# Patient Record
Sex: Male | Born: 1945 | Race: White | Hispanic: No | Marital: Single | Smoking: Current some day smoker
Health system: Southern US, Community
[De-identification: ages and names within clinical notes are randomized; demographics above are authoritative.]

## PROBLEM LIST (undated history)

## (undated) DIAGNOSIS — C801 Malignant (primary) neoplasm, unspecified: Secondary | ICD-10-CM

## (undated) DIAGNOSIS — M549 Dorsalgia, unspecified: Secondary | ICD-10-CM

## (undated) DIAGNOSIS — K219 Gastro-esophageal reflux disease without esophagitis: Secondary | ICD-10-CM

## (undated) DIAGNOSIS — Z923 Personal history of irradiation: Secondary | ICD-10-CM

## (undated) DIAGNOSIS — C7951 Secondary malignant neoplasm of bone: Secondary | ICD-10-CM

## (undated) DIAGNOSIS — I10 Essential (primary) hypertension: Secondary | ICD-10-CM

## (undated) HISTORY — PX: TOTAL HIP ARTHROPLASTY: SHX124

## (undated) HISTORY — PX: OTHER SURGICAL HISTORY: SHX169

## (undated) HISTORY — DX: Secondary malignant neoplasm of bone: C79.51

## (undated) HISTORY — DX: Personal history of irradiation: Z92.3

---

## 2004-12-19 ENCOUNTER — Ambulatory Visit: Payer: Self-pay | Admitting: Internal Medicine

## 2004-12-19 ENCOUNTER — Inpatient Hospital Stay (HOSPITAL_COMMUNITY): Admission: EM | Admit: 2004-12-19 | Discharge: 2004-12-26 | Payer: Self-pay | Admitting: *Deleted

## 2004-12-24 ENCOUNTER — Encounter (INDEPENDENT_AMBULATORY_CARE_PROVIDER_SITE_OTHER): Payer: Self-pay | Admitting: Specialist

## 2005-01-07 ENCOUNTER — Ambulatory Visit: Payer: Self-pay | Admitting: Internal Medicine

## 2005-07-15 ENCOUNTER — Emergency Department (HOSPITAL_COMMUNITY): Admission: EM | Admit: 2005-07-15 | Discharge: 2005-07-15 | Payer: Self-pay | Admitting: Emergency Medicine

## 2005-07-16 ENCOUNTER — Ambulatory Visit: Payer: Self-pay | Admitting: Family Medicine

## 2005-07-22 ENCOUNTER — Ambulatory Visit: Payer: Self-pay | Admitting: *Deleted

## 2005-08-06 ENCOUNTER — Inpatient Hospital Stay (HOSPITAL_COMMUNITY): Admission: EM | Admit: 2005-08-06 | Discharge: 2005-08-13 | Payer: Self-pay | Admitting: Emergency Medicine

## 2005-08-06 ENCOUNTER — Encounter: Payer: Self-pay | Admitting: Vascular Surgery

## 2005-08-10 ENCOUNTER — Encounter (INDEPENDENT_AMBULATORY_CARE_PROVIDER_SITE_OTHER): Payer: Self-pay | Admitting: Specialist

## 2005-08-18 ENCOUNTER — Ambulatory Visit: Payer: Self-pay | Admitting: Family Medicine

## 2005-11-05 ENCOUNTER — Ambulatory Visit: Payer: Self-pay | Admitting: Family Medicine

## 2006-09-28 ENCOUNTER — Ambulatory Visit: Payer: Self-pay | Admitting: Internal Medicine

## 2006-09-28 LAB — CONVERTED CEMR LAB
Albumin: 4.6 g/dL (ref 3.5–5.2)
CO2: 25 meq/L (ref 19–32)
Cholesterol: 172 mg/dL (ref 0–200)
Eosinophils Relative: 1 % (ref 0–5)
Glucose, Bld: 156 mg/dL — ABNORMAL HIGH (ref 70–99)
HCT: 47.9 % (ref 39.0–52.0)
Lymphocytes Relative: 27 % (ref 12–46)
Lymphs Abs: 2.7 10*3/uL (ref 0.7–3.3)
Platelets: 282 10*3/uL (ref 150–400)
Potassium: 4.4 meq/L (ref 3.5–5.3)
RBC: 5.19 M/uL (ref 4.22–5.81)
Sodium: 137 meq/L (ref 135–145)
Total Protein: 7.3 g/dL (ref 6.0–8.3)
Triglycerides: 116 mg/dL (ref <150)
WBC: 9.8 10*3/uL (ref 4.0–10.5)

## 2007-04-24 ENCOUNTER — Inpatient Hospital Stay (HOSPITAL_COMMUNITY): Admission: EM | Admit: 2007-04-24 | Discharge: 2007-05-05 | Payer: Self-pay | Admitting: Emergency Medicine

## 2007-04-28 ENCOUNTER — Ambulatory Visit: Payer: Self-pay | Admitting: Psychiatry

## 2007-05-12 ENCOUNTER — Inpatient Hospital Stay (HOSPITAL_COMMUNITY): Admission: EM | Admit: 2007-05-12 | Discharge: 2007-05-30 | Payer: Self-pay | Admitting: Emergency Medicine

## 2007-05-12 ENCOUNTER — Ambulatory Visit: Payer: Self-pay | Admitting: Emergency Medicine

## 2008-03-13 ENCOUNTER — Encounter (INDEPENDENT_AMBULATORY_CARE_PROVIDER_SITE_OTHER): Payer: Self-pay | Admitting: Adult Health

## 2008-03-13 ENCOUNTER — Ambulatory Visit: Payer: Self-pay | Admitting: Internal Medicine

## 2008-03-13 LAB — CONVERTED CEMR LAB
Cholesterol: 198 mg/dL (ref 0–200)
Eosinophils Relative: 1 % (ref 0–5)
GC Probe Amp, Urine: NEGATIVE
HCT: 46.8 % (ref 39.0–52.0)
Hemoglobin: 15.6 g/dL (ref 13.0–17.0)
Lymphocytes Relative: 21 % (ref 12–46)
Lymphs Abs: 2.1 10*3/uL (ref 0.7–4.0)
Microalb, Ur: 2.03 mg/dL — ABNORMAL HIGH (ref 0.00–1.89)
Monocytes Absolute: 0.9 10*3/uL (ref 0.1–1.0)
PSA: 1.02 ng/mL (ref 0.10–4.00)
Total CHOL/HDL Ratio: 4.1
VLDL: 16 mg/dL (ref 0–40)
WBC: 10.1 10*3/uL (ref 4.0–10.5)

## 2008-03-19 ENCOUNTER — Encounter (INDEPENDENT_AMBULATORY_CARE_PROVIDER_SITE_OTHER): Payer: Self-pay | Admitting: Adult Health

## 2008-03-19 LAB — CONVERTED CEMR LAB
ALT: 16 units/L (ref 0–53)
BUN: 15 mg/dL (ref 6–23)
CO2: 18 meq/L — ABNORMAL LOW (ref 19–32)
Creatinine, Ser: 0.71 mg/dL (ref 0.40–1.50)
Total Bilirubin: 0.3 mg/dL (ref 0.3–1.2)

## 2008-03-20 ENCOUNTER — Ambulatory Visit (HOSPITAL_COMMUNITY): Admission: RE | Admit: 2008-03-20 | Discharge: 2008-03-20 | Payer: Self-pay | Admitting: Internal Medicine

## 2008-03-20 ENCOUNTER — Encounter: Payer: Self-pay | Admitting: Internal Medicine

## 2008-05-22 ENCOUNTER — Ambulatory Visit: Payer: Self-pay | Admitting: Internal Medicine

## 2008-05-22 ENCOUNTER — Encounter (INDEPENDENT_AMBULATORY_CARE_PROVIDER_SITE_OTHER): Payer: Self-pay | Admitting: Adult Health

## 2008-05-22 LAB — CONVERTED CEMR LAB
Eosinophils Absolute: 0.1 10*3/uL (ref 0.0–0.7)
Eosinophils Relative: 1 % (ref 0–5)
HCT: 45.1 % (ref 39.0–52.0)
HDL: 33 mg/dL — ABNORMAL LOW (ref >39)
Hemoglobin: 15.2 g/dL (ref 13.0–17.0)
LDL Cholesterol: 86 mg/dL (ref 0–99)
Lymphs Abs: 2.1 10*3/uL (ref 0.7–4.0)
MCV: 87.7 fL (ref 78.0–100.0)
Monocytes Absolute: 0.9 10*3/uL (ref 0.1–1.0)
Monocytes Relative: 9 % (ref 3–12)
Platelets: 251 10*3/uL (ref 150–400)
Total CHOL/HDL Ratio: 4.2
Triglycerides: 94 mg/dL (ref <150)
VLDL: 19 mg/dL (ref 0–40)
WBC: 10.2 10*3/uL (ref 4.0–10.5)

## 2008-06-08 ENCOUNTER — Emergency Department (HOSPITAL_COMMUNITY): Admission: EM | Admit: 2008-06-08 | Discharge: 2008-06-08 | Payer: Self-pay | Admitting: Emergency Medicine

## 2008-07-21 ENCOUNTER — Observation Stay (HOSPITAL_COMMUNITY): Admission: EM | Admit: 2008-07-21 | Discharge: 2008-07-21 | Payer: Self-pay | Admitting: Emergency Medicine

## 2008-09-18 ENCOUNTER — Ambulatory Visit: Payer: Self-pay | Admitting: Internal Medicine

## 2008-09-18 ENCOUNTER — Encounter (INDEPENDENT_AMBULATORY_CARE_PROVIDER_SITE_OTHER): Payer: Self-pay | Admitting: Adult Health

## 2008-09-18 LAB — CONVERTED CEMR LAB
ALT: 16 units/L (ref 0–53)
Alkaline Phosphatase: 70 units/L (ref 39–117)
Basophils Absolute: 0 10*3/uL (ref 0.0–0.1)
Basophils Relative: 0 % (ref 0–1)
CO2: 25 meq/L (ref 19–32)
Creatinine, Ser: 0.86 mg/dL (ref 0.40–1.50)
Eosinophils Absolute: 0.2 10*3/uL (ref 0.0–0.7)
Glucose, Bld: 67 mg/dL — ABNORMAL LOW (ref 70–99)
MCHC: 32.4 g/dL (ref 30.0–36.0)
MCV: 92.6 fL (ref 78.0–100.0)
Neutro Abs: 7.8 10*3/uL — ABNORMAL HIGH (ref 1.7–7.7)
Neutrophils Relative %: 68 % (ref 43–77)
Pro B Natriuretic peptide (BNP): 29.2 pg/mL (ref 0.0–100.0)
RDW: 14 % (ref 11.5–15.5)
Sodium: 139 meq/L (ref 135–145)
Total Bilirubin: 0.4 mg/dL (ref 0.3–1.2)
Total Protein: 6.5 g/dL (ref 6.0–8.3)

## 2008-10-03 ENCOUNTER — Ambulatory Visit: Payer: Self-pay | Admitting: Internal Medicine

## 2008-10-04 ENCOUNTER — Encounter (INDEPENDENT_AMBULATORY_CARE_PROVIDER_SITE_OTHER): Payer: Self-pay | Admitting: *Deleted

## 2008-10-04 LAB — CONVERTED CEMR LAB
Albumin: 4.5 g/dL (ref 3.5–5.2)
BUN: 42 mg/dL — ABNORMAL HIGH (ref 6–23)
CO2: 30 meq/L (ref 19–32)
Calcium: 10.2 mg/dL (ref 8.4–10.5)
Chloride: 93 meq/L — ABNORMAL LOW (ref 96–112)
Cholesterol: 182 mg/dL (ref 0–200)
Creatinine, Ser: 1.51 mg/dL — ABNORMAL HIGH (ref 0.40–1.50)
Eosinophils Absolute: 0.2 10*3/uL (ref 0.0–0.7)
Eosinophils Relative: 1 % (ref 0–5)
Glucose, Bld: 137 mg/dL — ABNORMAL HIGH (ref 70–99)
HCT: 45.6 % (ref 39.0–52.0)
HDL: 35 mg/dL — ABNORMAL LOW (ref >39)
Hemoglobin: 14.4 g/dL (ref 13.0–17.0)
Lymphocytes Relative: 21 % (ref 12–46)
Lymphs Abs: 2.8 10*3/uL (ref 0.7–4.0)
MCV: 94.4 fL (ref 78.0–100.0)
Monocytes Absolute: 1.3 10*3/uL — ABNORMAL HIGH (ref 0.1–1.0)
Platelets: 309 10*3/uL (ref 150–400)
Potassium: 4.8 meq/L (ref 3.5–5.3)
Total CHOL/HDL Ratio: 5.2
Triglycerides: 245 mg/dL — ABNORMAL HIGH (ref <150)
WBC: 13.5 10*3/uL — ABNORMAL HIGH (ref 4.0–10.5)

## 2008-10-29 ENCOUNTER — Encounter (INDEPENDENT_AMBULATORY_CARE_PROVIDER_SITE_OTHER): Payer: Self-pay | Admitting: Adult Health

## 2008-10-29 ENCOUNTER — Ambulatory Visit: Payer: Self-pay | Admitting: Internal Medicine

## 2008-10-29 LAB — CONVERTED CEMR LAB
BUN: 26 mg/dL — ABNORMAL HIGH (ref 6–23)
CO2: 21 meq/L (ref 19–32)
Calcium: 9.6 mg/dL (ref 8.4–10.5)
Creatinine, Ser: 1.08 mg/dL (ref 0.40–1.50)
Glucose, Bld: 108 mg/dL — ABNORMAL HIGH (ref 70–99)
Sodium: 135 meq/L (ref 135–145)

## 2008-11-29 ENCOUNTER — Ambulatory Visit: Payer: Self-pay | Admitting: Cardiology

## 2008-11-29 ENCOUNTER — Inpatient Hospital Stay (HOSPITAL_COMMUNITY): Admission: EM | Admit: 2008-11-29 | Discharge: 2008-11-30 | Payer: Self-pay | Admitting: Emergency Medicine

## 2008-11-29 ENCOUNTER — Ambulatory Visit: Payer: Self-pay | Admitting: Infectious Diseases

## 2008-11-30 ENCOUNTER — Encounter: Payer: Self-pay | Admitting: Infectious Diseases

## 2008-12-10 ENCOUNTER — Encounter (INDEPENDENT_AMBULATORY_CARE_PROVIDER_SITE_OTHER): Payer: Self-pay | Admitting: Adult Health

## 2008-12-10 ENCOUNTER — Ambulatory Visit: Payer: Self-pay | Admitting: Internal Medicine

## 2008-12-10 LAB — CONVERTED CEMR LAB
ALT: 26 units/L (ref 0–53)
Alkaline Phosphatase: 79 units/L (ref 39–117)
CO2: 24 meq/L (ref 19–32)
Creatinine, Ser: 1.19 mg/dL (ref 0.40–1.50)
Sodium: 135 meq/L (ref 135–145)
Total Bilirubin: 0.3 mg/dL (ref 0.3–1.2)

## 2009-03-12 ENCOUNTER — Ambulatory Visit: Payer: Self-pay | Admitting: Family Medicine

## 2009-03-14 ENCOUNTER — Encounter (INDEPENDENT_AMBULATORY_CARE_PROVIDER_SITE_OTHER): Payer: Self-pay | Admitting: Adult Health

## 2009-03-14 ENCOUNTER — Ambulatory Visit: Payer: Self-pay | Admitting: Internal Medicine

## 2009-03-14 LAB — CONVERTED CEMR LAB
Albumin: 4.5 g/dL (ref 3.5–5.2)
BUN: 25 mg/dL — ABNORMAL HIGH (ref 6–23)
CO2: 25 meq/L (ref 19–32)
Glucose, Bld: 51 mg/dL — ABNORMAL LOW (ref 70–99)
Potassium: 4.4 meq/L (ref 3.5–5.3)
Sodium: 135 meq/L (ref 135–145)
Total Bilirubin: 0.3 mg/dL (ref 0.3–1.2)
Total Protein: 6.9 g/dL (ref 6.0–8.3)

## 2009-03-25 IMAGING — RF DG NASO G TUBE PLC W/FL W/RAD
1 series · 1 of 1 positions shown · non-contrast
Comparison: Abdomen radiograph dated 05/16/2007.

CLINICAL DATA: Inability to place a feeding tube on the floor.

NG/OG TUBE BY JOELSON DANILO

[Series 1: run · 1 of 1 slices shown]
[im 1/1]
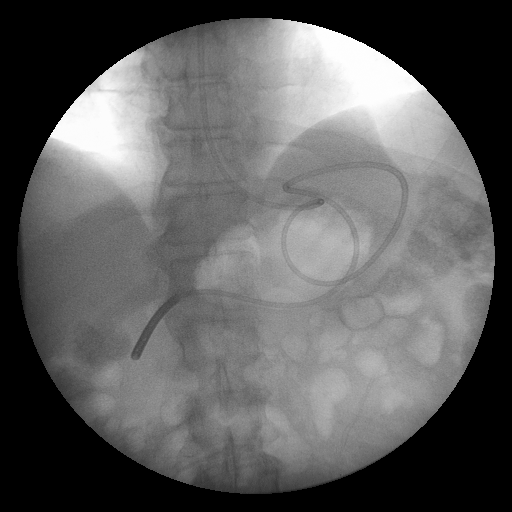

[1 of 1 positions shown; findings below may reference images not displayed]

Description: Under fluoroscopic guidance, a feeding tube was passed
through the patients right nostril and into the stomach.  The tube
was advanced into the duodenum with its tip left in the second
portion of the duodenum.  The tube could not be advanced further
due to the coiling of the tube in the gastric fundus.  Multiple
coils of tube were left in the fundus.
IMPRESSION: Feeding tube placed with its tip in the second portion of the
duodenum.  There were additional coils of tubing left in the
stomach, to allow peristaltic advancement into the proximal
jejunum.  The tube is ready to use for feeding.

## 2009-04-16 ENCOUNTER — Ambulatory Visit: Payer: Self-pay | Admitting: Internal Medicine

## 2009-05-06 ENCOUNTER — Encounter (INDEPENDENT_AMBULATORY_CARE_PROVIDER_SITE_OTHER): Payer: Self-pay | Admitting: Adult Health

## 2009-05-06 ENCOUNTER — Ambulatory Visit: Payer: Self-pay | Admitting: Internal Medicine

## 2009-05-06 LAB — CONVERTED CEMR LAB: Microalb, Ur: 2.09 mg/dL — ABNORMAL HIGH (ref 0.00–1.89)

## 2009-07-09 ENCOUNTER — Encounter (INDEPENDENT_AMBULATORY_CARE_PROVIDER_SITE_OTHER): Payer: Self-pay | Admitting: Adult Health

## 2009-07-09 ENCOUNTER — Ambulatory Visit: Payer: Self-pay | Admitting: Internal Medicine

## 2009-07-09 LAB — CONVERTED CEMR LAB
ALT: 24 units/L (ref 0–53)
AST: 16 units/L (ref 0–37)
Creatinine, Ser: 0.97 mg/dL (ref 0.40–1.50)
LDL Cholesterol: 48 mg/dL (ref 0–99)
PSA: 1.03 ng/mL (ref 0.10–4.00)
Sodium: 136 meq/L (ref 135–145)
Total Bilirubin: 0.3 mg/dL (ref 0.3–1.2)
Total CHOL/HDL Ratio: 3.6
VLDL: 34 mg/dL (ref 0–40)
Vit D, 25-Hydroxy: 28 ng/mL — ABNORMAL LOW (ref 30–89)

## 2010-04-30 LAB — BASIC METABOLIC PANEL
CO2: 28 mEq/L (ref 19–32)
Calcium: 9.5 mg/dL (ref 8.4–10.5)
Chloride: 96 mEq/L (ref 96–112)
Creatinine, Ser: 0.98 mg/dL (ref 0.4–1.5)
GFR calc Af Amer: >60 mL/min (ref >60)
Glucose, Bld: 137 mg/dL — ABNORMAL HIGH (ref 70–99)

## 2010-04-30 LAB — RAPID URINE DRUG SCREEN, HOSP PERFORMED
Amphetamines: NOT DETECTED
Barbiturates: NOT DETECTED
Benzodiazepines: NOT DETECTED
Opiates: NOT DETECTED

## 2010-04-30 LAB — CBC
MCHC: 34.8 g/dL (ref 30.0–36.0)
MCV: 89.4 fL (ref 78.0–100.0)
RBC: 4.57 MIL/uL (ref 4.22–5.81)
RDW: 13 % (ref 11.5–15.5)

## 2010-04-30 LAB — DIFFERENTIAL
Basophils Absolute: 0 10*3/uL (ref 0.0–0.1)
Basophils Relative: 0 % (ref 0–1)
Eosinophils Absolute: 0.1 10*3/uL (ref 0.0–0.7)
Monocytes Absolute: 0.8 10*3/uL (ref 0.1–1.0)
Monocytes Relative: 9 % (ref 3–12)
Neutrophils Relative %: 75 % (ref 43–77)

## 2010-04-30 LAB — LIPID PANEL
Cholesterol: 147 mg/dL (ref 0–200)
LDL Cholesterol: 93 mg/dL (ref 0–99)
Triglycerides: 146 mg/dL (ref <150)

## 2010-04-30 LAB — RENAL FUNCTION PANEL
BUN: 14 mg/dL (ref 6–23)
CO2: 29 mEq/L (ref 19–32)
Chloride: 96 mEq/L (ref 96–112)
Creatinine, Ser: 0.87 mg/dL (ref 0.4–1.5)
GFR calc Af Amer: >60 mL/min (ref >60)
GFR calc non Af Amer: >60 mL/min (ref >60)
Glucose, Bld: 106 mg/dL — ABNORMAL HIGH (ref 70–99)

## 2010-04-30 LAB — CARDIAC PANEL(CRET KIN+CKTOT+MB+TROPI)
CK, MB: 2.2 ng/mL (ref 0.3–4.0)
Relative Index: 1.9 (ref 0.0–2.5)
Troponin I: 0.01 ng/mL (ref 0.00–0.06)

## 2010-04-30 LAB — URINALYSIS, ROUTINE W REFLEX MICROSCOPIC
Bilirubin Urine: NEGATIVE
Glucose, UA: NEGATIVE mg/dL
Hgb urine dipstick: NEGATIVE
Ketones, ur: NEGATIVE mg/dL
Protein, ur: NEGATIVE mg/dL
Urobilinogen, UA: 0.2 mg/dL (ref 0.0–1.0)

## 2010-04-30 LAB — GLUCOSE, CAPILLARY
Glucose-Capillary: 140 mg/dL — ABNORMAL HIGH (ref 70–99)
Glucose-Capillary: 200 mg/dL — ABNORMAL HIGH (ref 70–99)

## 2010-04-30 LAB — CK TOTAL AND CKMB (NOT AT ARMC): CK, MB: 2.6 ng/mL (ref 0.3–4.0)

## 2010-04-30 LAB — HEMOGLOBIN A1C
Hgb A1c MFr Bld: 8.1 % — ABNORMAL HIGH (ref 4.6–6.1)
Mean Plasma Glucose: 186 mg/dL

## 2010-05-05 LAB — CBC
MCV: 87.5 fL (ref 78.0–100.0)
RBC: 3.57 MIL/uL — ABNORMAL LOW (ref 4.22–5.81)
WBC: 9.3 10*3/uL (ref 4.0–10.5)

## 2010-05-05 LAB — DIFFERENTIAL
Basophils Absolute: 0.1 10*3/uL (ref 0.0–0.1)
Basophils Relative: 1 % (ref 0–1)
Eosinophils Absolute: 0.2 10*3/uL (ref 0.0–0.7)
Eosinophils Relative: 2 % (ref 0–5)
Lymphocytes Relative: 23 % (ref 12–46)

## 2010-05-05 LAB — T4, FREE: Free T4: 0.92 ng/dL (ref 0.80–1.80)

## 2010-05-05 LAB — BASIC METABOLIC PANEL
BUN: 3 mg/dL — ABNORMAL LOW (ref 6–23)
CO2: 27 mEq/L (ref 19–32)
Calcium: 8.5 mg/dL (ref 8.4–10.5)
GFR calc non Af Amer: >60 mL/min (ref >60)
Glucose, Bld: 186 mg/dL — ABNORMAL HIGH (ref 70–99)
Potassium: 3.4 mEq/L — ABNORMAL LOW (ref 3.5–5.1)
Sodium: 138 mEq/L (ref 135–145)

## 2010-05-05 LAB — FERRITIN: Ferritin: 400 ng/mL — ABNORMAL HIGH (ref 22–322)

## 2010-05-05 LAB — URINALYSIS, ROUTINE W REFLEX MICROSCOPIC
Glucose, UA: NEGATIVE mg/dL
Hgb urine dipstick: NEGATIVE
Protein, ur: NEGATIVE mg/dL
Specific Gravity, Urine: 1.002 — ABNORMAL LOW (ref 1.005–1.030)

## 2010-05-05 LAB — LIPID PANEL
Cholesterol: 123 mg/dL (ref 0–200)
HDL: 28 mg/dL — ABNORMAL LOW (ref >39)
LDL Cholesterol: 69 mg/dL (ref 0–99)
Total CHOL/HDL Ratio: 4.4 RATIO

## 2010-05-05 LAB — COMPREHENSIVE METABOLIC PANEL
ALT: 21 U/L (ref 0–53)
AST: 20 U/L (ref 0–37)
CO2: 26 mEq/L (ref 19–32)
Chloride: 100 mEq/L (ref 96–112)
Creatinine, Ser: 0.71 mg/dL (ref 0.4–1.5)
GFR calc Af Amer: >60 mL/min (ref >60)
GFR calc non Af Amer: >60 mL/min (ref >60)
Total Bilirubin: 0.4 mg/dL (ref 0.3–1.2)

## 2010-05-05 LAB — IRON AND TIBC
Saturation Ratios: 20 % (ref 20–55)
TIBC: 250 ug/dL (ref 215–435)

## 2010-05-05 LAB — TSH: TSH: 0.407 u[IU]/mL (ref 0.350–4.500)

## 2010-05-05 LAB — GLUCOSE, CAPILLARY
Glucose-Capillary: 125 mg/dL — ABNORMAL HIGH (ref 70–99)
Glucose-Capillary: 150 mg/dL — ABNORMAL HIGH (ref 70–99)

## 2010-05-05 LAB — FOLATE: Folate: 11.8 ng/mL

## 2010-05-05 LAB — RETICULOCYTES
RBC.: 3.85 MIL/uL — ABNORMAL LOW (ref 4.22–5.81)
Retic Count, Absolute: 61.6 10*3/uL (ref 19.0–186.0)

## 2010-05-05 LAB — LIPASE, BLOOD: Lipase: 42 U/L (ref 11–59)

## 2010-05-06 LAB — COMPREHENSIVE METABOLIC PANEL
Alkaline Phosphatase: 79 U/L (ref 39–117)
BUN: 18 mg/dL (ref 6–23)
Calcium: 9.3 mg/dL (ref 8.4–10.5)
Creatinine, Ser: 1.9 mg/dL — ABNORMAL HIGH (ref 0.4–1.5)
Glucose, Bld: 155 mg/dL — ABNORMAL HIGH (ref 70–99)
Total Protein: 6.8 g/dL (ref 6.0–8.3)

## 2010-05-06 LAB — DIFFERENTIAL
Basophils Relative: 0 % (ref 0–1)
Lymphocytes Relative: 13 % (ref 12–46)
Lymphs Abs: 1.9 10*3/uL (ref 0.7–4.0)
Monocytes Relative: 10 % (ref 3–12)
Neutro Abs: 11.3 10*3/uL — ABNORMAL HIGH (ref 1.7–7.7)
Neutrophils Relative %: 76 % (ref 43–77)

## 2010-05-06 LAB — POCT I-STAT, CHEM 8
BUN: 20 mg/dL (ref 6–23)
Chloride: 98 mEq/L (ref 96–112)
Glucose, Bld: 152 mg/dL — ABNORMAL HIGH (ref 70–99)
HCT: 46 % (ref 39.0–52.0)
Potassium: 3.2 mEq/L — ABNORMAL LOW (ref 3.5–5.1)

## 2010-05-06 LAB — CBC
HCT: 43.5 % (ref 39.0–52.0)
Hemoglobin: 15 g/dL (ref 13.0–17.0)
MCHC: 34.5 g/dL (ref 30.0–36.0)
MCV: 87.2 fL (ref 78.0–100.0)
Platelets: 218 10*3/uL (ref 150–400)
RDW: 12.8 % (ref 11.5–15.5)

## 2010-05-06 LAB — LACTIC ACID, PLASMA: Lactic Acid, Venous: 2.6 mmol/L — ABNORMAL HIGH (ref 0.5–2.2)

## 2010-06-10 NOTE — H&P (Signed)
NAMEHAYDAN, MANSOURI                ACCOUNT NO.:  [id number]   MEDICAL RECORD NO.:  [id number]          PATIENT TYPE:  INP   LOCATION:  1845                         FACILITY:  [facility]   PHYSICIAN:  Beckey Rutter, MD  DATE OF BIRTH:  19-Feb-1945   DATE OF ADMISSION:  05/12/2007  DATE OF DISCHARGE:                              HISTORY & PHYSICAL   PRIMARY CARE PHYSICIAN:  [facility].  The patient is coming from a  nursing home, currently.   CHIEF COMPLAINT:  Altered mentation.   HISTORY OF PRESENT ILLNESS:  This is a 65 year old male brought in from  skilled nursing facility, because of altered mental status.  The history  could not be obtained from the patient, because he is non-communicable  and totally confused currently, though as per the nursing home  documentation, the patient's mentation has changed.  The patient went to  the skilled nursing facility on April 6, after admission for hip  replacement done by Dr. Doneen Poisson on April 24, 2007, here in  [facility].   PAST MEDICAL HISTORY:  Past medical history significant for chronic  alcohol abuse and type 2 diabetes.   SOCIAL HISTORY:  The patient lives in a skilled nursing facility, has  history of alcohol abuse and alcoholism.  The patient was discharge to  the skilled nursing facility on Librium, the p.m. protocol.   FAMILY HISTORY:  Noncontributory.   MEDICATIONS:  Includes:  1. Ativan orally.  2. Coumadin.  3. Doxycycline 10 mg p.o. b.i.d.  4. Three ensure shakes with meal.  5. Sliding scale insulin.  6. Multivitamins.  7. Thiamine 100 mg daily.  8. Coumadin every night, dose adjusted as per his INR between 2.5.  9. Ativan 1 mg p.o. q.6 h. p.r.n.  10.Vicodin 5/500 1 to 2 p.o.  11.Robaxin 500 mg p.o. every 6 hours for spasm.  12.Senokot 1 mg p.o. daily.   REVIEW OF SYSTEMS:  A 12-point review of system is noncontributory.   EXAMINATION:  GENERAL:  The patient is lying in the bed, not in  acute  distress.  NECK:  No JVD.  The patient has generalized rigidity.  PRECORDIUM:  First and second heart sounds distally audible.  LUNGS:  Bilateral decreased air entry with decreased effort.  The  patient, again, is not cooperative with exam.  LOWER EXTREMITIES:  No lower extremity edema.   LABS AND X-RAY:  When CT head without contrast done today.  Impression  with reading:  1. No acute intracranial process.  Exam limited by motion.  2. Wallace Cullens ventricular hypodensities, consistent with small-vessel      ischemia.  3. 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  Chest x-ray      was showing minimal basilar atelectasis, left greater than right,      some of the density in the left base is probably secondary to      extensive material overlying the left lower chest.  Urinalysis showing amber, cloudy urine with negative nitrate and  negative leukocyte esterase.  Sodium is 147, potassium 4.4, chloride  111, bicarb 25,  glucose 294, BUN 28, creatinine 1.09, calcium 7.1, AST  is 38, ALT 48.  Lactic acid is 1.9.  Acetone in the blood is negative.  PTT is 30.  White blood count is 13.2, hemoglobin is 14.8, hematocrit is  43.5, and platelet is 337.   ASSESSMENT AND PLAN:  A 65 year old with altered mental status, likely  to multiple medical problems.  The patient could have pneumonia, which  is evident on the x-ray, could also have confusion related to his  alcohol problem.   PLAN:  1. The patient will be admitted for further assessment and management.  2. Will obtain MRI when the patient is stable and can stay still for      the test to be done.  3. For the pneumonia, will start the patient on Rocephin and      Zithromax.  4. For the alcohol problem, will continue the patient on intravenous      thiamine, until he is able to take p.o., then will continue him on      folic acid as well.  The patient will be put under CIWA watch and      will put him on seizure precautions, as  well as Ativan p.r.n.  5. Hypernatremia.  This is secondary to the state of dehydration.      Will re-hydrate the patient with intravenous fluids.  6. For DVT prophylaxis, will start the patient on Lovenox.  7. For GI prophylaxis, we will consider Protonix.      Beckey Rutter, MD  Electronically Signed     EME/MEDQ  D:  05/12/2007  T:  05/12/2007  Job:  [id number]

## 2010-06-10 NOTE — Discharge Summary (Signed)
Bradley [name], Bradley [name]                ACCOUNT NO.:  [id number]   MEDICAL RECORD NO.:  [id number]          PATIENT TYPE:  INP   LOCATION:  5531                         FACILITY:  [facility]   PHYSICIAN:  Nelda Bucks, MD DATE OF BIRTH:  July 12, 1945   DATE OF ADMISSION:  05/12/2007  DATE OF DISCHARGE:  05/27/2007                               DISCHARGE SUMMARY   DISCHARGE DIAGNOSES:  1. Acute respiratory failure secondary to pneumonia and mucus      plugging.  2. Status post influenza infection, now completed a full dose Tamiflu.  3. Toxic metabolic encephalopathy versus dementia.  4. Dysphagia  5. Debility and failure to thrive.   PROCEDURES:  1. Oroendotracheal tube placed May 16, 2007, removed May 17, 2007.  2. Right peripherally inserted central venous catheter placed May 16, 2007.  3. Right lower lobe bronchoscopy on May 16, 2007.  4. Lumbar puncture on May 25, 2007.   LABORATORY DATA:  CSF culture on May 25, 2007 currently pending.  Date  May 25, 2007, sodium 134, potassium 4, chloride 98, CO2 30, glucose  229, BUN 11, creatinine 0.57.  PTT 31, PT/INR 13/1.0.  White blood cell  count 9.8, hemoglobin 11.1, hematocrit 33.1, platelet count 248.   BRIEF HISTORY:  A 65 year old male patient with history of diabetes type  2, alcohol abuse, status post total hip replacement on April 24, 2007  and had been sent for recovery to nursing home on May 12, 2007 but was  quickly readmitted on May 12, 2007 with altered mental status, mild  hypernatremia, and pneumonia.  He abruptly deteriorated on May 16, 2007 with emergent respiratory distress.  He was intubated.  Chest x-ray  demonstrated complete right lung collapse.  He was transferred to the  intensive care where the intensive care service was asked to evaluate.   HOSPITAL COURSE:  Problem 1.  Resolved respiratory failure secondary to  mucus plugging, pneumonia, and positive influenza A antigen.  Bradley [name]  underwent fiberoptic bronchoscopy of the right lobe to improve  oxygenation.  This resulted in improved aeration of the chest x-ray.  Cultures were obtained which demonstrated normal oropharyngeal-type  flora.  He was successfully extubated on May 17, 2007.  Given the  rapid influenza antigen was positive, he completed a 5-day course of  Tamiflu.  He was on droplet isolations during this time.  Upon time of  pulmonary critical care sign off, from a pulmonary standpoint, he is now  on room air.  Saturations are 94-95% with a good productive cough;  however, Bradley [name] remains a high risk for recurrent aspiration given  his altered mental status.   Problem 2.  Toxic metabolic encephalopathy versus dementia.  As  previously mentioned, Bradley [name] had presented with altered mental  status from the nursing home.  He did have a CT of head that  demonstrated right greater than left frontal ischemic changes of the  brain.  Dr. Anne Hahn had been consulted in the past.  Based on these  recommendations, Bradley [name] underwent lumbar puncture on May 25, 2007,  and at time of dictation, currently HSV PCR, fungal, and viral cultures  are pending.  Also based on these recommendations, he is pending an MRI  of the brain as well.  Upon time upon time of critical care sign off,  delirium/dementia appeared to be Bradley [name]'  major barrier to hospital  discharge.  Upon time of dictation, his delirium is primarily being  managed with scheduled Risperdal, thiamine, and folic acid  supplementation.  Would consider followup neurology consultation should  MRI and LP be nondiagnostic.   Problem 3.  Dysphagia.  Bradley [name] has had documented dysphagia by  modified barium swallow.  He continues to receive his nutrition via  nasal feeding tube.  He is pending are reexamination by speech language  pathology today on May 26, 2007.  Following the results, if he can  safely swallow, then could conceivably be discharged  back to nursing  home in the next 48 hours following observation or would require PEG  tube for safe feeding.  Suspect he will require PEG tube.   Problem 4.  Debility and failure to thrive.  For this, Bradley [name] will  require nursing home placement.  For all other details, see history and  physical by Dr. Tamsen Roers on May 12, 2007.   INSTRUCTIONS UPON TIME OF PULMONARY CRITICAL CARE SIGN OFF:  1. Diet is currently Jevity infused at 65 mL an hour.  2. Ventolin 2 puffs q.6h.  3. Mycelex 10 mg tablet 5 times a day.  4. Folic acid 1 mg daily via tube.  5. Protonix 40 mg via tube q.h.s.  6. Risperdal 2 mg via tube q.12h.  7. Thiamine 100 mg via tube daily.  8. Tylenol 650 mg p.o. q.4h. p.r.n.  9. Haldol 5 mg IV q.3h. p.r.n.   DISPOSITION:  Bradley [name] has met maximum benefit from critical care  service consultation.  He is now stable for transfer back to internal  medicine service.  He will be assumed by [facility] Internal Medicine  Service Team B effective May 27, 2007.  Again, major barriers to  discharge at this point remain his delirium and dysphagia.  Ongoing  workup is in process.      Zenia Resides, NP      Nelda Bucks, MD  Electronically Signed    PB/MEDQ  D:  05/26/2007  T:  05/26/2007  Job:  [id number]

## 2010-06-10 NOTE — Consult Note (Signed)
Bradley, [name] NO.:  [name]   MEDICAL RECORD NO.:  [id number]           PATIENT TYPE:   LOCATION:                                 FACILITY:   PHYSICIAN:  Marlan Palau, M.D.  DATE OF BIRTH:  1945-08-05   DATE OF CONSULTATION:  DATE OF DISCHARGE:                                 CONSULTATION   HISTORY OF PRESENT ILLNESS:  Bradley [name] is a 65 year old white male  born on [birth year], with a history of alcoholism and diabetes.  The  patient was admitted at this point with presumed pneumonia.  The patient  has undergone antibiotic treatment for this, IV fluid hydration, but has  not improved with his mental status.  The patient recently has had a hip  fracture and was discharged from [facility] on May 02, 2007.  The  patient got progressively confused at an extended care facility and was  brought back to this hospital for further evaluation.  Initial CT scan  of the brain shows no acute process, but there was some right greater  than left frontal ischemic changes that were chronic.  Neurology was  asked to see this patient due to the persistent alteration in mental  status.   PAST MEDICAL HISTORY:  Significant for:  1. Altered mental status.  2. Pneumonia during this admission.  3. History of alcohol abuse.  4. Diabetes.  5. Left hip fracture, status post surgery recently.  6. 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  7. History of suicidal ideation.  8. COPD.  9. Peripheral neuropathy.  10.Hypertension.   ALLERGIES:  The patient has no known allergies.   He was smoking a pack of cigarettes a day in the past.  Does have a  history of significant alcohol abuse.   CURRENT MEDICATIONS:  At this time include,  1. Zithromax 500 mg q.12 h.  2. Rocephin 1 g q.24 h.  3. Lovenox 40 mg subcu daily.  4. Haldol 5 mg if needed.  5. Protonix 40 mg nightly.  6. Vitamin B 100 mg IV daily.  7. Morphine if needed.   SOCIAL HISTORY:   This patient is homeless and has been in the extended  care facility after hip fracture.  The patient is an unemployed.   FAMILY MEDICAL HISTORY:  Unobtainable.  According to old records, the  patient's mother died from cancer of some type.  Father died in a motor  vehicle accident.   REVIEW OF SYSTEMS:  Cannot be obtained.  The patient will grunt and  moan, but will not verbalize.   PHYSICAL EXAMINATION:  VITAL SIGNS:  Blood pressure is currently 129/70,  heart rate 105, respiratory 22, and temperature 99.7.  GENERAL:  This patient is a thin, sedated white male who is unable to  follow commands.  HEAD: Atraumatic.  EYES: Pupils are round and reactive to light.  Disks are soft and flat  bilaterally.  NECK: Roughly supple.  No carotid bruits noted.  RESPIRATORY: Relatively clear.  CARDIOVASCULAR: Examination reveals regular rate and rhythm.  No obvious  murmurs or rubs noted.  EXTREMITIES: Reveal distal amputation of the right foot.  No significant  edema.  NEUROLOGIC EXAMINATION: Cranial nerves as above.  Facial asymmetry is  present.  The patient has full extraocular movements laterally with  blink to threat bilaterally.  He will have symmetric grimace.  He will  not really follow commands.  Otherwise, the patient will not state his  name, only grunt and moan to pain.  The patient clearly has more  prominent drift with the left arm than the right when the arms are  elevated.  Will not cooperative for cerebellar testing.  Any  manipulation of the left leg results in significant pain.  Both legs  were kept in flexion.  The patient will not move either leg very well.  The patient has some response to pain stimulation on all fours in a  fairly symmetric fashion.  Deep tendon reflexes depressed.  Toe on the  left is neutral.  The patient cannot be ambulated.   LABORATORY VALUES:  Notable for blood gas with pH of 7.47, pCO2 of 31.2,  O2 of 79.6, and bicarb of 22.4.  White count of  11.3, hemoglobin of  11.8, hematocrit of 34.7, MCV of 86.9, and platelets of 273.  Sodium  137, potassium 3.0, chloride of 104, CO2 25, glucose of 137, BUN of 6,  creatinine 0.76, calcium 8.2, TSH of 1.829, B12 level of 1246, and RPR  nonreactive.  Urinalysis reveals specific gravity of 1.037, pH of 5.0,  100 mg per deciliter glucose, 30 mg per deciliter protein, 0-2 red cells  and white cells.   CT of the head is as above.   IMPRESSION:  1. Altered mental status.  2. History of alcohol abuse.  3. Recent left hip fracture for pneumonia.   This patient certainly could have a toxic metabolic encephalopathy  associated with pneumonia, but the patient does have asymmetric drift to  the arms, left a bit more prominent.  The patient does have right  greater than left frontal ischemic change of the brain, which could  explain this, but need to exclude another unrecognized cerebrovascular  event.  The patient has been intermittently sedated and agitated during  this admission.  The patient is too far out from his initial  hospitalization to consider alcohol withdrawal and has been getting  thiamine on a regular basis.   PLAN:  1. Repeat CT scans.  This patient could not physically get into the      MRI scan due to flexion of the legs.  2. EEG study is pending.  3. Check blood work for ammonia level.  4. Consider lumbar puncture depending upon the results above.  Follow      the patient's clinical course while in house.     Marlan Palau, M.D.  Electronically Signed    CKW/MEDQ  D:  05/15/2007  T:  05/16/2007  Job:  [id number]

## 2010-06-10 NOTE — Procedures (Signed)
EEG NUMBER:  [id number].   REQUESTING PHYSICIAN:  Beckey Rutter, M.D.   ATTENDING PHYSICIAN:  [facility].   CURRENT HISTORY:  A 65 year old man with confusion.  EEG is performed  for evaluation.   DESCRIPTION:  The background rhythm in this tracing is seen only in  brief fragments and is a posterior dominant theta rhythm of  approximately 7-8 Hz which appears intermittently.  More commonly, the  record consists of diffuse low amplitude theta rhythms of 5-7 Hz, often  in the midst of sleep architecture as evidenced by the appearance of  vertex waves and occasional sleep spindles.  No focal slowing is noted,  and no epileptiform discharges are seen.  Photic stimulation and  hyperventilation are not performed.  Single channel devoted to EKG  revealed sinus rhythm throughout with a rate of approximately 90 beats  per minute.   CONCLUSION:  Abnormal study due to the presence of diffuse slowing of  the background rhythms, in excess of what would be expected for normal  sleep, findings suggested diffuse widespread cerebral dysfunction  consistent with encephalopathic and/or demented state.  No focal slowing  is noted, and no epileptiform discharges are seen.      Michael L. Thad Ranger, M.D.  Electronically Signed     ZOX:WRUE  D:  05/16/2007 18:36:07  T:  05/16/2007 21:52:36  Job #:  [id number]

## 2010-06-10 NOTE — H&P (Signed)
Bradley [name], Bradley [name]                ACCOUNT NO.:  [id number]   MEDICAL RECORD NO.:  [id number]          PATIENT TYPE:  INP   LOCATION:  6712                         FACILITY:  [facility]   PHYSICIAN:  Vania Rea, M.D. DATE OF BIRTH:  [birth year]   DATE OF ADMISSION:  07/21/2008  DATE OF DISCHARGE:                              HISTORY & PHYSICAL   PRIMARY CARE PHYSICIAN:  [facility].   CHIEF COMPLAINT:  Sudden weakness of the legs yesterday afternoon.   HISTORY OF PRESENT ILLNESS:  This is a 65 year old Caucasian gentleman  with history of diabetes and probable alcohol abuse, who lives alone and  reports that ever since he had his hip replacement about a year ago and  with subsequent 1-year skilled nursing facility placement, he has  suffered with pains in his lower extremities and recurrent falls.  The  patient denies excess alcohol use, says he only drinks at most 3-4 beers  on a Friday and sometimes he goes weeks without drinking.  However, he  did drink 4 beers yesterday and while walking across his kitchen his  legs gave way and he fell.  The patient says now he is having pain in  both legs and also in his right knee and has difficulty walking.  He  does take insulin for diabetes, as well as other medications, but cannot  remember the name of the medications.  He is not sure if he is on a  diuretic.  In any event, the patient was brought to the emergency room,  was evaluated and was found to have very low serum potassium, as well as  weakness in his legs and the hospitalist service was called to assist  with management.   The patient denies fever, cough or cold.  He denies chest pains or  shortness of breath.  He denies any numbness or tingling in the feet.  He says his blood sugar is well controlled and when he checked it  yesterday morning it was 130.   The patient is somewhat irritable and keeps blaming all of his problems  on the hospital since he had his left hip  replacement a year ago, feels  he was confined to a nursing home unnecessarily and is not sure he is  going to be helped by this visit.  Because of his history, he  specifically denies fecal incontinence or urinary incontinence on  questioning.  However, he has been incontinent of stool in the emergency  room and emergency room notes report that he was incontinent of stool at  home, and he says that this was because he was unable to get to the  bathroom.   PAST MEDICAL HISTORY:  1. Diabetes.  2. History of alcohol abuse.   PAST SURGICAL HISTORY:  1. 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  2. Left hip replacement in March 2009.   MEDICATIONS:  Some type of insulin.  Other medications unknown.   ALLERGIES:  NO KNOWN DRUG ALLERGIES.   SOCIAL HISTORY:  Lives alone.  Smokes 1 pack per day since age 53.  The  chart indicates that he is a heavy alcohol abuser, however, he denies  this.  No evidence of illicit drug use.   FAMILY HISTORY:  The patient says both parents have died.  He has no  brothers.  He is estranged from his sisters and he has no knowledge of  family medical problems.  He has no children.   REVIEW OF SYSTEMS:  On a 10-point review of systems other than noted  above was unremarkable.   PHYSICAL EXAMINATION:  GENERAL:  Irritable middle-aged Caucasian  gentleman lying on the stretcher complaining of pain in his legs.  VITAL SIGNS:  Temperature 99.2, pulse 100, respirations 20, blood  pressure 102/55.  He is saturating at 96% on room air.  HEENT:  His pupils are round and equal.  Mucous membranes pink and  anicteric.  NECK:  No cervical lymphadenopathy or thyromegaly.  No jugulovenous  distention.  CHEST:  He has occasional rhonchi and prolonged expiration.  CARDIOVASCULAR:  Regular rhythm.  No murmur heard.  ABDOMEN:  Soft and nontender.  There are no masses.  EXTREMITIES:  He has 1+ edema of the left lower extremity, trace edema  of the right.   He has arthritic deformities of both knees and he is  status post right forefoot amputation.  Dorsalis pedis pulses are 2+  bilaterally.  The feet are very warm.  SKIN:  Warm and dry.  No ulcerations noted.  CENTRAL NERVOUS SYSTEM:  Cranial nerves II through XII are grossly  intact.  There is no abnormality detected in the upper extremity.  In  the lower extremity difficult to assess because of pains and  uncooperativeness.  He has brisk deep tendon reflex at the knee on the  left while I could not elicit it on the right because of pain and  stiffness of the knee.  Both legs appear to be tender to touch.  There  did not appear to be obvious weakness of the lower extremities.   LABORATORY DATA:  His white count is 9.3, hemoglobin 10.5, MCV 87.5,  platelets 227 with a normal differential on his white count.  His  complete metabolic panel; sodium is 136, potassium 2.8, chloride 100,  CO2 of 26, glucose 130, BUN 4, creatinine 0.7.  His total protein is 5.3  and albumin 3.0.  His liver functions are normal and his chemistries  otherwise normal.  His serum lactic acid is elevated at 2.9.  Blood  alcohol level is elevated at 115.  His lipase is normal at 42.  Fecal  occult blood is negative.  His PT and INR are normal at 13.2 and 1.0.  His portable chest x-ray reveals mild peribronchial thickening with no  acute  abnormalities.  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   ASSESSMENT:  1. Hypokalemia of unclear etiology, possibly related to diuretic use.  2. Acute leg pain and weakness with a history of fecal incontinence.  3. Diabetes type 2.  4. History of alcohol abuse.  5. Tobacco abuse.   PLAN:  We will correct this gentleman's potassium since it is a possible  cause of his leg weakness and will check his thyroid functions since  this could be associated hypokalemic periodic  paralysis.  In view of his  fecal incontinence, we will get an MRI of his brain and cervical spine  with and without contrast.  Other plans as per orders.  Vania Rea, M.D.  Electronically Signed     LC/MEDQ  D:  07/21/2008  T:  07/21/2008  Job:  [id number]   cc:   Loman Brooklyn, MD

## 2010-06-10 NOTE — Discharge Summary (Signed)
Bradley [name], Bradley [name]                ACCOUNT NO.:  [id number]   MEDICAL RECORD NO.:  [id number]          PATIENT TYPE:  INP   LOCATION:  5531                         FACILITY:  [facility]   PHYSICIAN:  Isidor Holts, M.D.  DATE OF BIRTH:  [birth year]   DATE OF ADMISSION:  05/12/2007  DATE OF DISCHARGE:                               DISCHARGE SUMMARY   ADDENDUM:   DATE OF DISCHARGE:  To be determined.   PRIMARY MEDICAL DOCTOR:  [facility].  The patient is unassigned to [place].   DISCHARGE DIAGNOSES:  As in discharge summary dated May 26, 2007, by  Dr. Rory Percy.   For procedures and detailed hospital course, refer to above-mentioned  interim discharge summary.  However, for the period from May 27, 2007,  to May 30, 2007, i.e., the date of this dictation, the following are  pertinent.  The patient underwent brain MRI on May 26, 2007.  This  showed no acute or reversible findings.  There was atrophy and gliosis  on the right frontal lobe that could be due to previous trauma or an old  stroke.  Brain MRA on the same date was negative for intracranial large  and medium-sized vessel stenosis.  Abdominal x-ray dated May 27, 2007,  showed Panda tube to the stomach.  Incidentally, degenerative changes  were noted in the spine.  The patient was transferred to [facility]  medical service on May 27, 2007.  Since then, his clinical progress has  been steady and satisfactory.  Mental status has considerably improved.  As a matter of fact, on May 30, 2007, the patient was able to carry out  a coherent conversation, was oriented to place, person and Economist.  Failure to thrive appears to have resolved.  The patient has been  maintaining good oral intake and as a matter of fact, we were able to  completely discontinue Panda tube feeding on May 28, 2007.  He did pass  his modified barium swallow examination on May 26, 2007, and is  currently on dysphagia I diet with nectar-thick liquids.   Diabetes  mellitus has been controlled with a combination of carbohydrate modified  diet and sliding-scale insulin coverage.  The patient is, of course,  deconditioned because of his recent acute illness and will benefit from  ongoing physical therapy, occupational therapy and rehabilitation,  particularly given the fact that he is status post right total hip  replacement on April 24, 2007.   DISPOSITION:  As of May 30, 2007, the patient was considered clinically  stable for discharge to skilled nursing facility for continued  rehabilitation as soon as a bed becomes available.   DISCHARGE MEDICATIONS:  1. Megace suspension 400 mg p.o. daily.  2. Multivitamin one p.o. daily.  3. Risperdal 2 mg p.o. b.i.d.  4. Folic acid 1 mg p.o. daily.  5. Thiamine 100 mg p.o. daily.  6. Protonix 40 mg p.o. daily.  This medication list of supersedes and updates discharge medication list  noted in discharge summary of May 26, 2007.  Of course, it may be  further modified as indicated at the  time of actual discharge by  discharging MD.  1. Sliding-scale insulin coverage with NovoLog as follows.  CBG 70-100      - no insulin, CBG 101-150 - 3 units, CBG 151-200 - 4 units, CBG 201-      250 - 7 units, CBG 251-300 - 9 units, CBG 301-350 - 12 units, CBG      351-400 - 15 units.   DIET:  Carbohydrate modified.   ACTIVITY:  Per PT/OT/rehab.   FOLLOW-UP INSTRUCTIONS:  The patient is to follow up with his primary MD  at [facility] routinely.  He is also to follow up with Dr. Doneen Poisson, orthopedic surgeon, on a date to be determined.      Isidor Holts, M.D.  Electronically Signed     CO/MEDQ  D:  05/30/2007  T:  05/30/2007  Job:  [id number]   cc:   Vanita Panda. Magnus Ivan, M.D.

## 2010-06-10 NOTE — Consult Note (Signed)
Bradley [name], Bradley [name]                ACCOUNT NO.:  [id number]   MEDICAL RECORD NO.:  [id number]          PATIENT TYPE:  INP   LOCATION:  1503                         FACILITY:  [facility]   PHYSICIAN:  Anselm Jungling, MD  DATE OF BIRTH:  05/21/1945   DATE OF CONSULTATION:  04/28/2007  DATE OF DISCHARGE:                                 CONSULTATION   IDENTIFYING DATA AND REASON FOR REFERRAL:  The patient is a 65 year old  unmarried Caucasian male, apparently homeless, who is currently being  treated for hip fracture here at [facility].  He also appears  to have a history of severe chronic alcoholism and associated diabetes  mellitus.  The patient has made some suicidal remarks.  A psychiatric  consultation is requested to assess mental status and make  recommendations.   HISTORY OF THE PRESENTING PROBLEMS:  Little historical information is  known about this patient.  He is not able to give much history, due to  his mental status.  To our knowledge, he is homeless, and severely  alcoholic.   Yesterday, Dr. Magnus Ivan, his attending surgeon, contacted me regarding  the patient's suicidal statements and desire to leave.  I indicated my  suspicion that the patient was wanting to leave out of alcohol craving,  and that he might actually be going into alcohol withdrawal, given that  he had been hospitalized for 3 to 4 days, and had suddenly stopped  drinking a concomitantly.  I suggested beginning Librium 25 mg q.i.d.  which has been initiated.   It is not clear whether the patient has a psychiatric history, although  it would appear fairly likely, possibly chronic schizophrenia.   Social services has been involved and his trying to contact the  patient's next of kin.   Disposition may be complicated by the patient's possible preference for  homelessness, he may be resistant to going to a skilled nursing  facility, even though it will be imperative.   MENTAL STATUS AND  OBSERVATIONS:  The patient is a adequately nourished,  normally-developed adult male with a long straggly hair and beard, very  unkempt, disheveled, lying naked on his bed.  A one-to-one sitter is  nearby.  When I come into the room and speak his name, he responds  somewhat groggily.  He is able to state that he at the hospital and is  aware that he is here for medical treatment of his hip.  He is not able  to answer how long he has been here.  He denies any specific complaints  right now.  I mentioned to him the fact that he has been given some  medication to help keep you calm, and I asked him how it is working,  and he indicates that it is working well.  He appears quite relaxed, and  in fact, proceeds to fall asleep while I attempt to interview him  further.   The patient made no delusional statements during our interaction.  He  did make one statement having to do with getting groceries, which  suggested the possibility of some significant  confusion, even though in  the most basic sense, he is oriented to place and situation.   IMPRESSION:  The patient is a 65 year old homeless male, with an unclear  psychiatric history, but a clear history of chronic alcoholism, who has  made some suicidal statements and wanted to leave, but has since  indicated that he stated that because of a strong desire to drink.  He  now seems to be comfortable on Librium 25 mg q.i.d.   We do not know how much he has been drinking, but we should be prepared  for the risk of significant alcohol withdrawal and even the possibility  of DTs, an eventuality that could surface as late as 7 days after his  last drink.   I would recommend continuing him on Librium 25 mg q.i.d. at this time.  I note that Haldol is ordered on a p.r.n. basis for agitation, which is  quite appropriate.   DIAGNOSTIC IMPRESSION:  Axis I:  Alcohol dependence, chronic, severe.  Axis II:  Deferred.  Axis III:  Diabetes mellitus, hip  fracture.  Axis IV:  Stressors, severe.  Axis V:  GAF 35.   RECOMMENDATIONS:  As above.  Please do not hesitate to contact me for  further followup and consultation regarding this gentleman.  If any  frank psychiatric symptoms arise, will need to consider the possibility  of inpatient psychiatric hospitalization for him, following his medical  stabilization.  Anselm Jungling, MD:  Cell phone [phone].      Anselm Jungling, MD  Electronically Signed     SPB/MEDQ  D:  04/28/2007  T:  04/28/2007  Job:  [id number]

## 2010-06-10 NOTE — Op Note (Signed)
NAMEVASILY, Bradley [name]                ACCOUNT NO.:  [id number]   MEDICAL RECORD NO.:  [id number]          PATIENT TYPE:  INP   LOCATION:  0104                         FACILITY:  [facility]   PHYSICIAN:  Vanita Panda. Magnus Ivan, M.D.DATE OF BIRTH:  [birth year]   DATE OF PROCEDURE:  04/24/2007  DATE OF DISCHARGE:                               OPERATIVE REPORT   PREOPERATIVE DIAGNOSIS:  Left hip femoral neck fracture.   POSTOPERATIVE DIAGNOSIS:  Left hip femoral neck fracture.   PROCEDURE:  Left hip hemiarthroplasty.   IMPLANTS:  DePuy Summit basic Press-Fit stem size 4, size 54 unipolar  head, -3 tapered spacer.   SURGEON:  Doneen Poisson, MD   ANESTHESIA:  General.   ANTIBIOTICS:  1 gram vancomycin.   BLOOD LOSS:  200 mL.   COMPLICATIONS:  None.   INDICATIONS:  Briefly Mr. Mustin is a 65 year old diabetic who has also  history of alcohol abuse.  He has been noncompliant with medicines.  Apparently Friday night he had a fall and hurt his left hip and had  inability ambulate.  He did not come to the ER until today on Sunday  which 2 days later.  He was found to have a displaced femoral neck  fracture it was recommended that he undergo a thorough medical  evaluation as well as the hip hemiarthroplasty.  The risks and benefits  of surgery were explained to him in length and he agreed to proceed with  surgery.   PROCEDURE:  Informed consent was obtained, the appropriate left hip was  marked.  He was brought to the operating room and placed supine on the  operating table.  General anesthesia was obtained.  I then turned him in  lateral decubitus position with left operative hip up.  Axillary rolls  placed as well as a hip positioners.  Padding was placed under the down  nonoperative right leg.  His hip was then cleaned thoroughly with scrub  and then we dried this and prepped him with DuraPrep and sterile drapes  including sterile stockinette.  Time-out was called and he was  identified as the correct patient, correct left hip.  I then made an  incision centered directly over the greater trochanter and carried this  proximally and distally.  I dissected down to the iliotibial band and  divided this longitudinally.  I then took an anterior lateral approach  to the hip.  I took two thirds of the gluteus medius and minimus tendons  down to the sleeve off the vastus ridge and carried them anteriorly.  I  then found hip capsule and made a T-type incision in the hip capsule and  hematoma was encountered.  The fracture neck was easily identified.  Using an oscillating saw I then made a neck cut about a fingerbreadth or  higher more proximal to the lesser trochanter.  The femoral head was  then removed and the acetabulum was cleared of debris and copiously  irrigated.  A size 54 trial head was then placed this was found to be  the correct size.  I then next flexed the hip  and externally rotated it  off the table into the leg bag. Initiating reamer was used to enter the  femoral canal and the canal finder followed by a lateralizing reamer.  I  then broached with #1 broach up to the size 4 broach and he was a quite  thin individual and the size 4 broach was very tight.  I then placed a -  3 spacer and a and a 54 trial head and reduced this into the acetabulum.  I placed the hip through the range of motion and it was found to be  stable.  His leg lengths were near equal.  I then removed all trial  components and thoroughly irrigated the hip again, I then placed the  real Summit basic Press-Fit stem size 4 followed by the -3 spacer and 54  head.  These were then reduced, the acetabulum as well and I put the hip  again through range of motion and it was found to be stable and his leg  lengths were near equal.  I then closed the hip capsule with #1 Ethilon  suture followed by reapproximation of the gluteus medius and minimus to  the vastus ridge using #1 Ethibond suture.  I  closed the IT band with  interrupted #1 Vicryl suture followed by interrupted 2-0 in the  subcutaneous tissue and staples on the skin.  A well-padded sterile  dressing was applied.  The patient was rolled back into a supine  position.  His leg lengths were found to be near equal.  He was  awakened, extubated and taken to recovery room in stable condition.  All  final counts were correct and there were no complications noted.      Vanita Panda. Magnus Ivan, M.D.  Electronically Signed     CYB/MEDQ  D:  04/24/2007  T:  04/25/2007  Job:  [id number]

## 2010-06-10 NOTE — Discharge Summary (Signed)
NAMEZAHI, Bradley [name]                ACCOUNT NO.:  [id number]   MEDICAL RECORD NO.:  [id number]          PATIENT TYPE:  INP   LOCATION:  1503                         FACILITY:  [facility]   PHYSICIAN:  Vanita Panda. Magnus Ivan, M.D.DATE OF BIRTH:  21-Jan-1946   DATE OF ADMISSION:  04/24/2007  DATE OF DISCHARGE:  05/02/2007                               DISCHARGE SUMMARY   ADMITTING DIAGNOSIS:  Left hip femoral neck fracture.   SECONDARY DIAGNOSES:  1. Chronic alcohol abuse.  2. Type 2 diabetes.   DISCHARGE DIAGNOSIS:  Left hip fracture, status post left hip  hemiarthroplasty.   SECONDARY DIAGNOSES:  1. Alcohol abuse.  2. Type 2 diabetes.   PROCEDURES:  A left hip hemiarthroplasty on April 24, 2007.   HOSPITAL COURSE:  Briefly, Bradley [name] is a 65 year old type 2 diabetic  who also has a history of alcohol abuse.  Two days prior to admission,  he had a mechanical fall, this is about the last time he said he had a  drank.  He had the inability ambulating and was brought into the ER on  April 24, 2007, he was found to have a displaced left femoral neck  fracture.  He was taken to the operating room on the day of admission,  where he underwent a left hip hemiarthroplasty without complications.  For detailed description of the operation, please refer the dictated  operative note in the patient's medical record.  Postoperatively, he was  admitted to a regular orthopedic bed and did have him on alcohol  precautions as well as Ativan protocol for withdrawal.  He did not  display much of withdrawal symptoms and he was eventually transferred  from the orthopedic floor to the medical psych unit for further  evaluation and treatment.  He did have 1 episode of suicidal ideation  and he was seen by Dr. Doristine Counter from Psychiatry, who said this was more  of his alcohol cravings.  He was started on Librium for this and began  working cooperatively with physical therapy.  By the day of discharge,  his  incision had some slight redness to it and I think this was more of  irritation from the staples, but he did have some incontinence earlier  and that it could be a superficial cellulitis from urine.  Otherwise,  though his vital signs were stable, he was afebrile, and he was starting  to work well with physical therapy and still in need of an assistance.  It is felt that discharge to a skilled nursing facility could be helpful  as he continued to recover.   DISPOSITION:  To skilled nursing facility.   DISCHARGE MEDICATIONS:  1. Librium 25 mg p.o. q.i.d.  2. Doxycycline 100 mg p.o. b.i.d. for 14 days.  3. Ensure shakes with meals.  4. Sliding scale insulin.  5. Multivitamin 1 p.o. daily.  6. Thiamine 100 mg p.o. daily.  7. Coumadin every 1800 hours daily with titration or adjustment for      target INR of 2-2.5.  8. Ativan 1 mg p.o. every 6 hours p.r.n.  9. Vicodin 5/500  1-2 p.o. every 4-6 hours p.r.n. pain.  10.Robaxin 500 mg p.o. every 6 hours spasms.  11.Senokot 1 p.o. b.i.d.   DISCHARGE INSTRUCTIONS:  Ice and elevate at skilled nursing facility,  daily dry dressings to be placed on his hip.  He can continue to work  with weightbearing as tolerated and up as tolerated with physical  therapy.  A followup appointment should be established with [facility]  who manages his medications for diabetes mellitus complaint.  This  should be done in 1-2 weeks after discharge.  Also, followup appointment  should be with Dr. Doneen Poisson at [facility] in 2  weeks after discharge.      Vanita Panda. Magnus Ivan, M.D.  Electronically Signed     CYB/MEDQ  D:  05/02/2007  T:  05/03/2007  Job:  [id number]

## 2010-06-13 NOTE — Discharge Summary (Signed)
NAMERAYSHAD, RIVIELLO                ACCOUNT NO.:  [id number]   MEDICAL RECORD NO.:  [id number]          PATIENT TYPE:  INP   LOCATION:  1503                         FACILITY:  [facility]   PHYSICIAN:  Vanita Panda. Magnus Ivan, M.D.DATE OF BIRTH:  03/23/1945   DATE OF ADMISSION:  04/24/2007  DATE OF DISCHARGE:  05/05/2007                               DISCHARGE SUMMARY   ADDENDUM:  This is an addendum to a previous discharge summary on Mr.  Hall Birchard. He continued convalescence in the [facility]  after being admitted on April 24, 2007, for a hip fracture. He underwent  a fixation for this hip fracture, but he did have a history of chronic  alcohol abuse. He had remained past May 02, 2007, in the hospital due  to placement issues. He was on watch for DTs and was on Ativan protocol  for alcohol abuse. It was attempted to get him placement and so a  priority discharge dictation was performed on May 02, 2007, per  recommendations by the clinical social worker. However, he did not get  placement at [facility] until May 05, 2007. Please refer to the  previous discharge summary for further instructions as to discharge  medications and discharge instructions.   Again, this is addendum covers between his original scheduled discharge  of May 02, 2007, to May 05, 2007, due to placement issues.      Vanita Panda. Magnus Ivan, M.D.  Electronically Signed     CYB/MEDQ  D:  06/04/2007  T:  06/04/2007  Job:  [id number]

## 2010-06-13 NOTE — Op Note (Signed)
Bradley [name], Bradley [name]                ACCOUNT NO.:  [id number]   MEDICAL RECORD NO.:  [id number]          PATIENT TYPE:  INP   LOCATION:  3038                         FACILITY:  [facility]   PHYSICIAN:  Nadara Mustard, MD     DATE OF BIRTH:  Jan 30, 1945   DATE OF PROCEDURE:  08/10/2005  DATE OF DISCHARGE:                                 OPERATIVE REPORT   PREOPERATIVE DIAGNOSIS:  Osteomyelitis, right forefoot.   POSTOPERATIVE DIAGNOSIS:  Osteomyelitis, right forefoot.   PROCEDURE:  Right transmetatarsal amputation.   SURGEON:  Nadara Mustard, M.D.   ANESTHESIA:  Ankle block.   ESTIMATED BLOOD LOSS:  Minimal.   ANTIBIOTICS:  Avelox preoperatively.   TOURNIQUET TIME:  None.   DRAINS:  None.   COMPLICATIONS:  None.  Forefoot sent to pathology.   DISPOSITION:  To PACU in stable condition.   INDICATIONS FOR PROCEDURE:  The patient is a 65 year old gentleman with  diabetic insensate neuropathy who is status post a right fourth ray  amputation by Dr. Chaney Malling November 2006.  The patient presented with  abscess draining from his previous surgical incision with radiographs  showing osteomyelitis of the third metatarsal and third toe.  The patient's  medical care was initially attempted through Dr. Chaney Malling, and Dr.  Chaney Malling stated that he had signed off on the patient, and he was no longer  going to be providing care for the patient.  The patient was started on IV  vancomycin and Zosyn.  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  Risks and  benefits were discussed including infection, neurovascular injury,  persistent pain, nonhealing of the wound, need for additional surgery.  The  patient states he understands and wishes to proceed at this time.   DESCRIPTION OF  PROCEDURE:  The patient was brought to OR Room 1 after  undergoing an ankle block.  After adequate level of anesthesia obtained, the  patient's right lower extremity was prepped using DuraPrep and draped into a  sterile field.  A fishmouth incision was made through the mid foot proximal  to the draining abscess.  The skin was deeply incised down to the  metatarsals, and the metatarsals were trimmed back at a gentle cascade  beveled plantarly with a gentle cascade to the fifth metatarsal.  The Bovie  was used for hemostasis, and there was good bleeding petechial tissue.  There was no evidence of abscess within the surgical incision.  After  irrigation with normal saline, the wound was closed using a  far/near/near/far suture with 2-0 nylon.  The skin closed without any  tension on the skin.  The wound was covered with Adaptic orthopedic sponges,  sterile Webril and a Coban dressing.  The patient was taken to PACU in  stable condition.  He will be discharged to home once he is stable with  ambulation, nonweightbearing on the right.  Nadara Mustard, MD  Electronically Signed     MVD/MEDQ  D:  08/10/2005  T:  08/11/2005  Job:  [id number]

## 2010-06-13 NOTE — Op Note (Signed)
NAMEOSSIE, BELTRAN                ACCOUNT NO.:  [id number]   MEDICAL RECORD NO.:  [id number]          PATIENT TYPE:  INP   LOCATION:  5011                         FACILITY:  [facility]   PHYSICIAN:  Rodney A. Mortenson, M.D.DATE OF BIRTH:  1945-02-13   DATE OF PROCEDURE:  12/20/2004  DATE OF DISCHARGE:                                 OPERATIVE REPORT   PREOPERATIVE DIAGNOSIS:  Cellulitis, abscess formation, right foot,  secondary to puncture wound on plantar surface right foot.   POSTOPERATIVE DIAGNOSIS:  Cellulitis, abscess formation, right foot,  secondary to puncture wound on plantar surface right foot.   PROCEDURE:  Incision and drainage, opening of intermetatarsal space between  third and fourth metatarsal heads and drainage of large abscess.   SURGEON:  Lenard Galloway. Chaney Malling, M.D.   ANESTHESIA:  General.   PROCEDURE NOTE:  The patient was placed on the operating table in supine  position. Pneumatic tourniquet about the right upper thigh. After  satisfactory general anesthesia, right lower extremity was prepped with  Betadine and draped in the usual manner. The leg was then wrapped out with  Esmarch and the tourniquet was elevated. There was a large puncture wound on  the plantar surface of the foot between the third and fourth metatarsal  shafts. An elliptical incision of the puncture wound was done. Incision was  then carried anterior to the web space and brought over to the dorsum of the  foot. There was a great deal of cellulitis and erythema on the dorsal aspect  of the foot. The intermetatarsal space was then opened with curved clamps.  There was a fair amount abscess, and collection of purulent yellowish  material was evacuated. Aerobic and anaerobic cultures were taken. The  abscess formation extended medial and lateral to the incision on the dorsum  of the foot, and also on the plantar surface of the foot, it extended  proximally to about the level of calcaneus. Incision  made through this area  on the plantar surface of the foot. The wound was opened quite widely. No  significant narcotic damage had occurred to the muscles. Blunt dissection  was carried throughout except through the skin. Pulse lavage was used. A  great deal of fluid was used to flush out the wound. Wound was left wide  open and packed open with saline sponges and __________ bulky pressure  dressing. The patient returned to recovery room in excellent condition.  Technically, this procedure went extremely well.   DRAINS:  None.   COMPLICATIONS:  None.           ______________________________  Lenard Galloway. Chaney Malling, M.D.    RAM/MEDQ  D:  12/20/2004  T:  12/20/2004  Job:  [id number]

## 2010-06-13 NOTE — Discharge Summary (Signed)
Bradley [name], ROSENDAHL                ACCOUNT NO.:  [id number]   MEDICAL RECORD NO.:  [id number]          PATIENT TYPE:  INP   LOCATION:  5011                         FACILITY:  [facility]   PHYSICIAN:  Rodney A. Mortenson, M.D.DATE OF BIRTH:  05/03/1945   DATE OF ADMISSION:  12/19/2004  DATE OF DISCHARGE:  12/26/2004                                 DISCHARGE SUMMARY   ADMISSION DIAGNOSIS:  Abscess of the right foot.   DISCHARGE DIAGNOSES:  1.  Abscess of the right foot.  2.  Gangrenous right foot.  3.  Chronic obstructive airway disease.  4.  Alcohol abuse.  5.  __________ organism infection.  6.  Hypertension.  7.  Tobacco use.  8.  Diabetes, type 2.  9.  Neuropathy secondary to diabetes.   PROCEDURE:  1.  On December 20, 2004, incision and debridement of the right forefoot.  2.  On December 24, 2004, incision and debridement  and amputation of the      4th toe of the right foot.   HISTORY:  A 65 year old male with pain when wearing his shoes for two days.  He found a tack in his shoe and some blood on the sock. Apparently this  festered over several days with increased redness and soreness over the past  week. He is a diabetic with a peripheral neuropathy. He presented to the  emergency room on the 24th with right foot pain. He had failed self  treatment and at that time was admitted for I&D of the right foot.   HOSPITAL COURSE:  This is a 65 year old male admitted on January 18, 2005,  after appropriate laboratory studies were obtained. He was taken to the  operating room where he underwent I&D of the right forefoot with cultures.  He tolerated the procedure well. At that time he was placed on Ancef 1 gm  q.8h. Preoperatively he also had a tetanus performed. Sliding scale insulin  orders were used. There was a skin care consult for dressing changes and  pulsatile lavage. Medicine was consulted and cared for his diabetic  requirements. This included glipizide 5 mg p.o. q.24h.   He was seen by ID  and on the 28th of November was started on vancomycin IV per pharmacy  protocol. He increased to Ancef 2 gm IV q.8h. it was felt that the 4th toe  was not viable and he was taken back to the operating room on the 29th where  he underwent an I&D and 4th toe amputation. This was left open to granulate.  He was continued on his antibiotics as per IV. It was felt that with his  organisms that Keflex 500 mg b.i.d. for six weeks was indicated. He was then  discharged on the 1st to return back to the office in one week for re-  evaluation. EKG revealed sinus tachycardia. Radiographic studies of the  right foot revealed __________ arthropathy of the first metatarsophalangeal  joint and the second interphalangeal joint proximally. No definite gas was  evident in the soft tissue.   LABORATORY STUDIES:  Hemoglobin of 13.6, hematocrit 40.1%, white  count  17,100, platelet count 346,000. Discharge hemoglobin was 0.8, hematocrit  37.7%, white count 10,800, platelet count 512,000. Preoperative chemistries  revealed sodium of 128, potassium 3.8, chloride 90, CO2 95, glucose 219, BUN  9, creatinine 0.9, calcium 8.7, total protein 6.5, albumin 2.7, AST 16, ALT  20, alkaline phosphatase 100, total bilirubin 1.2. Discharge sodium 131,  potassium 4.4, chloride 93, CO2 30, glucose 209, BUN 11, creatinine 0.7,  calcium 9.1. Glycosylated hemoglobin 10.9. Lipid profile revealed  cholesterol of 111, triglycerides 84, HDL 22, ratio of 5.0, VLDL 17, LDL 72.  B12 was 2703. Creatinine 55.9 of the urine. Microalbumin 0.89, and  microalbumin and creatinine ratio of 13.9, within normal limits.  Wound  culture of the right foot revealed moderate Staphylococcus aureus, with a  few group B strep, which were sensitive to clindamycin, erythromycin,  gentamycin, Levaquin, and __________ , amoxicillin, penicillin, rifampin,  tetracycline, Septra, and __________ . No anaerobes were noted. RPR was  negative.    DISCHARGE INSTRUCTIONS:  Diet is restricted to a diabetic diet as taught in  the hospital. No driving until instructed. He is to use his crutches and  walker. Wound care per home health. Tylenol for pain. Keflex 500 mg one p.o.  b.i.d. until finished. Glipizide 10 mg one tablet daily and NPH insulin 5  units two times per day. Baby aspirin 81 mg daily. He will follow back up  with Dr. Chaney Malling on Monday or Tuesday post discharge. Outpatient clinic on  January 07, 2005, at 10 a.m. Home health will continue his wound care.  Discharged in improved condition.      Oris Drone Petrarca, P.A.-C.    ______________________________  Lenard Galloway Chaney Malling, M.D.    BDP/MEDQ  D:  02/25/2005  T:  02/25/2005  Job:  [id number]

## 2010-06-13 NOTE — Op Note (Signed)
Bradley [name], Bradley [name]                ACCOUNT NO.:  [id number]   MEDICAL RECORD NO.:  [id number]          PATIENT TYPE:  INP   LOCATION:  5011                         FACILITY:  [facility]   PHYSICIAN:  Claude Manges. Whitfield, M.D.DATE OF BIRTH:  May 25, 1945   DATE OF PROCEDURE:  12/24/2004  DATE OF DISCHARGE:                                 OPERATIVE REPORT   PREOPERATIVE DIAGNOSIS:  Diabetic peripheral vascular disease and  neuropathy, right foot, with abscess, status post initial irrigation and  debridement, with necrotic fourth toe.   POSTOPERATIVE DIAGNOSIS:  Diabetic peripheral vascular disease and  neuropathy, right foot, with abscess, status post initial irrigation and  debridement, with necrotic fourth toe.   PROCEDURE:  Irrigation and debridement of wounds, right foot, with ray  amputation of fourth toe.   SURGEON:  Claude Manges. Cleophas Dunker, M.D.   ANESTHESIA:  General orotracheal.   COMPLICATIONS:  None.   PROCEDURE:  With the patient comfortable on the operating table and under  general orotracheal anesthesia, the right foot was prepped with Betadine,  prepped from the tips of the toes to the knee.  Sterile draping was  performed.  A tourniquet was not utilized.   The patient had had a previous I&D of the right foot with open wounds  extending from the base of the fourth metatarsal coursing between the third  and fourth toes and then extending onto the plantar aspect of her foot to  the same extent as the dorsal wounds.  The wounds were inspected.  There was  an area of about a centimeter on either side of the dorsal wounds where the  skin was avascular.  This area was sharply debrided without any bleeding of  the skin.  I debrided back to where there was bleeding.  The depth of the  wound had some areas of necrotic tissue, which were debrided including  extensor tendon to the fourth toe.  The fourth toe was dusky with no  capillary refill and, accordingly, it was amputated at the  metatarsophalangeal joint.  There was no bleeding around the visible  metatarsal head, and accordingly I resected the metatarsal to the proximal  third, i.e., fourth, metatarsal.  There were further areas of necrosis on  the plantar aspect of the wounds, which were then sharply debrided.  I felt  that the third toe was viable.  The fifth toe was of questionable viability.  The wounds were then copiously irrigated with saline solution.  I packed the  wound with a Betadine-soaked Kerlix gauze and 4 x 4 gauze and a dry Kerlix.  The patient was then returned to the postanesthesia recovery room in  satisfactory condition.      Claude Manges. Cleophas Dunker, M.D.  Electronically Signed     PWW/MEDQ  D:  12/24/2004  T:  12/25/2004  Job:  [id number]

## 2010-06-13 NOTE — Discharge Summary (Signed)
NAMEMITCHEAL, Bradley [name]                ACCOUNT NO.:  [id number]   MEDICAL RECORD NO.:  [id number]          PATIENT TYPE:  INP   LOCATION:  3038                         FACILITY:  [facility]   PHYSICIAN:  Hettie Holstein, D.O.    DATE OF BIRTH:  [birth year]   DATE OF ADMISSION:  08/06/2005  DATE OF DISCHARGE:  08/13/2005                                 DISCHARGE SUMMARY   PRIMARY CARE PHYSICIAN:  [facility]   PRIMARY ORTHOPEDIC SURGEON:  Nadara Mustard, MD   REASON FOR ADMISSION:  Osteomyelitis of his left foot.  Blood cultures  positive for methicillin-sensitive Staph aureus.   DISCHARGE MEDICATIONS:  1. Lantus 28 units subcu each evening.  He is to try to check his fasting      a.m. blood glucose and increase his Lantus by 1 unit each night until      his fasting glucose is less than 100.  2. He is to provide himself with NovoLog insulin 3 units with or      immediately after each full meal.  He was instructed to test his blood      sugar before each meal and follow a sliding scale, which he claims he      was educated at doing and was able to do this.  3. Glucophage 850 mg daily.   DISPOSITION:  He was instructed to follow up with the [facility] in 1 or 2  weeks following discharge as well as Dr. Lajoyce Corners for follow up in 3 weeks.  He  was provided a number.  He was instructed to follow up with Dr. Lajoyce Corners for his  dressing change.   HISTORY OF PRESENT ILLNESS:  For full details, please refer to the H&P as  dictated by Dr. Lonia Blood.  However, briefly, Bradley [name] is a 65 year old  gentleman with diabetes with a diabetic foot infection November 2006, with  amputation of his fourth toe on the right foot.  After his surgery,  apparently, he had poor compliance.  He neglected to follow up or take the  medications for diabetes.  He noticed that his foot was getting  progressively worse and painful and decided to be evaluated in the Emergency  Department.  He is evaluated and diagnosed with  cellulitis/osteomyelitis and  placed on oral antibiotics and set up to follow at [facility].  His foot  got more edematous and painful, and he presented back to the Emergency  Department.   HOSPITAL COURSE:  He is admitted due to being dismissed from Dr. Andreas Blower  practice.  He was consulted on by Dr. Lajoyce Corners of Orthopedic Surgery.  He  underwent bilateral arterial studies with ABIs discovered to be normal.  He  was discovered to have osteomyelitis of the third metatarsal of his right  foot.  He was scheduled for amputation and continued on Zosyn and vancomycin  during his course, and subsequently he was discovered to MSSA growing from  his cultures, and he was continued on Levaquin.  On August 10, 2005, he  underwent right transmetatarsal amputation and subsequently returned to and  did  well postoperatively.  He was evaluated by Social Services for  disposition and  this was coordinated, as well as evaluated by Physical Therapy, Occupational  Therapy and wound care follow up was coordinated by Dr. Lajoyce Corners of Orthopedic  Surgery.  Showed to be medically stable for discharge home on the  medications as stated above.      Hettie Holstein, D.O.  Electronically Signed     ESS/MEDQ  D:  09/23/2005  T:  09/23/2005  Job:  [id number]   cc:   Nadara Mustard, MD

## 2010-06-13 NOTE — H&P (Signed)
[name], [name]                ACCOUNT NO.:  [id number]   MEDICAL RECORD NO.:  [id number]          PATIENT TYPE:  INP   LOCATION:  1825                         FACILITY:  [facility]   PHYSICIAN:  Lonia Blood, M.D.       DATE OF BIRTH:  March 17, 1945   DATE OF ADMISSION:  08/06/2005  DATE OF DISCHARGE:                                HISTORY & PHYSICAL   PRIMARY CARE PHYSICIAN:  [facility].   HISTORY OF PRESENT ILLNESS:  Bradley [name] is a 65 year old gentleman with  diabetes mellitus who had a diabetic foot infection in November of 2006 with  amputation of his fourth toe on the right foot.  After the surgery, the  patient did not take care of himself.  He did not go for follow-up  appointments and did not take medications for his diabetes.  He noticed that  his right foot has been getting progressively more painful and more swollen  for the past months to a point where he decided last month to get it checked  in the emergency room.  He was evaluated in the emergency room and diagnosed  with cellulitis, osteomyelitis, was placed on oral antibiotics and set up to  follow up at [facility].  The patient's foot got more edematous and more  painful so he presented back to the emergency room today.  The patient  denies any fever, chills or systemic symptoms.   PAST MEDICAL HISTORY:  1.  Diabetes mellitus.  2.  Mild COPD.  3.  Tobacco abuse.  4.  Amputation of the fourth toe of the right foot on November of 2006   SOCIAL HISTORY:  The patient smokes a pack of cigarettes a day.  He drinks a  beer a day because he does have money to buy more alcohol.  He does not  report any illicit drug abuse.  The patient is currently unemployed.  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  The patient is divorced  and he has no children.  The patient has four sisters.  They are alive, but  he does not keep in contact with them.   FAMILY HISTORY:  The patient's mother died of cancer of unknown  type.  The  patient's father died in a motor vehicle accident.   REVIEW OF SYSTEMS:  As per HPI.  The patient also denies chest pain.  Denies  nausea, vomiting or abdominal pain.  Other systems are negative.   PHYSICAL EXAMINATION UPON ADMISSION:  VITAL SIGNS:  Temperature 97.1, blood  pressure 118/72, pulse 83, respirations 20, saturation 90% on room air.  GENERAL APPEARANCE:  Patient appears a disheveled, well nourished gentleman  in no acute distress, alert, oriented to place, person and time.  HEENT:  Head is normocephalic, atraumatic.  Eyes:  Pupils equal, round,  reactive to light and accommodation.  Extraocular movements intact.  Throat  is clear.  NECK:  Supple without JVD, no carotid bruits.  The patient has a prominent  beard.  CHEST:  Clear to auscultation bilaterally without wheezes, rhonchi or  crackles.  HEART:  Regular rate and rhythm without murmurs, rubs or gallops.  ABDOMEN:  The patient's abdomen is soft, nontender and nondistended.  Bowel  sounds are present.  LOWER EXTREMITIES:  No edema in the pretibial area.  The versus pedis and  tibial posterior pulses are present bilaterally and are good.  The patient's  right foot has significant edema and tenderness to palpation.  There is a  deep ulceration that extends in the middle of his foot that tracks down to  the bone and there is also an ulceration of the base of the first fell on  the right foot.  NEUROLOGIC:  The patient's neurological exam is nonfocal.  PSYCHIATRIC:  He does appear to have intact memory. He has very poor inside.   ADMISSION LABORATORY DATA:  All the blood work is pending.   A x-ray of the right foot confirms osteomyelitis of the third metatarsal  head.   ASSESSMENT/PLAN:  1.  Osteomyelitis of the third metatarsal of the right foot.  This is most      likely a chronic osteomyelitis.  At this point in time, given the      appearance of this patient's foot, I do have very poor hopes for a       conservative approach using local care and intravenous antibiotics.  I      do think this patient ultimately will require amputation.  I have      obtained arterial Dopplers to rule out peripheral vascular disease,      started this patient on empiric antibiotics, vancomycin, and Zosyn and      consulted Dr. Chaney [name] who has operated previously on this patient.  2.  Diabetes mellitus.  I do suspect very poor control since this patient      has not been taking any medications or insulin.  We will obtain a      hemoglobin A1c, start the patient on Lantus and sliding scale insulin      while he was in the hospital and obtain diabetic education.  3.  Homelessness.  We have consulted a Child psychotherapist to help the patient      apply for Medicaid and help [place] with disposition at the time of      discharge.      Lonia Blood, M.D.  Electronically Signed     SL/MEDQ  D:  08/06/2005  T:  08/06/2005  Job:  [id number]   cc:   Bradley [name], M.D.  Fax: [phone]   [facility]

## 2010-10-20 LAB — BASIC METABOLIC PANEL
CO2: 27
CO2: 29
Calcium: 7.9 — ABNORMAL LOW
Chloride: 101
Creatinine, Ser: 0.68
GFR calc Af Amer: >60
GFR calc Af Amer: >60
GFR calc non Af Amer: >60
Glucose, Bld: 264 — ABNORMAL HIGH
Potassium: 3.6
Potassium: 4
Potassium: 4.2
Sodium: 134 — ABNORMAL LOW
Sodium: 134 — ABNORMAL LOW
Sodium: 136

## 2010-10-20 LAB — CBC
HCT: 45.8
Hemoglobin: 12 — ABNORMAL LOW
MCHC: 35.3
MCV: 86.7
MCV: 87.5
MCV: 87.7
Platelets: 168
Platelets: 201
RBC: 3.91 — ABNORMAL LOW
RBC: 4.09 — ABNORMAL LOW
RBC: 5.28
WBC: 15.6 — ABNORMAL HIGH
WBC: 8.7
WBC: 9.1

## 2010-10-20 LAB — TYPE AND SCREEN: Antibody Screen: NEGATIVE

## 2010-10-20 LAB — PROTIME-INR
INR: 0.9
INR: 1
Prothrombin Time: 13.2
Prothrombin Time: 16.6 — ABNORMAL HIGH

## 2010-10-20 LAB — DIFFERENTIAL
Basophils Relative: 0
Eosinophils Absolute: 0
Eosinophils Relative: 0
Lymphs Abs: 1.4
Monocytes Relative: 8
Neutrophils Relative %: 83 — ABNORMAL HIGH

## 2010-10-20 LAB — URINALYSIS, ROUTINE W REFLEX MICROSCOPIC
Glucose, UA: >1000 — AB
Hgb urine dipstick: NEGATIVE
Ketones, ur: 15 — AB
Protein, ur: 100 — AB

## 2010-10-21 LAB — CSF CELL COUNT WITH DIFFERENTIAL
Lymphs, CSF: 0 — ABNORMAL LOW
Monocyte-Macrophage-Spinal Fluid: 0 — ABNORMAL LOW
Monocyte-Macrophage-Spinal Fluid: 0 — ABNORMAL LOW
RBC Count, CSF: 0
RBC Count, CSF: 48 — ABNORMAL HIGH
Segmented Neutrophils-CSF: 0
Tube #: 1
Tube #: 3
WBC, CSF: 1

## 2010-10-21 LAB — BASIC METABOLIC PANEL
BUN: 12
BUN: 11
BUN: 4 — ABNORMAL LOW
BUN: 4 — ABNORMAL LOW
BUN: 5 — ABNORMAL LOW
BUN: 5 — ABNORMAL LOW
BUN: 7
BUN: 9
CO2: 23
CO2: 25
CO2: 25
CO2: 25
CO2: 29
CO2: 30
Calcium: 8.2 — ABNORMAL LOW
Calcium: 8.2 — ABNORMAL LOW
Calcium: 8.5
Calcium: 8.6
Chloride: 96
Chloride: 101
Chloride: 104
Chloride: 108
Chloride: 98
Creatinine, Ser: 0.52
Creatinine, Ser: 0.55
Creatinine, Ser: 0.57
GFR calc Af Amer: >60
GFR calc Af Amer: >60
GFR calc Af Amer: >60
GFR calc non Af Amer: >60
GFR calc non Af Amer: >60
GFR calc non Af Amer: >60
GFR calc non Af Amer: >60
GFR calc non Af Amer: >60
GFR calc non Af Amer: >60
GFR calc non Af Amer: >60
GFR calc non Af Amer: >60
GFR calc non Af Amer: >60
GFR calc non Af Amer: >60
GFR calc non Af Amer: >60
GFR calc non Af Amer: >60
GFR calc non Af Amer: >60
Glucose, Bld: 135 — ABNORMAL HIGH
Glucose, Bld: 142 — ABNORMAL HIGH
Glucose, Bld: 105 — ABNORMAL HIGH
Glucose, Bld: 112 — ABNORMAL HIGH
Glucose, Bld: 130 — ABNORMAL HIGH
Glucose, Bld: 137 — ABNORMAL HIGH
Glucose, Bld: 203 — ABNORMAL HIGH
Glucose, Bld: 211 — ABNORMAL HIGH
Glucose, Bld: 229 — ABNORMAL HIGH
Glucose, Bld: 90
Potassium: 3.9
Potassium: 3 — ABNORMAL LOW
Potassium: 3 — ABNORMAL LOW
Potassium: 3.3 — ABNORMAL LOW
Potassium: 3.4 — ABNORMAL LOW
Potassium: 3.4 — ABNORMAL LOW
Potassium: 3.5
Potassium: 3.7
Sodium: 133 — ABNORMAL LOW
Sodium: 132 — ABNORMAL LOW
Sodium: 134 — ABNORMAL LOW
Sodium: 136
Sodium: 137
Sodium: 137
Sodium: 138
Sodium: 146 — ABNORMAL HIGH

## 2010-10-21 LAB — CBC
HCT: 34.2 — ABNORMAL LOW
HCT: 28.8 — ABNORMAL LOW
HCT: 30.4 — ABNORMAL LOW
HCT: 31.2 — ABNORMAL LOW
HCT: 32.4 — ABNORMAL LOW
HCT: 33.1 — ABNORMAL LOW
HCT: 34.7 — ABNORMAL LOW
HCT: 36 — ABNORMAL LOW
HCT: 37.9 — ABNORMAL LOW
HCT: 43.5
Hemoglobin: 12 — ABNORMAL LOW
Hemoglobin: 10 — ABNORMAL LOW
Hemoglobin: 10.3 — ABNORMAL LOW
Hemoglobin: 10.8 — ABNORMAL LOW
Hemoglobin: 11.1 — ABNORMAL LOW
Hemoglobin: 11.8 — ABNORMAL LOW
Hemoglobin: 13
Hemoglobin: 14.8
MCHC: 35.5
MCHC: 33
MCHC: 33.8
MCHC: 34.1
MCHC: 34.4
MCHC: 35.5
MCV: 86.1
MCV: 86.9
MCV: 86
MCV: 86.5
MCV: 86.7
MCV: 86.9
MCV: 87.1
MCV: 87.8
MCV: 88.4
Platelets: 346
Platelets: 180
Platelets: 208
Platelets: 218
Platelets: 248
Platelets: 250
Platelets: 262
Platelets: 288
RBC: 3.48 — ABNORMAL LOW
RBC: 3.55 — ABNORMAL LOW
RBC: 3.81 — ABNORMAL LOW
RBC: 3.99 — ABNORMAL LOW
RBC: 4.11 — ABNORMAL LOW
RDW: 12.7
RDW: 12.9
RDW: 12.8
RDW: 12.8
RDW: 13.1
RDW: 13.1
RDW: 13.1
RDW: 13.2
RDW: 13.3
RDW: 13.5
WBC: 8.8
WBC: 11.4 — ABNORMAL HIGH
WBC: 12 — ABNORMAL HIGH
WBC: 13.5 — ABNORMAL HIGH
WBC: 8.2
WBC: 9
WBC: 9.2

## 2010-10-21 LAB — POCT I-STAT 3, ART BLOOD GAS (G3+)
Bicarbonate: 19.6 — ABNORMAL LOW
O2 Saturation: 62
O2 Saturation: 98
Patient temperature: 98.6
TCO2: 20
TCO2: 24
pCO2 arterial: 34.8 — ABNORMAL LOW
pCO2 arterial: 37.8
pH, Arterial: 7.379
pH, Arterial: 7.483 — ABNORMAL HIGH
pO2, Arterial: 97

## 2010-10-21 LAB — CULTURE, BAL-QUANTITATIVE W GRAM STAIN: Colony Count: 55000

## 2010-10-21 LAB — BLOOD GAS, ARTERIAL
Acid-base deficit: 0.8
Bicarbonate: 22.4
O2 Saturation: 96.4
Patient temperature: 98.6
TCO2: 23.4
pO2, Arterial: 79.6 — ABNORMAL LOW

## 2010-10-21 LAB — PROTIME-INR
INR: 1.9 — ABNORMAL HIGH
INR: 2 — ABNORMAL HIGH
INR: 2.6 — ABNORMAL HIGH
INR: 3.1 — ABNORMAL HIGH
INR: 2.5 — ABNORMAL HIGH
Prothrombin Time: 22 — ABNORMAL HIGH
Prothrombin Time: 24.1 — ABNORMAL HIGH
Prothrombin Time: 28.5 — ABNORMAL HIGH
Prothrombin Time: 13

## 2010-10-21 LAB — COMPREHENSIVE METABOLIC PANEL
ALT: 121 — ABNORMAL HIGH
AST: 34
AST: 46 — ABNORMAL HIGH
Albumin: 2.3 — ABNORMAL LOW
Albumin: 2.6 — ABNORMAL LOW
Alkaline Phosphatase: 141 — ABNORMAL HIGH
BUN: 18
BUN: 28 — ABNORMAL HIGH
BUN: 9
Calcium: 8.4
Calcium: 9.4
Chloride: 114 — ABNORMAL HIGH
Chloride: 97
Creatinine, Ser: 0.91
GFR calc Af Amer: >60
Glucose, Bld: 294 — ABNORMAL HIGH
Potassium: 4.1
Sodium: 135
Sodium: 147 — ABNORMAL HIGH
Total Bilirubin: 0.4
Total Protein: 5.8 — ABNORMAL LOW
Total Protein: 5.8 — ABNORMAL LOW
Total Protein: 7.1

## 2010-10-21 LAB — URINE MICROSCOPIC-ADD ON

## 2010-10-21 LAB — PHOSPHORUS: Phosphorus: 3.3

## 2010-10-21 LAB — FUNGUS CULTURE W SMEAR: Fungal Smear: NONE SEEN

## 2010-10-21 LAB — URINALYSIS, ROUTINE W REFLEX MICROSCOPIC
Nitrite: NEGATIVE
Specific Gravity, Urine: 1.037 — ABNORMAL HIGH
Urobilinogen, UA: 0.2

## 2010-10-21 LAB — URINE CULTURE
Colony Count: NO GROWTH
Culture: NO GROWTH

## 2010-10-21 LAB — DIFFERENTIAL
Lymphocytes Relative: 14
Lymphs Abs: 1.8
Monocytes Relative: 10
Neutro Abs: 9.9 — ABNORMAL HIGH
Neutrophils Relative %: 75

## 2010-10-21 LAB — HEMOGLOBIN A1C: Hgb A1c MFr Bld: 8.4 — ABNORMAL HIGH

## 2010-10-21 LAB — HSV PCR: HSV, PCR: NOT DETECTED

## 2010-10-21 LAB — VITAMIN B12: Vitamin B-12: 1246 — ABNORMAL HIGH (ref 211–911)

## 2010-10-21 LAB — CULTURE, BLOOD (ROUTINE X 2): Culture: NO GROWTH

## 2010-10-21 LAB — CULTURE, RESPIRATORY W GRAM STAIN: Culture: NORMAL

## 2010-10-21 LAB — MAGNESIUM: Magnesium: 1.8

## 2010-10-21 LAB — CSF CULTURE W GRAM STAIN: Gram Stain: NONE SEEN

## 2010-10-21 LAB — RPR: RPR Ser Ql: NONREACTIVE

## 2010-10-21 LAB — KETONES, QUALITATIVE: Acetone, Bld: NEGATIVE

## 2010-10-21 LAB — APTT: aPTT: 30

## 2010-10-21 LAB — INFLUENZA A+B VIRUS AG-DIRECT(RAPID)

## 2011-07-05 ENCOUNTER — Emergency Department (HOSPITAL_COMMUNITY): Payer: Medicaid Other

## 2011-07-05 ENCOUNTER — Emergency Department (HOSPITAL_COMMUNITY)
Admission: EM | Admit: 2011-07-05 | Discharge: 2011-07-05 | Disposition: A | Discharge status: Home / Self Care | Payer: Medicaid Other | Attending: Emergency Medicine | Admitting: Emergency Medicine

## 2011-07-05 ENCOUNTER — Encounter (HOSPITAL_COMMUNITY): Payer: Self-pay

## 2011-07-05 DIAGNOSIS — M545 Low back pain, unspecified: Secondary | ICD-10-CM

## 2011-07-05 DIAGNOSIS — E119 Type 2 diabetes mellitus without complications: Secondary | ICD-10-CM | POA: Insufficient documentation

## 2011-07-05 DIAGNOSIS — M949 Disorder of cartilage, unspecified: Secondary | ICD-10-CM | POA: Insufficient documentation

## 2011-07-05 DIAGNOSIS — IMO0002 Reserved for concepts with insufficient information to code with codable children: Secondary | ICD-10-CM

## 2011-07-05 DIAGNOSIS — M899 Disorder of bone, unspecified: Secondary | ICD-10-CM | POA: Insufficient documentation

## 2011-07-05 DIAGNOSIS — Z79899 Other long term (current) drug therapy: Secondary | ICD-10-CM | POA: Insufficient documentation

## 2011-07-05 DIAGNOSIS — I10 Essential (primary) hypertension: Secondary | ICD-10-CM | POA: Insufficient documentation

## 2011-07-05 DIAGNOSIS — M8580 Other specified disorders of bone density and structure, unspecified site: Secondary | ICD-10-CM

## 2011-07-05 HISTORY — DX: Essential (primary) hypertension: I10

## 2011-07-05 LAB — CBC
MCH: 28.8 pg (ref 26.0–34.0)
MCHC: 33.3 g/dL (ref 30.0–36.0)
MCV: 86.5 fL (ref 78.0–100.0)
Platelets: 239 10*3/uL (ref 150–400)
RDW: 12.6 % (ref 11.5–15.5)
WBC: 12.6 10*3/uL — ABNORMAL HIGH (ref 4.0–10.5)

## 2011-07-05 LAB — BASIC METABOLIC PANEL
BUN: 29 mg/dL — ABNORMAL HIGH (ref 6–23)
Creatinine, Ser: 1.01 mg/dL (ref 0.50–1.35)
GFR calc Af Amer: 89 mL/min — ABNORMAL LOW (ref >90)
GFR calc non Af Amer: 77 mL/min — ABNORMAL LOW (ref >90)
Glucose, Bld: 169 mg/dL — ABNORMAL HIGH (ref 70–99)

## 2011-07-05 LAB — SEDIMENTATION RATE: Sed Rate: 20 mm/hr — ABNORMAL HIGH (ref 0–16)

## 2011-07-05 LAB — URINALYSIS, ROUTINE W REFLEX MICROSCOPIC
Bilirubin Urine: NEGATIVE
Hgb urine dipstick: NEGATIVE
Ketones, ur: NEGATIVE mg/dL
Nitrite: NEGATIVE
Urobilinogen, UA: 1 mg/dL (ref 0.0–1.0)

## 2011-07-05 LAB — DIFFERENTIAL
Basophils Absolute: 0 10*3/uL (ref 0.0–0.1)
Basophils Relative: 0 % (ref 0–1)
Eosinophils Absolute: 0.2 10*3/uL (ref 0.0–0.7)
Eosinophils Relative: 1 % (ref 0–5)

## 2011-07-05 MED ORDER — HYDROCODONE-ACETAMINOPHEN 5-325 MG PO TABS: 1.0000 | ORAL_TABLET | Freq: Once | ORAL | Status: DC

## 2011-07-05 MED ORDER — HYDROCODONE-ACETAMINOPHEN 5-325 MG PO TABS
1.0000 | ORAL_TABLET | Freq: Once | ORAL | Status: AC
  Administered 2011-07-05: 1 via ORAL
  Filled 2011-07-05: qty 1

## 2011-07-05 MED ORDER — HYDROCODONE-ACETAMINOPHEN 5-325 MG PO TABS: 1.0000 | ORAL_TABLET | Freq: Four times a day (QID) | ORAL | Status: AC | PRN

## 2011-07-05 MED ORDER — ALUM & MAG HYDROXIDE-SIMETH 200-200-20 MG/5ML PO SUSP
15.0000 mL | Freq: Once | ORAL | Status: DC
  Filled 2011-07-05: qty 30

## 2011-07-05 NOTE — ED Notes (Signed)
Pt in xray

## 2011-07-05 NOTE — Discharge Instructions (Signed)
Read the information below.  Please call Dr Redmond School tomorrow to schedule a close follow up appointment.  If you develop weakness or numbness in your legs, uncontrolled pain, loss of control of your bowel or bladder, or fevers, return to the ER for a recheck.  You may return to the ER at any time for worsening condition or any new symptoms that concern you.   Back Pain, Adult Low back pain is very common. About 1 in 5 people have back pain.The cause of low back pain is rarely dangerous. The pain often gets better over time.About half of people with a sudden onset of back pain feel better in just 2 weeks. About 8 in 10 people feel better by 6 weeks.  CAUSES Some common causes of back pain include:  Strain of the muscles or ligaments supporting the spine.   Wear and tear (degeneration) of the spinal discs.   Arthritis.   Direct injury to the back.  DIAGNOSIS Most of the time, the direct cause of low back pain is not known.However, back pain can be treated effectively even when the exact cause of the pain is unknown.Answering your caregiver's questions about your overall health and symptoms is one of the most accurate ways to make sure the cause of your pain is not dangerous. If your caregiver needs more information, he or she may order lab work or imaging tests (X-rays or MRIs).However, even if imaging tests show changes in your back, this usually does not require surgery. HOME CARE INSTRUCTIONS For many people, back pain returns.Since low back pain is rarely dangerous, it is often a condition that people can learn to [facility] their own.   Remain active. It is stressful on the back to sit or stand in one place. Do not sit, drive, or stand in one place for more than 30 minutes at a time. Take short walks on level surfaces as soon as pain allows.Try to increase the length of time you walk each day.   Do not stay in bed.Resting more than 1 or 2 days can delay your recovery.   Do not avoid  exercise or work.Your body is made to move.It is not dangerous to be active, even though your back may hurt.Your back will likely heal faster if you return to being active before your pain is gone.   Pay attention to your body when you bend and lift. Many people have less discomfortwhen lifting if they bend their knees, keep the load close to their bodies,and avoid twisting. Often, the most comfortable positions are those that put less stress on your recovering back.   Find a comfortable position to sleep. Use a firm mattress and lie on your side with your knees slightly bent. If you lie on your back, put a pillow under your knees.   Only take over-the-counter or prescription medicines as directed by your caregiver. Over-the-counter medicines to reduce pain and inflammation are often the most helpful.Your caregiver may prescribe muscle relaxant drugs.These medicines help dull your pain so you can more quickly return to your normal activities and healthy exercise.   Put ice on the injured area.   Put ice in a plastic bag.   Place a towel between your skin and the bag.   Leave the ice on for 15 to 20 minutes, 3 to 4 times a day for the first 2 to 3 days. After that, ice and heat may be alternated to reduce pain and spasms.   Ask your caregiver about  trying back exercises and gentle massage. This may be of some benefit.   Avoid feeling anxious or stressed.Stress increases muscle tension and can worsen back pain.It is important to recognize when you are anxious or stressed and learn ways to manage it.Exercise is a great option.  SEEK MEDICAL CARE IF:  You have pain that is not relieved with rest or medicine.   You have pain that does not improve in 1 week.   You have new symptoms.   You are generally not feeling well.  SEEK IMMEDIATE MEDICAL CARE IF:   You have pain that radiates from your back into your legs.   You develop new bowel or bladder control problems.   You have  unusual weakness or numbness in your arms or legs.   You develop nausea or vomiting.   You develop abdominal pain.   You feel faint.  Document Released: 01/12/2005 Document Revised: 01/01/2011 Document Reviewed: 06/02/2010 [employer] Patient Information 2012 [place], [place].  Degenerative Disc Disease Degenerative disc disease is a condition caused by the changes that occur in the cushions of the backbone (spinal discs) as you grow older. Spinal discs are soft and compressible discs located between the bones of the spine (vertebrae). They act like shock absorbers. Degenerative disc disease can affect the wholespine. However, the neck and lower back are most commonly affected. Many changes can occur in the spinal discs with aging, such as:  The spinal discs may dry and shrink.   Small tears may occur in the tough, outer covering of the disc (annulus).   The disc space may become smaller due to loss of water.   Abnormal growths in the bone (spurs) may occur. This can put pressure on the nerve roots exiting the spinal canal, causing pain.   The spinal canal may become narrowed.  CAUSES  Degenerative disc disease is a condition caused by the changes that occur in the spinal discs with aging. The exact cause is not known, but there is a genetic basis for many patients. Degenerative changes can occur due to loss of fluid in the disc. This makes the disc thinner and reduces the space between the backbones. Small cracks can develop in the outer layer of the disc. This can lead to the breakdown of the disc. You are more likely to get degenerative disc disease if you are overweight. Smoking cigarettes and doing heavy work such as weightlifting can also increase your risk of this condition. Degenerative changes can start after a sudden injury. Growth of bone spurs can compress the nerve roots and cause pain.  SYMPTOMS  The symptoms vary from person to person. Some people may have no pain, while others  have severe pain. The pain may be so severe that it can limit your activities. The location of the pain depends on the part of your backbone that is affected. You will have neck or arm pain if a disc in the neck area is affected. You will have pain in your back, buttocks, or legs if a disc in the lower back is affected. The pain becomes worse while bending, reaching up, or with twisting movements. The pain may start gradually and then get worse as time passes. It may also start after a major or minor injury. You may feel numbness or tingling in the arms or legs.  DIAGNOSIS  Your caregiver will ask you about your symptoms and about activities or habits that may cause the pain. He or she may also ask about any  injuries, diseases, ortreatments you have had earlier. Your caregiver will examine you to check for the range of movement that is possible in the affected area, to check for strength in your extremities, and to check for sensation in the areas of the arms and legs supplied by different nerve roots. An X-ray of the spine may be taken. Your caregiver may suggest other imaging tests, such as a computerized magnetic scan (MRI), if needed.  TREATMENT  Treatment includes rest, modifying your activities, and applying ice and heat. Your caregiver may prescribe medicines to reduce your pain and may ask you to do some exercises to strengthen your back. In some cases, you may need surgery. You and your caregiver will decide on the treatment that is best for you. HOME CARE INSTRUCTIONS   Follow proper lifting and walking techniques as advised by your caregiver.   Maintain good posture.   Exercise regularly as advised.   Perform relaxation exercises.   Change your sitting, standing, and sleeping habits as advised. Change positions frequently.   Lose weight as advised.   Stop smoking if you smoke.   Wear supportive footwear.  SEEK MEDICAL CARE IF:  The pain does not go away within 1 to 4 weeks. SEEK  IMMEDIATE MEDICAL CARE IF:   The pain is severe.   You notice weakness in your arms, hands, or legs.   You begin to lose control of your bladder or bowel.  MAKE SURE YOU:   Understand these instructions.   Will watch your condition.   Will get help right away if you are not doing well or get worse.  Document Released: 11/09/2006 Document Revised: 01/01/2011 Document Reviewed: 11/09/2006 [facility] Patient Information 2012 [place], [place].

## 2011-07-05 NOTE — ED Notes (Signed)
Pt verbalizes understanding 

## 2011-07-05 NOTE — ED Notes (Signed)
C/o back pain x 4 weeks, pain 10/10, pt thinks pain is from his left hip replacement 4 years ago, per ems pt reports seeing commercial on TV regarding Hip replacement recalls, and pt would like to know if he qualify for recall settlement.

## 2011-07-05 NOTE — ED Provider Notes (Signed)
History     CSN: [id number]  Arrival date & time 07/05/11  4098   First MD Initiated Contact with Patient 07/05/11 1010      Chief Complaint  Patient presents with  . Back Pain    x 4 weeks, 10/10    (Consider location/radiation/quality/duration/timing/severity/associated sxs/prior treatment) HPI Comments: Pt reports worsening back pain over the past 3-4 weeks.  States he has chronic left hip problems since he had a hip replacement 4-5 years ago, spent some time in rehab and has progressively declined, first walking with a walker, and has now been in a wheelchair for 1 year.  States he has had back pain in the past "but never like this."  Pain is worse with any movement, with attempting to stand, and with laying flat.  Denies any injuries or recent falls. Denies fevers, abdominal pain, retention or incontinence of urine and stool, any change in his leg symptoms, no weakness or numbness of his legs.    Patient is a 66 y.o. male presenting with back pain. The history is provided by the patient.  Back Pain  Pertinent negatives include no chest pain, no fever, no abdominal pain and no dysuria.    Past Medical History  Diagnosis Date  . Hypertension   . Diabetes mellitus     Past Surgical History  Procedure Date  . Total hip arthroplasty     No family history on file.  History  Substance Use Topics  . Smoking status: Current Some Day Smoker  . Smokeless tobacco: Not on file  . Alcohol Use: No      Review of Systems  Constitutional: Negative for fever and chills.  HENT: Negative for neck pain.   Respiratory: Negative for shortness of breath.   Cardiovascular: Negative for chest pain.  Gastrointestinal: Negative for nausea, vomiting, abdominal pain and diarrhea.  Genitourinary: Negative for dysuria.  Musculoskeletal: Positive for back pain and gait problem.    Allergies  Review of patient's allergies indicates no known allergies.  Home Medications   Current  Outpatient Rx  Name Route Sig Dispense Refill  . ALBUTEROL SULFATE HFA 108 (90 BASE) MCG/ACT IN AERS Inhalation Inhale 2 puffs into the lungs every 6 (six) hours as needed.    . ASPIRIN 81 MG PO TABS Oral Take 81 mg by mouth daily.    . ATORVASTATIN CALCIUM 20 MG PO TABS Oral Take 20 mg by mouth daily.    Marland Kitchen GLIMEPIRIDE 4 MG PO TABS Oral Take 4 mg by mouth 2 (two) times daily.    . INSULIN GLARGINE 100 UNIT/ML [place] SOLN Subcutaneous Inject 74 Units into the skin at bedtime.    . TRAMADOL HCL 50 MG PO TABS Oral Take 50 mg by mouth every 6 (six) hours as needed.    . TRAZODONE HCL 50 MG PO TABS Oral Take 50 mg by mouth at bedtime.    Marland Kitchen VITAMIN B-12 1000 MCG PO TABS Oral Take 1,000 mcg by mouth daily.      BP 97/45  Pulse 103  Temp(Src) 98.6 F (37 C) (Oral)  Resp 18  SpO2 99%  Physical Exam  Nursing note and vitals reviewed. Constitutional: He is oriented to person, place, and time. He appears well-developed and well-nourished. No distress.  HENT:  Head: Normocephalic and atraumatic.  Neck: Normal range of motion. Neck supple.  Cardiovascular: Normal rate and regular rhythm.   Pulmonary/Chest: Effort normal and breath sounds normal.  Abdominal: Soft. He exhibits no distension and no  mass. There is no tenderness. There is no rebound and no guarding.  Musculoskeletal:       Right hip: Normal.       Left hip: He exhibits decreased range of motion and decreased strength. He exhibits no bony tenderness, no swelling, no crepitus and no deformity.       Cervical back: Normal.       Thoracic back: Normal.       Lumbar back: He exhibits tenderness and bony tenderness. He exhibits no swelling, no edema, no deformity, no laceration and no pain.       Back:       Left hip pain with passive ROM.  Strength of left leg 4/5, strength right leg 5/5.  Sensation intact bilaterally.  Distal pulses intact.  Lumbar spine diffusely tender, no point tenderness, no erythema, edema, warmth, no skin changes.  No  crepitus, no step-offs.    Neurological: He is alert and oriented to person, place, and time. He has normal strength. No sensory deficit.  Skin: He is not diaphoretic.    ED Course  Procedures (including critical care time)  Labs Reviewed - No data to display Dg Lumbar Spine Complete  07/05/2011  *RADIOLOGY REPORT*  Clinical Data: Back pain  LUMBAR SPINE - COMPLETE 4+ VIEW  Comparison: 07/20/2008  Findings: Five views of the lumbar spine submitted.  Study is limited by diffuse osteopenia and patient's large body habitus. Atherosclerotic calcifications of the abdominal aorta and the iliac arteries again noted.  Again noted disc space flattening with mild anterior spurring at L4-L5 level.  Again noted disc space flattening at L5-S1 level.  Facet degenerative changes at L3, L4 and L5 level.  IMPRESSION: No acute fracture or subluxation.  Degenerative changes as described above.  Limited study by patient body habitus and diffuse osteopenia.  Original Report Authenticated By: Natasha Mead, M.D.   Dg Hip Complete Left  07/05/2011  *RADIOLOGY REPORT*  Clinical Data: Back pain  LEFT HIP - COMPLETE 2+ VIEW  Comparison: 05/19/2007  Findings: Three views of the left hip submitted.  No acute fracture or subluxation.  Again noted left hip prosthesis in anatomic alignment.  The prosthesis material appears intact.  IMPRESSION: No acute fracture or subluxation.  Left hip prosthesis in  anatomic alignment.  Original Report Authenticated By: Natasha Mead, M.D.    12:11 PM Discussed pt with Dr Golda Acre.    Dr Golda Acre has also seen and examined patient, recommends d/c home with PCP follow up.    1. Low back pain   2. Osteopenia   3. Degenerative disc disease       MDM  Patient with 3-4 weeks of low back pain.  No injury or fall.  Pt with decreased function and decreased ambulation chronically due to left hip pain, prior hip replacement.  Pt does have mild decreased strength in left leg, which I believe is related to  his chronic left hip problems and possibly related to decreased function due to pain - pt himself denies any weakness in his leg.  No red flags for back pain.  Pt improved with norco.  Xrays show degenerative changes.  Pt d/c home with norco #20, PCP follow up Florentina Jenny).  Return precautions given.  Patient verbalizes understanding and agrees with plan.          Rise Patience, [place] 07/05/11 1600

## 2011-07-05 NOTE — ED Notes (Signed)
Pt provided with urinal and aware of the need for urine

## 2011-07-05 NOTE — ED Provider Notes (Signed)
Medical screening examination/treatment/procedure(s) were conducted as a shared visit with non-physician practitioner(s) and myself.  I personally evaluated the patient during the encounter  Patient with complaints of 3 weeks of left lower back pain.  No acute trauma or injury.  Patient notes that it's debilitating enough that he is unable to ambulate well because she's concerned he'll fall and he has been using a wheelchair.  Notably patient does have a hip replacement on that left side as well.  Patient denies any bowel or bladder planes.  Patient appeared to be slightly hypotensive and tachycardic and his initial vital signs although he was not symptomatic.  Patient was sitting upright in a chair and had normal mental status on my examination.  Patient did have x-rays completed which showed no acute fractures or lytic lesions.  Patient will have further laboratory studies sent given his vital signs in new onset of back pain to further assess for possible infection or other process.  Patient does have a primary care physician Dr. Redmond School whom he can followup with.  Nat Christen, MD 07/05/11 1246

## 2011-08-24 ENCOUNTER — Emergency Department (HOSPITAL_COMMUNITY): Payer: Medicaid Other

## 2011-08-24 ENCOUNTER — Encounter (HOSPITAL_COMMUNITY): Payer: Self-pay | Admitting: *Deleted

## 2011-08-24 ENCOUNTER — Ambulatory Visit
Admit: 2011-08-24 | Discharge: 2011-08-24 | Disposition: A | Discharge status: Home / Self Care | Attending: Radiation Oncology | Admitting: Radiation Oncology

## 2011-08-24 ENCOUNTER — Inpatient Hospital Stay (HOSPITAL_COMMUNITY): Payer: Medicaid Other

## 2011-08-24 ENCOUNTER — Inpatient Hospital Stay (HOSPITAL_COMMUNITY)
Admission: AD | Admit: 2011-08-24 | Discharge: 2011-08-27 | DRG: 544 | Discharge status: Against Medical Advice | Payer: Medicaid Other | Attending: Internal Medicine | Admitting: Internal Medicine

## 2011-08-24 DIAGNOSIS — Z79899 Other long term (current) drug therapy: Secondary | ICD-10-CM | POA: Insufficient documentation

## 2011-08-24 DIAGNOSIS — M899 Disorder of bone, unspecified: Secondary | ICD-10-CM

## 2011-08-24 DIAGNOSIS — M949 Disorder of cartilage, unspecified: Secondary | ICD-10-CM

## 2011-08-24 DIAGNOSIS — R29898 Other symptoms and signs involving the musculoskeletal system: Secondary | ICD-10-CM | POA: Diagnosis present

## 2011-08-24 DIAGNOSIS — Z7982 Long term (current) use of aspirin: Secondary | ICD-10-CM | POA: Insufficient documentation

## 2011-08-24 DIAGNOSIS — Z510 Encounter for antineoplastic radiation therapy: Secondary | ICD-10-CM | POA: Insufficient documentation

## 2011-08-24 DIAGNOSIS — M549 Dorsalgia, unspecified: Secondary | ICD-10-CM

## 2011-08-24 DIAGNOSIS — F17200 Nicotine dependence, unspecified, uncomplicated: Secondary | ICD-10-CM | POA: Diagnosis present

## 2011-08-24 DIAGNOSIS — E119 Type 2 diabetes mellitus without complications: Secondary | ICD-10-CM | POA: Diagnosis present

## 2011-08-24 DIAGNOSIS — M545 Low back pain, unspecified: Secondary | ICD-10-CM | POA: Diagnosis present

## 2011-08-24 DIAGNOSIS — C7951 Secondary malignant neoplasm of bone: Principal | ICD-10-CM | POA: Diagnosis present

## 2011-08-24 DIAGNOSIS — C7952 Secondary malignant neoplasm of bone marrow: Principal | ICD-10-CM | POA: Diagnosis present

## 2011-08-24 DIAGNOSIS — C801 Malignant (primary) neoplasm, unspecified: Secondary | ICD-10-CM | POA: Insufficient documentation

## 2011-08-24 DIAGNOSIS — Z720 Tobacco use: Secondary | ICD-10-CM | POA: Diagnosis present

## 2011-08-24 DIAGNOSIS — D497 Neoplasm of unspecified behavior of endocrine glands and other parts of nervous system: Secondary | ICD-10-CM

## 2011-08-24 DIAGNOSIS — I10 Essential (primary) hypertension: Secondary | ICD-10-CM | POA: Insufficient documentation

## 2011-08-24 DIAGNOSIS — W19XXXA Unspecified fall, initial encounter: Secondary | ICD-10-CM | POA: Diagnosis present

## 2011-08-24 HISTORY — DX: Dorsalgia, unspecified: M54.9

## 2011-08-24 LAB — CBC WITH DIFFERENTIAL/PLATELET
Eosinophils Relative: 1 % (ref 0–5)
HCT: 37.5 % — ABNORMAL LOW (ref 39.0–52.0)
Hemoglobin: 12.6 g/dL — ABNORMAL LOW (ref 13.0–17.0)
Lymphocytes Relative: 18 % (ref 12–46)
Lymphs Abs: 2 10*3/uL (ref 0.7–4.0)
MCV: 85.8 fL (ref 78.0–100.0)
Monocytes Absolute: 1.1 10*3/uL — ABNORMAL HIGH (ref 0.1–1.0)
Neutro Abs: 7.6 10*3/uL (ref 1.7–7.7)
RBC: 4.37 MIL/uL (ref 4.22–5.81)
WBC: 10.9 10*3/uL — ABNORMAL HIGH (ref 4.0–10.5)

## 2011-08-24 LAB — URINALYSIS, ROUTINE W REFLEX MICROSCOPIC
Glucose, UA: 100 mg/dL — AB
Hgb urine dipstick: NEGATIVE
Leukocytes, UA: NEGATIVE
Protein, ur: 30 mg/dL — AB
pH: 6 (ref 5.0–8.0)

## 2011-08-24 LAB — URINE MICROSCOPIC-ADD ON

## 2011-08-24 LAB — BASIC METABOLIC PANEL
CO2: 28 mEq/L (ref 19–32)
Calcium: 9.4 mg/dL (ref 8.4–10.5)
Chloride: 100 mEq/L (ref 96–112)
Creatinine, Ser: 0.88 mg/dL (ref 0.50–1.35)
Glucose, Bld: 128 mg/dL — ABNORMAL HIGH (ref 70–99)
Sodium: 136 mEq/L (ref 135–145)

## 2011-08-24 MED ORDER — SODIUM CHLORIDE 0.9 % IJ SOLN: 3.0000 mL | Freq: Two times a day (BID) | INTRAMUSCULAR | Status: DC

## 2011-08-24 MED ORDER — DEXAMETHASONE SODIUM PHOSPHATE 4 MG/ML IJ SOLN: 4.0000 mg | Freq: Four times a day (QID) | INTRAMUSCULAR | Status: DC

## 2011-08-24 MED ORDER — SODIUM CHLORIDE 0.9 % IV SOLN: 250.0000 mL | INTRAVENOUS | Status: DC | PRN

## 2011-08-24 MED ORDER — SODIUM CHLORIDE 0.9 % IV SOLN
INTRAVENOUS | Status: AC
  Administered 2011-08-24: 13:00:00 via INTRAVENOUS

## 2011-08-24 MED ORDER — TRAZODONE HCL 50 MG PO TABS
50.0000 mg | ORAL_TABLET | Freq: Every day | ORAL | Status: DC
  Administered 2011-08-25 – 2011-08-26 (×2): 50 mg via ORAL
  Filled 2011-08-24 (×4): qty 1

## 2011-08-24 MED ORDER — INSULIN GLARGINE 100 UNIT/ML ~~LOC~~ SOLN
70.0000 [IU] | Freq: Every day | SUBCUTANEOUS | Status: DC
  Administered 2011-08-24 – 2011-08-25 (×2): 70 [IU] via SUBCUTANEOUS

## 2011-08-24 MED ORDER — DEXAMETHASONE SODIUM PHOSPHATE 4 MG/ML IJ SOLN
4.0000 mg | Freq: Four times a day (QID) | INTRAMUSCULAR | Status: DC
  Administered 2011-08-24 – 2011-08-27 (×10): 4 mg via INTRAVENOUS
  Filled 2011-08-24 (×15): qty 1

## 2011-08-24 MED ORDER — ALBUTEROL SULFATE (5 MG/ML) 0.5% IN NEBU: 2.5000 mg | INHALATION_SOLUTION | Freq: Four times a day (QID) | RESPIRATORY_TRACT | Status: DC | PRN

## 2011-08-24 MED ORDER — MORPHINE SULFATE 4 MG/ML IJ SOLN
4.0000 mg | Freq: Once | INTRAMUSCULAR | Status: AC
  Administered 2011-08-24: 4 mg via INTRAVENOUS
  Filled 2011-08-24: qty 1

## 2011-08-24 MED ORDER — OXYCODONE-ACETAMINOPHEN 5-325 MG PO TABS
2.0000 | ORAL_TABLET | Freq: Once | ORAL | Status: AC
  Administered 2011-08-24: 2 via ORAL
  Filled 2011-08-24: qty 2

## 2011-08-24 MED ORDER — ONDANSETRON HCL 4 MG/2ML IJ SOLN: 4.0000 mg | Freq: Four times a day (QID) | INTRAMUSCULAR | Status: DC | PRN

## 2011-08-24 MED ORDER — ASPIRIN 81 MG PO CHEW
81.0000 mg | CHEWABLE_TABLET | Freq: Every day | ORAL | Status: DC
  Administered 2011-08-24 – 2011-08-27 (×4): 81 mg via ORAL
  Filled 2011-08-24 (×4): qty 1

## 2011-08-24 MED ORDER — IPRATROPIUM BROMIDE 0.02 % IN SOLN
0.5000 mg | Freq: Three times a day (TID) | RESPIRATORY_TRACT | Status: DC
  Filled 2011-08-24: qty 2.5

## 2011-08-24 MED ORDER — SODIUM CHLORIDE 0.9 % IJ SOLN: 3.0000 mL | INTRAMUSCULAR | Status: DC | PRN

## 2011-08-24 MED ORDER — SENNA 8.6 MG PO TABS
1.0000 | ORAL_TABLET | Freq: Two times a day (BID) | ORAL | Status: DC
  Administered 2011-08-24 – 2011-08-27 (×5): 8.6 mg via ORAL
  Filled 2011-08-24 (×6): qty 1

## 2011-08-24 MED ORDER — DEXAMETHASONE SODIUM PHOSPHATE 4 MG/ML IJ SOLN
8.0000 mg | Freq: Once | INTRAMUSCULAR | Status: AC
  Administered 2011-08-24: 8 mg via INTRAVENOUS
  Filled 2011-08-24: qty 2

## 2011-08-24 MED ORDER — ACETAMINOPHEN 325 MG PO TABS: 650.0000 mg | ORAL_TABLET | Freq: Four times a day (QID) | ORAL | Status: DC | PRN

## 2011-08-24 MED ORDER — ENOXAPARIN SODIUM 40 MG/0.4ML ~~LOC~~ SOLN
40.0000 mg | SUBCUTANEOUS | Status: DC
  Administered 2011-08-24 – 2011-08-26 (×3): 40 mg via SUBCUTANEOUS
  Filled 2011-08-24 (×4): qty 0.4

## 2011-08-24 MED ORDER — VITAMIN B-12 1000 MCG PO TABS
1000.0000 ug | ORAL_TABLET | Freq: Every day | ORAL | Status: DC
  Administered 2011-08-25 – 2011-08-27 (×3): 1000 ug via ORAL
  Filled 2011-08-24 (×3): qty 1

## 2011-08-24 MED ORDER — ONDANSETRON HCL 4 MG/2ML IJ SOLN: 4.0000 mg | Freq: Three times a day (TID) | INTRAMUSCULAR | Status: DC | PRN

## 2011-08-24 MED ORDER — IPRATROPIUM BROMIDE 0.02 % IN SOLN: 0.5000 mg | RESPIRATORY_TRACT | Status: DC | PRN

## 2011-08-24 MED ORDER — LORAZEPAM 2 MG/ML IJ SOLN
INTRAMUSCULAR | Status: AC
  Administered 2011-08-24: 1 mg
  Filled 2011-08-24: qty 1

## 2011-08-24 MED ORDER — TRAMADOL HCL 50 MG PO TABS: 50.0000 mg | ORAL_TABLET | Freq: Four times a day (QID) | ORAL | Status: DC | PRN

## 2011-08-24 MED ORDER — BISACODYL 5 MG PO TBEC: 5.0000 mg | DELAYED_RELEASE_TABLET | Freq: Every day | ORAL | Status: DC | PRN

## 2011-08-24 MED ORDER — ALBUTEROL SULFATE (5 MG/ML) 0.5% IN NEBU: 2.5000 mg | INHALATION_SOLUTION | RESPIRATORY_TRACT | Status: DC | PRN

## 2011-08-24 MED ORDER — ONDANSETRON HCL 4 MG PO TABS: 4.0000 mg | ORAL_TABLET | Freq: Four times a day (QID) | ORAL | Status: DC | PRN

## 2011-08-24 MED ORDER — HYDROMORPHONE HCL PF 1 MG/ML IJ SOLN: 1.0000 mg | INTRAMUSCULAR | Status: DC | PRN

## 2011-08-24 MED ORDER — ACETAMINOPHEN 650 MG RE SUPP: 650.0000 mg | Freq: Four times a day (QID) | RECTAL | Status: DC | PRN

## 2011-08-24 MED ORDER — IPRATROPIUM BROMIDE 0.02 % IN SOLN
0.5000 mg | Freq: Once | RESPIRATORY_TRACT | Status: AC
  Administered 2011-08-24: 0.5 mg via RESPIRATORY_TRACT
  Filled 2011-08-24: qty 2.5

## 2011-08-24 MED ORDER — SODIUM CHLORIDE 0.9 % IV BOLUS (SEPSIS)
500.0000 mL | Freq: Once | INTRAVENOUS | Status: AC
  Administered 2011-08-24: 500 mL via INTRAVENOUS

## 2011-08-24 MED ORDER — HYDROCODONE-ACETAMINOPHEN 5-325 MG PO TABS
1.0000 | ORAL_TABLET | ORAL | Status: DC | PRN
  Administered 2011-08-24 – 2011-08-27 (×9): 2 via ORAL
  Filled 2011-08-24 (×9): qty 2

## 2011-08-24 MED ORDER — MORPHINE SULFATE 2 MG/ML IJ SOLN
1.0000 mg | INTRAMUSCULAR | Status: DC | PRN
  Administered 2011-08-24: 1 mg via INTRAVENOUS
  Filled 2011-08-24: qty 1

## 2011-08-24 MED ORDER — ALBUTEROL SULFATE (5 MG/ML) 0.5% IN NEBU
5.0000 mg | INHALATION_SOLUTION | Freq: Once | RESPIRATORY_TRACT | Status: AC
  Administered 2011-08-24: 5 mg via RESPIRATORY_TRACT
  Filled 2011-08-24: qty 1

## 2011-08-24 MED ORDER — ATORVASTATIN CALCIUM 20 MG PO TABS
20.0000 mg | ORAL_TABLET | Freq: Every day | ORAL | Status: DC
  Administered 2011-08-24 – 2011-08-26 (×3): 20 mg via ORAL
  Filled 2011-08-24 (×4): qty 1

## 2011-08-24 MED ORDER — SODIUM CHLORIDE 0.9 % IJ SOLN
3.0000 mL | Freq: Two times a day (BID) | INTRAMUSCULAR | Status: DC
  Administered 2011-08-24 – 2011-08-27 (×4): 3 mL via INTRAVENOUS

## 2011-08-24 MED ORDER — LORAZEPAM 2 MG/ML IJ SOLN
2.0000 mg | Freq: Once | INTRAMUSCULAR | Status: AC
  Administered 2011-08-24: 2 mg via INTRAVENOUS
  Filled 2011-08-24: qty 1

## 2011-08-24 MED ORDER — GLIMEPIRIDE 4 MG PO TABS
4.0000 mg | ORAL_TABLET | Freq: Two times a day (BID) | ORAL | Status: DC
  Administered 2011-08-24 – 2011-08-27 (×6): 4 mg via ORAL
  Filled 2011-08-24 (×8): qty 1

## 2011-08-24 MED ORDER — INSULIN ASPART 100 UNIT/ML ~~LOC~~ SOLN
0.0000 [IU] | Freq: Three times a day (TID) | SUBCUTANEOUS | Status: DC
  Administered 2011-08-24 – 2011-08-25 (×3): 2 [IU] via SUBCUTANEOUS
  Administered 2011-08-25: 5 [IU] via SUBCUTANEOUS
  Administered 2011-08-26: 7 [IU] via SUBCUTANEOUS
  Administered 2011-08-26: 3 [IU] via SUBCUTANEOUS

## 2011-08-24 NOTE — ED Notes (Signed)
MRI cancelled, CT ordered by B Laveda Norman PA.

## 2011-08-24 NOTE — ED Notes (Signed)
Patient taken to MRI

## 2011-08-24 NOTE — Consult Note (Signed)
[facility] Radiation Oncology NEW PATIENT EVALUATION  Name: [place] [name] MRN: [id number]  Date:   08/24/2011           DOB: Aug 06, 1945  Status: inpatient   CC: Florentina Jenny, MD  Cephas Darby, MD   REFERRING PHYSICIAN: Cephas Darby, MD  DIAGNOSIS: The primary encounter diagnosis was Back pain. Diagnoses of Fall, Lytic lesion of bone on x-ray, and Hypertension were also pertinent to this visit.    HISTORY OF PRESENT ILLNESS:  Bradley [name] is a 66 y.o. male who is seen today for an initial consultation visit. The patient has multiple comorbidities including poorly controlled diabetes. The patient indicates at least a several month history in terms of some increasing difficulty with walking. The patient relates greater weakness within the left leg as opposed to the right. This has been a slow worsening process he indicates over several months. The patient also complains of severe lower back pain centrally within the lower back centered at approximately the mid lumbar region.  The patient's workup included a chest x-ray which did not reveal any acute process. A lumbar spine revealed some underlying sclerotic changes of L1 and L4 as well as some mild vertebral body height loss at T12, L1, and L4. The CT scan of the lumbar spine revealed lytic and mixed lytic and sclerotic lesions throughout the region of the skeleton image. There appeared to be extraosseous tumor within the ventral spinal canal and the intervertebral foramen on the left at L4.  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 Additional views were ordered but the patient was unable to complete the examination.  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   PREVIOUS RADIATION THERAPY: No   PAST MEDICAL HISTORY:  has a past medical history of  Hypertension; Diabetes mellitus; and Back pain.     PAST SURGICAL HISTORY:  Past Surgical History  Procedure Date  . Total hip arthroplasty   . Toe resections      FAMILY HISTORY: family history is not on file.   SOCIAL HISTORY:  reports that he has been smoking.  He has never used smokeless tobacco. He reports that he does not drink alcohol or use illicit drugs.   ALLERGIES: Review of patient's allergies indicates no known allergies.   MEDICATIONS:  Current Facility-Administered Medications  Medication Dose Route Frequency Provider Last Rate Last Dose  . 0.9 %  sodium chloride infusion   Intravenous STAT Fayrene Helper, PA-C 75 mL/hr at 08/24/11 1301    . 0.9 %  sodium chloride infusion  250 mL Intravenous PRN Kela Millin, MD      . acetaminophen (TYLENOL) tablet 650 mg  650 mg Oral Q6H PRN Adeline C Viyuoh, MD       Or  . acetaminophen (TYLENOL) suppository 650 mg  650 mg Rectal Q6H PRN Adeline C Viyuoh, MD      . albuterol (PROVENTIL) (5 MG/ML) 0.5% nebulizer solution 2.5 mg  2.5 mg Nebulization Q4H PRN Adeline C Viyuoh, MD      . albuterol (PROVENTIL) (5 MG/ML) 0.5% nebulizer solution 5 mg  5 mg Nebulization Once Fayrene Helper, PA-C   5 mg at 08/24/11 1000  . aspirin chewable tablet 81 mg  81 mg Oral Daily Kela Millin, MD   81 mg at 08/24/11 1855  . atorvastatin (LIPITOR)  tablet 20 mg  20 mg Oral QPC supper Kela Millin, MD   20 mg at 08/24/11 1855  . bisacodyl (DULCOLAX) EC tablet 5 mg  5 mg Oral Daily PRN Adeline C Viyuoh, MD      . dexamethasone (DECADRON) injection 4 mg  4 mg Intravenous Q6H Adeline C Viyuoh, MD   4 mg at 08/24/11 1855  . dexamethasone (DECADRON) injection 8 mg  8 mg Intravenous Once Gwenyth Bender, NP   8 mg at 08/24/11 1301  . enoxaparin (LOVENOX) injection 40 mg  40 mg Subcutaneous Q24H Adeline C Viyuoh, MD   40 mg at 08/24/11 1856  . glimepiride (AMARYL) tablet 4 mg  4 mg Oral BID AC Adeline C Viyuoh, MD   4 mg at 08/24/11 1855  .  HYDROcodone-acetaminophen (NORCO/VICODIN) 5-325 MG per tablet 1-2 tablet  1-2 tablet Oral Q4H PRN Kela Millin, MD   2 tablet at 08/24/11 2133  . insulin aspart (novoLOG) injection 0-9 Units  0-9 Units Subcutaneous TID WC Kela Millin, MD   2 Units at 08/24/11 1856  . insulin glargine (LANTUS) injection 70 Units  70 Units Subcutaneous QHS Kela Millin, MD   70 Units at 08/24/11 2202  . ipratropium (ATROVENT) nebulizer solution 0.5 mg  0.5 mg Nebulization Once Fayrene Helper, PA-C   0.5 mg at 08/24/11 1000  . ipratropium (ATROVENT) nebulizer solution 0.5 mg  0.5 mg Nebulization Q4H PRN Adeline C Viyuoh, MD      . LORazepam (ATIVAN) 2 MG/ML injection        1 mg at 08/24/11 0645  . LORazepam (ATIVAN) injection 2 mg  2 mg Intravenous Once Gwenyth Bender, NP   2 mg at 08/24/11 1341  . morphine 2 MG/ML injection 1-2 mg  1-2 mg Intravenous Q4H PRN Kela Millin, MD   1 mg at 08/24/11 1906  . morphine 4 MG/ML injection 4 mg  4 mg Intravenous Once Gwenyth Bender, NP   4 mg at 08/24/11 1319  . ondansetron (ZOFRAN) tablet 4 mg  4 mg Oral Q6H PRN Adeline C Viyuoh, MD       Or  . ondansetron (ZOFRAN) injection 4 mg  4 mg Intravenous Q6H PRN Adeline C Viyuoh, MD      . oxyCODONE-acetaminophen (PERCOCET/ROXICET) 5-325 MG per tablet 2 tablet  2 tablet Oral Once Arman Filter, NP   2 tablet at 08/24/11 0305  . senna (SENOKOT) tablet 8.6 mg  1 tablet Oral BID Kela Millin, MD   8.6 mg at 08/24/11 2127  . sodium chloride 0.9 % bolus 500 mL  500 mL Intravenous Once Fayrene Helper, PA-C   500 mL at 08/24/11 1112  . sodium chloride 0.9 % injection 3 mL  3 mL Intravenous Q12H Kela Millin, MD   3 mL at 08/24/11 2127  . sodium chloride 0.9 % injection 3 mL  3 mL Intravenous PRN Adeline C Viyuoh, MD      . sodium chloride 0.9 % injection 3 mL  3 mL Intravenous Q12H Adeline C Viyuoh, MD      . traMADol (ULTRAM) tablet 50 mg  50 mg Oral Q6H PRN Adeline C Viyuoh, MD      . traZODone (DESYREL) tablet 50 mg  50  mg Oral QHS Adeline C Viyuoh, MD      . vitamin B-12 (CYANOCOBALAMIN) tablet 1,000 mcg  1,000 mcg Oral Daily Kela Millin, MD      .  DISCONTD: albuterol (PROVENTIL) (5 MG/ML) 0.5% nebulizer solution 2.5 mg  2.5 mg Nebulization Q6H PRN Fayrene Helper, PA-C      . DISCONTD: dexamethasone (DECADRON) injection 4 mg  4 mg Intravenous Q6H Gwenyth Bender, NP      . DISCONTD: HYDROmorphone (DILAUDID) injection 1 mg  1 mg Intravenous Q4H PRN Fayrene Helper, PA-C      . DISCONTD: ipratropium (ATROVENT) nebulizer solution 0.5 mg  0.5 mg Nebulization TID Kela Millin, MD      . DISCONTD: ondansetron (ZOFRAN) injection 4 mg  4 mg Intravenous Q8H PRN Fayrene Helper, PA-C         REVIEW OF SYSTEMS:  Pertinent items are noted in HPI.    PHYSICAL EXAM:  height is 5\' 8"  (1.727 m) and weight is 199 lb (90.266 kg). His oral temperature is 98.6 F (37 C). His blood pressure is 111/61 and his pulse is 92. His respiration is 20 and oxygen saturation is 92%.   Cardiovascular: Regular rate and rhythm Respiratory: Clear to auscultation Abdomen: Nontender, normal bowel sounds present Extremities: Partial amputation of the right foot, no edema Neuro: The patient has 5/5 strength in the right lower extremity and the upper extremities bilaterally. 4/5 strength in the left lower extremity both proximally and distally.   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 No tissue diagnosis at this point. The patient has some weakness in the left leg, which has been chronic  over the last several months at least per the patient's report.   PLAN: The patient has been seen by medical oncology. Additional imaging has been ordered including completing an MRI scan of the spine and a CT scan of the chest. The patient has also been started on Decadron.  I agree with the above. The patient does appear based on my review of his imaging to have some compromise involving the lumbar region. It is not clear at this point that this represents substantial spinal cord compression, as this may represent involvement of the nerve roots/foramen as well. This has been a very chronic process. The patient also has significant pain in the lumbar spine centrally.  Further workup is pending and I would favor awaiting a final pathologic diagnosis prior to proceeding with treatment given his presentation. The patient's presentation is highly suspicious for malignancy and they would however anticipate likely beginning radiation treatment to at least the lumbar spine after additional information has been obtained. I discussed this possibility with the patient and also the additional workup which is being planned. The best area for biopsy has yet to be determined in may depend in large part on the results of his CT scan of the chest.  I will follow along with the patient and we will proceed accordingly, potentially beginning a palliative course of radiation treatment in the near future.   I spent 30 minutes minutes face to face with the patient and more  than 50% of that time was spent in counseling and/or coordination of care.

## 2011-08-24 NOTE — Progress Notes (Signed)
[facility] ED CM noted CM consult. CM review cm consult with pt for home health. Pt states he wants d/c home.  Pt states he has already called [facility] to set up services and was told they would respond to him in 10 days.  Pt reports he has not heard from them.  CM called [facility] [phone] and spoke with Ivery Quale who will have the RN return a call back to CM if pt is scheduled to be seen (awaiting CME call per [place]). CM shared with patient the [facility] [place] home health agency list to review if there is no response from [facility] Encouraged pt to let CM staff know his preference/choice of agency so he can be followed while hospitalized.  Pt reports his home being "dirty" now Agencies that are Medicare-Certified and are affiliated with [facility]  Telephone Number Address  [facility]   The [facility] has ownership interest in this company; however, you are under no obligation to use this agency. [phone] or  [phone] [address] [place], [place] [address]   Agencies that are Medicare-Certified and are not affiliated with [facility] Telephone Number Address  [facility] [phone] Fax [phone] [address] [place], [place]  [address]  [facility] [phone] or [phone] Fax [phone] [address] [place], [place] [address]  [facility] [phone] Fax [phone] [address] [place], [place] [address]  [facility] [phone] Fax [phone] [address] [place], [place]  [address]  [facility] [phone] Fax [phone] [address], Suite [place], [place] [address]  [facility]  [phone] [address] [place], [place] [address]  [facility] [phone]  [address] [place], [place] [address]  [facility] [phone] or [phone] Fax [phone] [address] [place], [place]  [address]  [facility] [phone] Fax [phone] [address] [place], [place]  [address]  [facility]  [phone] Fax [phone] [address] [place], [place] [address]

## 2011-08-24 NOTE — Care Management Note (Unsigned)
    Page 1 of 1   08/26/2011     5:17:52 PM   CARE MANAGEMENT NOTE 08/26/2011  Patient:  Bradley [name], Bradley [name]   Account Number:  [id number]  Date Initiated:  08/24/2011  Documentation initiated by:  Lanier Clam  Subjective/Objective Assessment:   ADMITTED W/LEG WEAKNESS.     Action/Plan:   FROM HOME ALONE   Anticipated DC Date:  08/27/2011   Anticipated DC Plan:  HOME W HOME HEALTH SERVICES      DC Planning Services  CM consult      Choice offered to / List presented to:  C-1 Patient           Status of service:  In process, will continue to follow Medicare Important Message given?   (If response is "NO", the following Medicare IM given date fields will be blank) Date Medicare IM given:   Date Additional Medicare IM given:    Discharge Disposition:    Per UR Regulation:  Reviewed for med. necessity/level of care/duration of stay  If discussed at Long Length of Stay Meetings, dates discussed:    Comments:  08/26/11 Saanya Zieske RN,BSN NCM [phone] I CONTACTED [facility](J[facility] [place]OCK)TEL#[phone],PATIENT IS NOT W/THEIR SERVICE. PER PATIENT PERMISSION CALLED [facility] TEL#[phone] TO FIND OUT IF PATIENT HAD AN AUTHORIZED PERSONAL CARE SERVICE,PATIENT IS NOT AUTHORIZED CURRENTLY W/ANY PERSONAL CARE SERVICE,HE MUST SUBMIT REFERRAL FROM HIS PCP.[facility] CHOSEN FOR HH RECOMMEND HHRN,NURSE'S AIDE,SPOKE TO SUSAN DALE([facility]) AWARE OF REFERRAL,& RECOMMENDATIONS(THEY WILL ASSIST PATIENT W/THE PROCESS W/PCS SERVICE).IF MD AGREE PLEASE PUT IN HHC ORDER,& FACE TO FACE.  08/24/11 Scarlet Abad RN,BSN NCM [phone]

## 2011-08-24 NOTE — Progress Notes (Signed)
Bradley [name], is a 66 y.o. male,   MRN: [id number]  -  DOB - 01/31/1945  Outpatient Primary MD for the patient is No primary provider on file.  in for    Chief Complaint  Patient presents with  . Fall     Blood pressure 93/53, pulse 80, temperature 98.9 F (37.2 C), temperature source Oral, resp. rate 18, SpO2 96.00%.  Principal Problem:  *Leg weakness, bilateral Active Problems:  DM (diabetes mellitus)  Spinal cord tumor  66 yo hx DM, HTN, chronic back pain presents ED fall this am. Work up yields lumbar CT with tumor within ventral spinal canal and intervertebral foamen on L L4 as weill as lytic lesions throughout the region of skeletal area. Slightly elevated WC, chest xray no evidence of acute cardiopulmonary process but on exam audible wheeze. Hemodynamically stable. Will admit to tele.   Spoke with Dr Cyndie Chime while in ED 1:05pm. Recommended decedron, MRI and once cord compression ruled out will need CT chest. Also recommended IV morphine and Ativan for pain and anxiety of MRI

## 2011-08-24 NOTE — H&P (Signed)
[facility] History and Physical  Bradley [name] OZH:[id number] DOB: Nov 07, 1945 DOA: 08/24/2011  Referring physician: Dr. Alfonso Ellis PCP: Florentina Jenny, MD   Chief Complaint: Worsening back pain and leg weakness- status post fall  HPI:  The patient is a 66 year old white male with past medical history significant for chronic low back pain(for 4-5 years and since she fell while drinking alcohol), diabetes mellitus, hypertension who states he has not seen him his primary care physician in about 2 years and a present as above complaints. He states that for the past couple of months he developed and leg weakness, uses a wheelchair for the most part-and that today as he was trying to go to the bathroom he fell, stating his legs just give out on him. He denies fevers dysuria or cough, shortness of breath diarrhea melena and no hematochezia. He was seen in the ED and CT of the lumbar spine was done and showed lytic and lytic/sclerotic lesions throughout the region of the skeleton imaged. Findings consistent with metastatic disease, less likely myeloma. 25% loss of height at L1 and L4 and minimal loss of height at L5. Extraosseous tumor within the ventral spinal canal and the intravertebral foramina on the left at L4. Oncology was consulted and patient started on Decadron and MRI of thoracic and lumbar spine ordered, radiation oncology also consulted. He is admitted for further evaluation and management.  Review of Systems:  The patient denies anorexia, fever, weight loss,, vision loss, decreased hearing, hoarseness, chest pain, syncope, dyspnea on exertion, peripheral edema, balance deficits, hemoptysis, abdominal pain, melena, hematochezia, severe indigestion/heartburn, hematuria, incontinence, suspicious skin lesions, transient blindness, depression, unusual weight change, abnormal bleeding, enlarged lymph nodes, angioedema, and breast masses.   Past Medical History  Diagnosis Date  . Hypertension   .  Diabetes mellitus   . Back pain    Past Surgical History  Procedure Date  . Total hip arthroplasty   . Toe resections    Social History:  reports that he has been smoking.  He has never used smokeless tobacco. He reports that he does not drink alcohol or use illicit drugs.  where does patient live--home,  No Known Allergies  History reviewed. No pertinent family history.   Prior to Admission medications   Medication Sig Start Date End Date Taking? Authorizing Provider  albuterol (PROVENTIL HFA;VENTOLIN HFA) 108 (90 BASE) MCG/ACT inhaler Inhale 2 puffs into the lungs every 6 (six) hours as needed.   Yes Historical Provider, MD  aspirin 81 MG tablet Take 81 mg by mouth daily.   Yes Historical Provider, MD  atorvastatin (LIPITOR) 20 MG tablet Take 20 mg by mouth daily.   Yes Historical Provider, MD  glimepiride (AMARYL) 4 MG tablet Take 4 mg by mouth 2 (two) times daily.   Yes Historical Provider, MD  insulin glargine (LANTUS SOLOSTAR) 100 UNIT/ML injection Inject 74 Units into the skin at bedtime.   Yes Historical Provider, MD  traMADol (ULTRAM) 50 MG tablet Take 50 mg by mouth every 6 (six) hours as needed.   Yes Historical Provider, MD  traZODone (DESYREL) 50 MG tablet Take 50 mg by mouth at bedtime.   Yes Historical Provider, MD  vitamin B-12 (CYANOCOBALAMIN) 1000 MCG tablet Take 1,000 mcg by mouth daily.   Yes Historical Provider, MD   Physical Exam: Filed Vitals:   08/24/11 0214 08/24/11 0215 08/24/11 0216 08/24/11 1105  BP:  106/60 93/53 127/63  Pulse: 80 96 80 88  Temp:   98.9  F (37.2 C) 98.2 F (36.8 C)  TempSrc:   Oral Oral  Resp: 18  18 18   SpO2:  95% 96% 95%   Constitutional: Vital signs reviewed.  Elderly white male, disheveled in no respiratory distress cooperative with exam. Alert and oriented x3.  Head: Normocephalic and atraumatic Mouth: no erythema or exudates, MMM Eyes: PERRL, EOMI, conjunctivae normal, No scleral icterus.  Neck: Supple, Trachea midline  normal ROM, No JVD, mass, thyromegaly, or carotid bruit present.  Cardiovascular: RRR, S1 normal, S2 normal, no MRG, pulses symmetric and intact bilaterally Pulmonary/Chest: Occasional wheezes, decreased air entry bilaterally rales, or rhonchi Abdominal: Soft. Non-tender, non-distended, bowel sounds are normal, no masses, organomegaly, or guarding present.  Extremities: s/p Right forefoot amputation, no cyanosis and no edema  Hematology: no cervical, inginal, or axillary adenopathy.  Neurological: A&O x3, he is unable to do that left straight leg raise secondary to pain, strength in R. lower extremity is 4-5 out of 5, and strength in right upper extremities bilaterally 5 out of 5, cranial nerve II-XII are grossly intact, sensory intact to light touch bilaterally.  Skin: Warm, dry and intact. No rash.  Psychiatric: Normal mood and affect.   Labs on Admission:  Basic Metabolic Panel:  Lab 08/24/11 1610  NA 136  K 4.3  CL 100  CO2 28  GLUCOSE 128*  BUN 19  CREATININE 0.88  CALCIUM 9.4  MG --  PHOS --   Liver Function Tests: No results found for this basename: AST:5,ALT:5,ALKPHOS:5,BILITOT:5,PROT:5,ALBUMIN:5 in the last 168 hours No results found for this basename: LIPASE:5,AMYLASE:5 in the last 168 hours No results found for this basename: AMMONIA:5 in the last 168 hours CBC:  Lab 08/24/11 0940  WBC 10.9*  NEUTROABS 7.6  HGB 12.6*  HCT 37.5*  MCV 85.8  PLT 244   Cardiac Enzymes: No results found for this basename: CKTOTAL:5,CKMB:5,CKMBINDEX:5,TROPONINI:5 in the last 168 hours  BNP (last 3 results) No results found for this basename: PROBNP:3 in the last 8760 hours CBG: No results found for this basename: GLUCAP:5 in the last 168 hours  Radiological Exams on Admission: Dg Chest 2 View  08/24/2011  *RADIOLOGY REPORT*  Clinical Data: Fall, shortness of breath  CHEST - 2 VIEW  Comparison: 11/29/2008  Findings: Lungs are clear. No pleural effusion or pneumothorax.  Mild  cardiomegaly.  Degenerative changes of the visualized thoracolumbar spine.  IMPRESSION: No evidence of acute cardiopulmonary disease.  Original Report Authenticated By: Charline Bills, M.D.   Dg Lumbar Spine Complete  08/24/2011  *RADIOLOGY REPORT*  Clinical Data: Fall, low back pain.  LUMBAR SPINE - COMPLETE 4+ VIEW  Comparison: 07/05/2011  Findings: There is mild vertebral body height loss at T12, L1, and L4.  Underlying sclerotic changes at L1 and L4. No retropulsion. Multilevel degenerative changes.  No dislocation.  Advanced atherosclerotic calcification.  IMPRESSION: Mild vertebral body height loss at T12, L1, and L4.  Underlying sclerotic changes of L1 and L4, therefore pathologic fracture/metastatic disease not excluded.  Recommend MRI follow-up.  Original Report Authenticated By: Waneta Martins, M.D.   Ct Lumbar Spine Wo Contrast  08/24/2011  *RADIOLOGY REPORT*  Clinical Data: Larey Seat.  Back pain.  CT LUMBAR SPINE WITHOUT CONTRAST  Technique:  Multidetector CT imaging of the lumbar spine was performed without intravenous contrast administration. Multiplanar CT image reconstructions were also generated.  Comparison: Radiography same day, 07/05/2011, 07/20/2008.  Findings: Lower portion of T12 is intact.  There is lytic destructive change of the left twelfth rib.  L1:  Mixed lytic/sclerotic destructive change with loss of height of 25%.  No discernible intraspinal tumor.  L2:  Early lytic destruction of the posterior aspect of the vertebral body.  No loss of height.  No definable extraosseous tumor.  L3:  Early lytic destructive areas within the vertebral body.  No discernible extraosseous tumor or loss of height.  L4:  A mixed lytic/sclerotic destructive change with loss of height of 25%.  The tumor bulging into the ventral aspect of the spinal canal and involving the intervertebral foramen on the left.  L5:  Mixed lytic/sclerotic destructive change with loss of height of about 10%.  No apparent  extraosseous tumor.  Sacrum and iliac bones:  Probable areas of mixed lytic and sclerotic tumor involvement.  No sign of extraosseous tumor.  IMPRESSION: Lytic and lytic/sclerotic lesions throughout the region of the skeleton imaged.  Findings consistent with metastatic disease or less likely myeloma.  25% loss of height at L1 and L4 and minimal loss of height that L5.  Extraosseous tumor within the ventral spinal canal and the intervertebral foramen on the left at L4.  Critical Value/emergent results were called by telephone at the time of interpretation on 08/24/2011 at  0900 hours to physician's assistant Laveda Norman, who verbally acknowledged these results.  Original Report Authenticated By: Thomasenia Sales, M.D.   Mr Thoracic Spine Wo Contrast  08/24/2011  *RADIOLOGY REPORT*  Clinical Data: Emergency department patient with acute back pain. Evaluate for cord compression.  History of diabetes and spinal cord tumor.  Lumbar CT earlier today demonstrated probable osseous metastatic disease.  MRI THORACIC SPINE WITHOUT CONTRAST  Technique:  Multiplanar and multiecho pulse sequences of the thoracic spine were obtained without intravenous contrast.  Comparison: Lumbar spine CT 08/24/2011.  Findings: The patient was not able to complete the examination due to claustrophobia and pain.  Only sagittal imaging was obtained.  There is widespread osseous metastatic disease to the cervical thoracic spine.  Involvement of the thoracic region is greatest at T1, T5 and T12.  There is involvement of the posterior elements at multiple levels.  However, no pathologic fracture or significant epidural tumor is apparent.  There is no foraminal compromise or definite cord deformity in the thoracic region. Cervical assessment is limited.  No abnormal cord signal is seen.  There are no obvious paraspinal abnormalities as imaged in the sagittal plane.  IMPRESSION:  1.  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 2.   No pathologic fracture, cord deformity or significant foraminal compromise identified in the thoracic spine.  Cervical assessment is limited.  The patient was not able to complete the examination.  No sagittal or postcontrast imaging was obtained. Consider CT of the chest, abdomen and pelvis for further evaluation of the osseous metastatic disease and assessment for a possible primary lesion.  Original Report Authenticated By: Gerrianne Scale, M.D.      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 -Will continue Decadron as recommended, pain management and follow. -Follow and obtain CT scan of chest to evaluate for possible lung primary given patient's tobacco abuse -Radiation oncology consulted per  Heme onc, follow for further recs Active Problems:  DM (diabetes mellitus) -Continue Lantus and Amaryl, monitor Accu-Cheks and cover with sliding scale insulin.  LBP (low back pain) -Secondary to above, as discussed above.  Fall -Follow and consult PT OT  Tobacco abuse -Counseled to quit tobacco    Disposition Plan: admit to Tele  Time spent: >2mins  Kela Millin [facility] Pager [phone]  If 7PM-7AM, please contact night-coverage [email or web address] Password [facility] 08/24/2011, 3:31 PM

## 2011-08-24 NOTE — Consult Note (Signed)
Voice recognition system not working:  Report dictated into transcription pool.  Summary: 66 y/o hx ETOH abuse, smoker, diabetic, has not seen an MD in 2 years, states he has been wheelchair bound for 3 months but able to ambulate until early this AM when he got up and fell down -left leg weak.  Increasing back pain for months. Previous lumbar spine X-rays from 07/21/08 negative except DJD. Current films with suspicious sclerotic changes at L1 & L4.  CT spine, which I personally reviewed with Radiologist, shows dramatic lytic bone lesions throughout the lumbar spine with presence of soft tissue mass at left L4 bulging into the ventral aspect of the spinal canal. CXR negative except COPD.  Exam: Motor strength 5/5 RLE. 4/5 LLE: unable to straight leg raise & exam limited by pain Reflexes absent but symmetric at the knees.  Impression: Findings highly suspicious for metastatic cancer unclear primary, with early spinal cord compromise at L4.   Recommend: Decadron 8 mg IV stat then 4 mg IV Q6h MRI  Of Thoracic & Lumbar spine Given smoking history, I would get a CT chest to look for a primary tumor despite negative chest X-ray. We will need to establish a tissue diagnosis. If chest CT negative, would ask interventional Radiology to assist with a biopsy of a spine lesion. I would ask Radiation Oncology to see early & be ready to treat as soon as tissue obtained.  Thanks  Will follow discussed w Surveyor, mining

## 2011-08-24 NOTE — ED Notes (Signed)
JYN:[id number]<AO> Expected date:08/24/11<BR> Expected time: 1:57 AM<BR> Means of arrival:Ambulance<BR> Comments:<BR> Fall, neck pain.

## 2011-08-24 NOTE — ED Notes (Signed)
Previous vitals were EMS pre-arrival.

## 2011-08-24 NOTE — ED Provider Notes (Signed)
Pt fell this evening.  C/o back pain.  If MRI neg, pt stable to be discharged per Elsie Stain, NP  7:02 AM  Falling from standing height in bathroom.  Denies hitting head or LOC.  On exam, pt is obese, with audible wheezing. Heart RRR, S1S2, Lung scattered wheezes throughout lung fields, abd distended but nontender, back: tenderness to midback midline spine tenderness without deformity or step off, decreased ROM due to pain, lower extremity with decreased sensation to light touch throughout.  1+ patella DTR bilat. 4/5 strength to lower legs.  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  Will give albuterol/atrovent nebs.  Pt awaiting imaging.  Patient is unable to tolerate MRI scan despite receiving 1 mg of Ativan. I discussed with my attending who recommend having a L-spine CT for further evaluation of his fall from standing position.    9:05 AM Radiologist called to notified that pt has multiple pathologic lesions throughout his Lspine.  Consider cancer, unknown source.  Pt will need to be admitted for further work up.  Discussed with my attending.    Past Medical History significant for diabetes, alcoholic abuse.    Patient reports having progressive pain to his low back down to his lower extremities for the past several months. He also noticed increased weakness to both his legs. For the past several weeks he has been using wheelchair to move around as he cannot stand on his legs. He also has been coughing productive with yellow sputum for the past several weeks. Admits to be a half a pack a day smoker. State he has quit drinking alcohol for the past several months. Denies any history of cancers, or family history of cancer. Endorse weight gain but denies night sweats.  Denies urinary or bowel incontinence or saddle anesthesia. His PCP is Florentina Jenny, but has not seen his doctor in 2 years.    Pt refuse rectal exam, stating "i don't have prostate cancer".  Will defer exam.    10:25 AM I  have consulted with [facility], who agrees to admit patient to telemetry bed, Team 8, Under the care of dr. Virginia Rochester for further workup of lytic lesions concerning for cancer.  Pt is made aware of plan.    Results for orders placed during the hospital encounter of 08/24/11  CBC WITH DIFFERENTIAL      Component Value Range   WBC 10.9 (*) 4.0 - 10.5 K/uL   RBC 4.37  4.22 - 5.81 MIL/uL   Hemoglobin 12.6 (*) 13.0 - 17.0 g/dL   HCT 40.9 (*) 81.1 - 91.4 %   MCV 85.8  78.0 - 100.0 fL   MCH 28.8  26.0 - 34.0 pg   MCHC 33.6  30.0 - 36.0 g/dL   RDW 78.2  95.6 - 21.3 %   Platelets 244  150 - 400 K/uL   Neutrophils Relative 70  43 - 77 %   Neutro Abs 7.6  1.7 - 7.7 K/uL   Lymphocytes Relative 18  12 - 46 %   Lymphs Abs 2.0  0.7 - 4.0 K/uL   Monocytes Relative 10  3 - 12 %   Monocytes Absolute 1.1 (*) 0.1 - 1.0 K/uL   Eosinophils Relative 1  0 - 5 %   Eosinophils Absolute 0.1  0.0 - 0.7 K/uL   Basophils Relative 0  0 - 1 %   Basophils Absolute 0.0  0.0 - 0.1 K/uL  BASIC METABOLIC PANEL      Component  Value Range   Sodium 136  135 - 145 mEq/L   Potassium 4.3  3.5 - 5.1 mEq/L   Chloride 100  96 - 112 mEq/L   CO2 28  19 - 32 mEq/L   Glucose, Bld 128 (*) 70 - 99 mg/dL   BUN 19  6 - 23 mg/dL   Creatinine, Ser 2.84  0.50 - 1.35 mg/dL   Calcium 9.4  8.4 - 13.2 mg/dL   GFR calc non Af Amer 89 (*) >90 mL/min   GFR calc Af Amer >90  >90 mL/min   Dg Chest 2 View  08/24/2011  *RADIOLOGY REPORT*  Clinical Data: Fall, shortness of breath  CHEST - 2 VIEW  Comparison: 11/29/2008  Findings: Lungs are clear. No pleural effusion or pneumothorax.  Mild cardiomegaly.  Degenerative changes of the visualized thoracolumbar spine.  IMPRESSION: No evidence of acute cardiopulmonary disease.  Original Report Authenticated By: Charline Bills, M.D.   Dg Lumbar Spine Complete  08/24/2011  *RADIOLOGY REPORT*  Clinical Data: Fall, low back pain.  LUMBAR SPINE - COMPLETE 4+ VIEW  Comparison: 07/05/2011   Findings: There is mild vertebral body height loss at T12, L1, and L4.  Underlying sclerotic changes at L1 and L4. No retropulsion. Multilevel degenerative changes.  No dislocation.  Advanced atherosclerotic calcification.  IMPRESSION: Mild vertebral body height loss at T12, L1, and L4.  Underlying sclerotic changes of L1 and L4, therefore pathologic fracture/metastatic disease not excluded.  Recommend MRI follow-up.  Original Report Authenticated By: Waneta Martins, M.D.   Ct Lumbar Spine Wo Contrast  08/24/2011  *RADIOLOGY REPORT*  Clinical Data: Larey Seat.  Back pain.  CT LUMBAR SPINE WITHOUT CONTRAST  Technique:  Multidetector CT imaging of the lumbar spine was performed without intravenous contrast administration. Multiplanar CT image reconstructions were also generated.  Comparison: Radiography same day, 07/05/2011, 07/20/2008.  Findings: Lower portion of T12 is intact.  There is lytic destructive change of the left twelfth rib.  L1: Mixed lytic/sclerotic destructive change with loss of height of 25%.  No discernible intraspinal tumor.  L2:  Early lytic destruction of the posterior aspect of the vertebral body.  No loss of height.  No definable extraosseous tumor.  L3:  Early lytic destructive areas within the vertebral body.  No discernible extraosseous tumor or loss of height.  L4:  A mixed lytic/sclerotic destructive change with loss of height of 25%.  The tumor bulging into the ventral aspect of the spinal canal and involving the intervertebral foramen on the left.  L5:  Mixed lytic/sclerotic destructive change with loss of height of about 10%.  No apparent extraosseous tumor.  Sacrum and iliac bones:  Probable areas of mixed lytic and sclerotic tumor involvement.  No sign of extraosseous tumor.  IMPRESSION: Lytic and lytic/sclerotic lesions throughout the region of the skeleton imaged.  Findings consistent with metastatic disease or less likely myeloma.  25% loss of height at L1 and L4 and minimal loss  of height that L5.  Extraosseous tumor within the ventral spinal canal and the intervertebral foramen on the left at L4.  Critical Value/emergent results were called by telephone at the time of interpretation on 08/24/2011 at  0900 hours to physician's assistant Laveda Norman, who verbally acknowledged these results.  Original Report Authenticated By: Thomasenia Sales, M.D.      Fayrene Helper, PA-C 08/24/11 1026

## 2011-08-24 NOTE — ED Notes (Signed)
Per EMS: patient from home with c/o fall from standing position while trying to get up from the toilet. Per EMS, the pts legs "gave out". Pt has hx of mid level thoracic pain x 28mo. Pain 8/10. Abrasions on wrist & elbow are from a previous encounter.

## 2011-08-24 NOTE — ED Provider Notes (Signed)
Medical screening examination/treatment/procedure(s) were performed by non-physician practitioner and as supervising physician I was immediately available for consultation/collaboration.   Celene Kras, MD 08/24/11 1550

## 2011-08-24 NOTE — Progress Notes (Signed)
Pt states he has been seen by Dr Florentina Jenny but not in last 2 years per pt He states he prefers not to be seen again by Dr Redmond School nor his RN nor any other MD.  Recently he was informed a RN would be coming to see him.   Pt reports he is unable to leave his home to go to MD appointments Pt reports he does not have a DSS case worker nor does he recall a name of another MD listed on his medicaid card.

## 2011-08-24 NOTE — ED Notes (Signed)
Pt. Aware of giving a urine specimen. Pt is unable to give a specimen at this time. Notified nurse.

## 2011-08-24 NOTE — Progress Notes (Signed)
[facility] choose [facility]) to follow him while hospitalized for possible d/c services if [facility] returns a response of not being able to assist pt.  [facility] ED CM spoke with Darl Pikes [facility] coordinator to make referral for possible services Pt will be followed and clarify services if needed at d/c

## 2011-08-25 ENCOUNTER — Inpatient Hospital Stay (HOSPITAL_COMMUNITY): Payer: Medicaid Other

## 2011-08-25 ENCOUNTER — Encounter (HOSPITAL_COMMUNITY): Payer: Self-pay | Admitting: Radiology

## 2011-08-25 DIAGNOSIS — W19XXXA Unspecified fall, initial encounter: Secondary | ICD-10-CM

## 2011-08-25 DIAGNOSIS — R32 Unspecified urinary incontinence: Secondary | ICD-10-CM

## 2011-08-25 DIAGNOSIS — K560 Paralytic ileus: Secondary | ICD-10-CM

## 2011-08-25 LAB — GLUCOSE, CAPILLARY
Glucose-Capillary: 272 mg/dL — ABNORMAL HIGH (ref 70–99)
Glucose-Capillary: 162 mg/dL — ABNORMAL HIGH (ref 70–99)
Glucose-Capillary: 197 mg/dL — ABNORMAL HIGH (ref 70–99)
Glucose-Capillary: 263 mg/dL — ABNORMAL HIGH (ref 70–99)

## 2011-08-25 LAB — COMPREHENSIVE METABOLIC PANEL
ALT: 20 U/L (ref 0–53)
AST: 13 U/L (ref 0–37)
Alkaline Phosphatase: 490 U/L — ABNORMAL HIGH (ref 39–117)
CO2: 25 mEq/L (ref 19–32)
GFR calc Af Amer: >90 mL/min (ref >90)
GFR calc non Af Amer: >90 mL/min (ref >90)
Glucose, Bld: 166 mg/dL — ABNORMAL HIGH (ref 70–99)
Potassium: 4.3 mEq/L (ref 3.5–5.1)
Sodium: 133 mEq/L — ABNORMAL LOW (ref 135–145)

## 2011-08-25 MED ORDER — LORAZEPAM 2 MG/ML IJ SOLN
2.0000 mg | Freq: Once | INTRAMUSCULAR | Status: AC
  Administered 2011-08-25: 2 mg via INTRAVENOUS
  Filled 2011-08-25: qty 1

## 2011-08-25 MED ORDER — INSULIN GLARGINE 100 UNIT/ML ~~LOC~~ SOLN: 74.0000 [IU] | Freq: Every day | SUBCUTANEOUS | Status: DC

## 2011-08-25 MED ORDER — MORPHINE SULFATE 10 MG/ML IJ SOLN
5.0000 mg | Freq: Once | INTRAMUSCULAR | Status: AC
  Administered 2011-08-25: 5 mg via INTRAVENOUS
  Filled 2011-08-25: qty 1

## 2011-08-25 MED ORDER — IOHEXOL 300 MG/ML  SOLN
80.0000 mL | Freq: Once | INTRAMUSCULAR | Status: AC | PRN
  Administered 2011-08-25: 80 mL via INTRAVENOUS

## 2011-08-25 NOTE — Progress Notes (Signed)
[facility] PROGRESS NOTE  Bradley [name] XBM:[id number] DOB: 1945/04/05 DOA: 08/24/2011 PCP: Florentina Jenny, MD  Assessment/Plan: Principal Problem:  *Leg weakness, bilateral Active Problems:  DM (diabetes mellitus)  Spinal cord tumor  LBP (low back pain)  Fall  Tobacco abuse 1.  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 -CT chest with NO primary lung tumoe to explain pt's metastatic bone disease, will obtain PSA to further eval, lumbar spine with diffuse metastatic disease -Will continue Decadron as recommended, pain management and follow.  -appreciate rad onc assistance, to plan palliative radiation -PT/OT consult  Active Problems:  DM (diabetes mellitus)  -poor control, increase Lantus and Amaryl, monitor Accu-Cheks and cover with sliding scale insulin.  LBP (low back pain)  -Secondary to above, as discussed above.  Fall  -consult PT OT  Tobacco abuse  -Counseled to quit tobacco Elevated Alk phos -likely 2/2 to metastatic disease  Brief narrative: The patient is a 66 year old white male with past medical history significant for chronic low back pain(for 4-5 years and since he fell while drinking alcohol- denies any alochol in the past 6-90mos ), diabetes mellitus, hypertension who presented s/p fall with worsening of back pain and leg weakness.CT of the lumbar spine done in ED showed lytic and lytic/sclerotic lesions throughout the region of the skeleton imaged. Findings consistent with metastatic disease, less likely myeloma. 25% loss of height at L1 and L4 and minimal loss of height at L5. Extraosseous tumor within the ventral spinal canal and the intravertebral foramina on the left at L4. Oncology was consulted and patient started on Decadron, rad onc was also  consulted.    Consultants: Heme onc- Dr Cyndie Chime Rad onc - Dr Lorin Picket  Procedures:  none  Antibiotics:  none  HPI/Subjective: States back pain better today, denies any new c/o  Objective: Filed Vitals:   08/24/11 1600 08/24/11 2149 08/25/11 0437 08/25/11 1415  BP: 156/82 111/61 160/80 151/92  Pulse: 98 92 98 95  Temp: 98.6 F (37 C) 98.6 F (37 C) 98.5 F (36.9 C) 98.5 F (36.9 C)  TempSrc: Oral Oral Oral Oral  Resp: 20 20 18 20   Height:      Weight:      SpO2: 95% 92% 93% 96%    Intake/Output Summary (Last 24 hours) at 08/25/11 1917 Last data filed at 08/25/11 1500  Gross per 24 hour  Intake    840 ml  Output   1000 ml  Net   -160 ml    Exam:   General:  Elderly wm in NAD  Cardiovascular: RRR, nl S1S2  Respiratory: decreased BS at bases, no crackles, no wheezes  Abdomen: soft, +BS nontender, ND  Extremities: R. With forefoot amputation, no cyanosis, no edema  Neuro:LLE weakness, CN2-12 grossly intact  Data Reviewed: Basic Metabolic Panel:  Lab 08/25/11 0272 08/24/11 0940  NA 133* 136  K 4.3 4.3  CL 97 100  CO2 25 28  GLUCOSE 166* 128*  BUN 25* 19  CREATININE 0.77 0.88  CALCIUM 9.7 9.4  MG -- --  PHOS -- --   Liver Function Tests:  Lab 08/25/11 0805  AST 13  ALT 20  ALKPHOS 490*  BILITOT 0.2*  PROT 7.3  ALBUMIN 3.6   No results found for this basename: LIPASE:5,AMYLASE:5 in the last 168 hours No results  found for this basename: AMMONIA:5 in the last 168 hours CBC:  Lab 08/24/11 0940  WBC 10.9*  NEUTROABS 7.6  HGB 12.6*  HCT 37.5*  MCV 85.8  PLT 244   Cardiac Enzymes: No results found for this basename: CKTOTAL:5,CKMB:5,CKMBINDEX:5,TROPONINI:5 in the last 168 hours BNP (last 3 results) No results found for this basename: PROBNP:3 in the last 8760 hours CBG:  Lab 08/25/11 1656 08/25/11 1205 08/25/11 0900 08/24/11 2142 08/24/11 1640  GLUCAP 197* 162* 272* 363* 162*    No results found for this or any previous  visit (from the past 240 hour(s)).   Studies: Dg Chest 2 View  08/24/2011  *RADIOLOGY REPORT*  Clinical Data: Fall, shortness of breath  CHEST - 2 VIEW  Comparison: 11/29/2008  Findings: Lungs are clear. No pleural effusion or pneumothorax.  Mild cardiomegaly.  Degenerative changes of the visualized thoracolumbar spine.  IMPRESSION: No evidence of acute cardiopulmonary disease.  Original Report Authenticated By: Charline Bills, M.D.   Dg Lumbar Spine Complete  08/24/2011  *RADIOLOGY REPORT*  Clinical Data: Fall, low back pain.  LUMBAR SPINE - COMPLETE 4+ VIEW  Comparison: 07/05/2011  Findings: There is mild vertebral body height loss at T12, L1, and L4.  Underlying sclerotic changes at L1 and L4. No retropulsion. Multilevel degenerative changes.  No dislocation.  Advanced atherosclerotic calcification.  IMPRESSION: Mild vertebral body height loss at T12, L1, and L4.  Underlying sclerotic changes of L1 and L4, therefore pathologic fracture/metastatic disease not excluded.  Recommend MRI follow-up.  Original Report Authenticated By: Waneta Martins, M.D.   Ct Chest W Contrast  08/25/2011  *RADIOLOGY REPORT*  Clinical Data: Evaluate for lung mass.  Metastatic disease with unknown primary.  CT CHEST WITH CONTRAST  Technique:  Multidetector CT imaging of the chest was performed following the standard protocol during bolus administration of intravenous contrast.  Contrast: 80mL OMNIPAQUE IOHEXOL 300 MG/ML  SOLN  Comparison: Thoracic spine MRI 08/24/2011 and MRI lumbar spine 08/25/2011.  Chest radiograph 08/24/2011  Findings: Normal heart size.  Atherosclerotic calcification of the coronary arteries, and of the normal caliber thoracic aorta.  Trace right pleural effusion.  Negative for left pleural effusion or pericardial effusion.  Borderline prominent 9 mm prevascular lymph node on image number 24.  No additional borderline or enlarged mediastinal, hilar, or axillary lymph nodes.  Negative for  supraclavicular lymphadenopathy.  Soft tissues of the breasts demonstrate mild bilateral gynecomastia.  No evidence of breast mass.  Esophagus is mildly distended with areas distal aspect, but otherwise unremarkable.  Normal appearance of the visualized portion of the thyroid gland.  Mild respiratory motion is slightly limits the evaluation of the lung parenchyma.  The trachea and mainstem bronchi are patent. Negative for emphysema.  There is no pulmonary mass or nodule. Negative for airspace disease.  There are multiple irregular bony lesions, with a mixed lytic and sclerotic pattern consistent with diffuse bony metastatic disease. These lesions include the ribs bilaterally, the visualized thoracic and visualized upper lumbar vertebral bodies.  The largest focal lesions involve T5, where there is focal destruction of the posterior cortex and soft tissue extension into the epidural space in the midline and to the left of midline, and L1 where there is diffuse sclerosis and lucency throughout the vertebral body, and an associated compression fracture deformity.  The most prominent areas of bony metastatic disease involving the ribs include the right third rib anteriorly where there is expansion of the cortex, and the left posterolateral fifth rib where there  is a central lucent lesion, with associated pathologic fracture through the inferior cortex. Small sclerotic focus in the body of the sternum, for which a bony metastasis cannot be excluded (image #57 of the sagittal reformats).  Imaging of the upper abdomen demonstrates mild bilateral adrenal gland hyperplasia, left greater than right, without a focal adrenal gland mass.  The imaged portion of the liver enhances normally shows no evidence of mass.  The spleen is normal.  The imaged portion of the pancreas is normal.  The stomach has normal appearances.  No upper abdominal lymphadenopathy is identified.  No skin lesions are seen.  IMPRESSION:  1.  Negative for  pulmonary nodule or mass.  A primary lung malignancy to explain the patient's diffuse bony metastatic disease is not identified on this chest CT, and there is no evidence of pulmonary metastatic disease. 2.  Single upper normal/mildly prominent prevascular lymph node (9 mm). 3.  Diffuse osseous metastatic disease involving the visualized vertebral bodies and multiple bilateral ribs, and possible small sternal metastasis.  There is destruction of the posterior cortex of T5 with extension of soft tissue density into the epidural space, suspicious for focal epidural spread of tumor.  There is extensive involvement of the L1 vertebral body with vertebral body compression fracture, as described on prior MRI. 4.  Pathologic fracture through a metastatic lesion in the left fifth rib.  Original Report Authenticated By: Britta Mccreedy, M.D.   Ct Lumbar Spine Wo Contrast  08/24/2011  *RADIOLOGY REPORT*  Clinical Data: Larey Seat.  Back pain.  CT LUMBAR SPINE WITHOUT CONTRAST  Technique:  Multidetector CT imaging of the lumbar spine was performed without intravenous contrast administration. Multiplanar CT image reconstructions were also generated.  Comparison: Radiography same day, 07/05/2011, 07/20/2008.  Findings: Lower portion of T12 is intact.  There is lytic destructive change of the left twelfth rib.  L1: Mixed lytic/sclerotic destructive change with loss of height of 25%.  No discernible intraspinal tumor.  L2:  Early lytic destruction of the posterior aspect of the vertebral body.  No loss of height.  No definable extraosseous tumor.  L3:  Early lytic destructive areas within the vertebral body.  No discernible extraosseous tumor or loss of height.  L4:  A mixed lytic/sclerotic destructive change with loss of height of 25%.  The tumor bulging into the ventral aspect of the spinal canal and involving the intervertebral foramen on the left.  L5:  Mixed lytic/sclerotic destructive change with loss of height of about 10%.  No  apparent extraosseous tumor.  Sacrum and iliac bones:  Probable areas of mixed lytic and sclerotic tumor involvement.  No sign of extraosseous tumor.  IMPRESSION: Lytic and lytic/sclerotic lesions throughout the region of the skeleton imaged.  Findings consistent with metastatic disease or less likely myeloma.  25% loss of height at L1 and L4 and minimal loss of height that L5.  Extraosseous tumor within the ventral spinal canal and the intervertebral foramen on the left at L4.  Critical Value/emergent results were called by telephone at the time of interpretation on 08/24/2011 at  0900 hours to physician's assistant Laveda Norman, who verbally acknowledged these results.  Original Report Authenticated By: Thomasenia Sales, M.D.   Mr Thoracic Spine Wo Contrast  08/24/2011  *RADIOLOGY REPORT*  Clinical Data: Emergency department patient with acute back pain. Evaluate for cord compression.  History of diabetes and spinal cord tumor.  Lumbar CT earlier today demonstrated probable osseous metastatic disease.  MRI THORACIC SPINE WITHOUT CONTRAST  Technique:  Multiplanar and multiecho pulse sequences of the thoracic spine were obtained without intravenous contrast.  Comparison: Lumbar spine CT 08/24/2011.  Findings: The patient was not able to complete the examination due to claustrophobia and pain.  Only sagittal imaging was obtained.  There is widespread osseous metastatic disease to the cervical thoracic spine.  Involvement of the thoracic region is greatest at T1, T5 and T12.  There is involvement of the posterior elements at multiple levels.  However, no pathologic fracture or significant epidural tumor is apparent.  There is no foraminal compromise or definite cord deformity in the thoracic region. Cervical assessment is limited.  No abnormal cord signal is seen.  There are no obvious paraspinal abnormalities as imaged in the sagittal plane.  IMPRESSION:  1.  Widespread osseous metastatic disease to the cervical thoracic  spine. 2.  No pathologic fracture, cord deformity or significant foraminal compromise identified in the thoracic spine.  Cervical assessment is limited.  The patient was not able to complete the examination.  No sagittal or postcontrast imaging was obtained. Consider CT of the chest, abdomen and pelvis for further evaluation of the osseous metastatic disease and assessment for a possible primary lesion.  Original Report Authenticated By: Gerrianne Scale, M.D.   Mr Lumbar Spine Wo Contrast  08/25/2011  *RADIOLOGY REPORT*  Clinical Data: Eval for cord compromise from tumor.  MRI thoracic spine performed yesterday.  Bony metastatic disease.  MRI LUMBAR SPINE WITHOUT CONTRAST  Technique:  Multiplanar and multiecho pulse sequences of the lumbar spine were obtained without intravenous contrast.  Comparison: CT 08/24/2011.  Findings: The patient was able with complete sagittal T1 and T2- weighted imaging however axial imaging could not be performed due to intolerance of scanning.  The lumbar spine shows diffuse metastatic disease.  The metastatic infiltration is most pronounced at L1 and L4.  Every level is involved.  There is no definite cauda equina compression identified.  Bilateral foraminal stenosis is present at L4-L5 associated with extraosseous extension of tumor into both neural foramina, potentially affecting both L4 nerve roots, left greater than right.  L1 compression fracture appears similar to CT 08/24/2011.  IMPRESSION: 1.  The patient was only able to complete sagittal imaging T1 and T2 sequences.  There is no gross cauda equina compression. 2.  Diffuse metastatic disease, most prominent at L4-L5.  Although the central canal appears grossly patent, there is bilateral foraminal stenosis, left greater than right associated with extraosseous extension of tumor. 3.  L1 and L4 pathologic compression fractures appears similar to prior.  Original Report Authenticated By: Andreas Newport, M.D.    Scheduled  Meds:   . sodium chloride   Intravenous STAT  . aspirin  81 mg Oral Daily  . atorvastatin  20 mg Oral QPC supper  . dexamethasone  4 mg Intravenous Q6H  . enoxaparin (LOVENOX) injection  40 mg Subcutaneous Q24H  . glimepiride  4 mg Oral BID AC  . insulin aspart  0-9 Units Subcutaneous TID WC  . insulin glargine  70 Units Subcutaneous QHS  . LORazepam  2 mg Intravenous Once  . morphine  5 mg Intravenous Once  . senna  1 tablet Oral BID  . sodium chloride  3 mL Intravenous Q12H  . sodium chloride  3 mL Intravenous Q12H  . traZODone  50 mg Oral QHS  . vitamin B-12  1,000 mcg Oral Daily  . DISCONTD: ipratropium  0.5 mg Nebulization TID   Continuous Infusions:   Principal Problem:  *Leg  weakness, bilateral Active Problems:  DM (diabetes mellitus)  Spinal cord tumor  LBP (low back pain)  Fall  Tobacco abuse    Time spent:32mins    Kela Millin  [facility] Pager [phone]. If 8PM-8AM, please contact night-coverage at [email or web address], password [facility] 08/25/2011, 7:17 PM  LOS: 1 day

## 2011-08-25 NOTE — Progress Notes (Signed)
RT Onc consult appreciated. Unfortunately, only cervical and thoracic spine imaged on MRI due to patient intolerance. This shows extensive disease of entire spine. He agrees to proceed with lumbar MRI & CT today. Neuro exam is stable with trace weakness LLE in flexion & with straight leg maneuvers. He has an ileus which may be sign of cord compromise. He was incontinent of urine and foley catheter placed. We don't have a full chemistry profile so I will order this as well as a myeloma panel. Continue decadron pending further evaluation and pursuit of diagnostic biopsy. Poor vascular acess - can change to PO decadron if IV falls out.

## 2011-08-25 NOTE — Progress Notes (Signed)
[facility] ED CM received a return call from Viviano Simas [phone]) from [facility] stating he has not received authorization for [facility] services as of 08/25/11

## 2011-08-25 NOTE — Progress Notes (Signed)
Was informed by monitor tech that patient has a PVC. Patient denies any chest pain/distress.  vitals- 126/71 91, 94%-RA. Dr. Suanne Marker notified, will continue to assess patient.

## 2011-08-25 NOTE — Consult Note (Signed)
Bradley [name], Bradley [name]                ACCOUNT NO.:  [id number]  MEDICAL RECORD NO.:  [id number]  LOCATION:  1406                         FACILITY:  [facility]  PHYSICIAN:  Levert Feinstein, M.D., F.A.C.P.DATE OF BIRTH: 05/11/1945  DATE OF CONSULTATION:  08/24/2011 DATE OF DISCHARGE:                                CONSULTATION   This is a Medical Oncology consultation requested to evaluate this man for what appears to be acute compromise of his spinal cord.  HISTORY OF PRESENT ILLNESS:  Bradley [name] is a 66 year old man with hypertension, diabetes, obstructive airway disease, with a history of alcohol abuse, noncompliance with medication, and he is a former heavy smoker.  He has not seen a doctor in about 2 years.  He lives alone.  He has cut himself off from his family support.  He states that he has been in a wheelchair for about three months, but he has been able to ambulate until early this morning when he got up and fell down due to back pain and leg weakness.  He reports increasing back pain for a number of months.  He attributes all of this to a hip replacement that was done four years ago.  He had a brief admission on July 21, 2008 for sudden onset of leg weakness.  X-rays of the lumbar spine at that time showed changes consistent with degenerative arthritis, but were otherwise normal.  Lumbar x-rays done in the emergency department today showed early compressive changes at T12, L1, and L4 with suspicious sclerotic changes at L1 and L4.  A CT scan of the spine was done, which I personally reviewed with the radiologist.  This shows rather dramatic, lytic, and sclerotic lesions throughout the lumbar and sacral spine with extraosseous soft tissue mass effect at the left side of L4 bulging into the ventral aspect of the spinal canal.  There are no complete compression fractures and no retropulsion of bone into the canal.  A chest radiograph is unremarkable except for changes of  obstructive airway disease.  He is being admitted on an urgent basis for further evaluation to rule out impending spinal cord compression.  PAST MEDICAL HISTORY:  In addition to above, he denies any history of MI, hepatitis, yellow jaundice, tuberculosis, seizure, he believes he had a mild stroke in the past.  No blood clots.  He has not been taking his diabetic or antihypertensives medicines on a regular basis.  He was taking oral agents and insulin in the past.  He was admitted in November of 2006 with cellulitis and an abscess of his right foot, requiring incision and drainage.  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  He was admitted on April 24, 2007 with a traumatic left femoral neck fracture and required hip arthroplasty by Dr. Vanita Panda. Magnus Ivan.  He was admitted in April 2009 with pneumonia and acute respiratory failure, requiring intubation with infectious agent found to be influenza A.  He had mental status changes during that admission, but overall negative CT brain and spinal fluid analysis.  Brief admission on July 21, 2008 for sudden weakness of his legs felt to  be nonspecific and related to electrolyte abnormalities.  A November 2010 admission for syncope felt to be noncardiac and probably vasovagal in nature.  ALLERGIES:  No allergies.  FAMILY HISTORY:  He has 4 sisters, but he has cut off contact with them. Parents are deceased.  SOCIAL HISTORY:  He is divorced.  He lives alone.  He has no children. He has had some of his medical care through [facility]. He also had a visiting physician Dr. Redmond School involved with his care, but has not seen him in 2 years.  He used to smoke 1-2 packs of cigarettes daily, now down to a half a pack a day.  He states his alcohol consumption is limited to beer and he has not had a drink in about 5 months.  PHYSICAL EXAMINATION:  GENERAL:  Shows an  unkempt man looking older than his stated age. VITAL SIGNS:  Blood pressure 106/60, pulse 96 and regular, respirations 18, temperature 98.9. SKIN:  There is an abrasion over his right knee. HEENT:  The pupils are equal, round, reactive to light.  Pharynx, no erythema or exudate.  Conjunctivae inflamed bilaterally.  Oropharynx without erythema, exudate, or mass. NECK:  Full range of motion. LUNGS:  With diffuse decreased breath sounds.  Resonant to percussion throughout. CARDIAC:  With regular rhythm.  No murmur or gallop. ABDOMEN:  Distended, tympanitic.  Increased bowel sounds.  No obvious mass or organomegaly. EXTREMITIES:  With a partial amputation of the right foot. NEUROLOGIC:  Mental status intact.  Cranial nerves grossly normal. Motor strength 5/5 upper extremities and right lower extremity.  Motor strength of the left lower extremity somewhat limited by pain, but he does have 4/5 strength with the exception that he cannot do a straight leg maneuver on the left, but he can easily do this on the right. RECTAL:  Sphincter tone is present, but decreased.  Stool is brown. Guaiac testing is negative.  IMPRESSION:  Findings are highly suspicious for metastatic cancer, unclear primary with early spinal cord compromise at L4.  RECOMMENDATIONS:  I would give him Decadron 8 mg IV stat and then 4 mg IV q.4 h.  I would pursue an MRI of the thoracic and lumbar spine to rule out cord compression.  Given his smoking history, I would get a CT scan of the chest to look for a primary tumor despite the negative chest x-ray.  We will need to establish a tissue diagnosis.  If the chest CT is negative, I would then ask Interventional Radiology to assist with a biopsy, one of the spine lesions.  I would ask Radiation Oncology to see the patient early and be ready to treat as soon as tissue was obtained if MRI confirmed suspicion of cord compromise.  Thank you for this consultation.  I will  follow closely with you.     Levert Feinstein, M.D., F.A.C.P.     JMG/MEDQ  D:  08/24/2011  T:  08/25/2011  Job:  [id number]  cc:   Kela Millin, M.D.

## 2011-08-26 DIAGNOSIS — R29898 Other symptoms and signs involving the musculoskeletal system: Secondary | ICD-10-CM

## 2011-08-26 DIAGNOSIS — D492 Neoplasm of unspecified behavior of bone, soft tissue, and skin: Secondary | ICD-10-CM

## 2011-08-26 DIAGNOSIS — M549 Dorsalgia, unspecified: Secondary | ICD-10-CM

## 2011-08-26 LAB — BASIC METABOLIC PANEL
CO2: 27 mEq/L (ref 19–32)
Calcium: 9.2 mg/dL (ref 8.4–10.5)
Creatinine, Ser: 0.79 mg/dL (ref 0.50–1.35)
GFR calc Af Amer: >90 mL/min (ref >90)
GFR calc non Af Amer: >90 mL/min (ref >90)
Sodium: 132 mEq/L — ABNORMAL LOW (ref 135–145)

## 2011-08-26 LAB — PSA: PSA: 1.84 ng/mL (ref <=4.00)

## 2011-08-26 LAB — KAPPA/LAMBDA LIGHT CHAINS
Kappa, lambda light chain ratio: 0.58 (ref 0.26–1.65)
Lambda free light chains: 1.17 mg/dL (ref 0.57–2.63)

## 2011-08-26 LAB — GLUCOSE, CAPILLARY
Glucose-Capillary: 203 mg/dL — ABNORMAL HIGH (ref 70–99)
Glucose-Capillary: 301 mg/dL — ABNORMAL HIGH (ref 70–99)
Glucose-Capillary: 234 mg/dL — ABNORMAL HIGH (ref 70–99)

## 2011-08-26 MED ORDER — INSULIN GLARGINE 100 UNIT/ML ~~LOC~~ SOLN
40.0000 [IU] | Freq: Two times a day (BID) | SUBCUTANEOUS | Status: DC
  Administered 2011-08-26 – 2011-08-27 (×2): 40 [IU] via SUBCUTANEOUS

## 2011-08-26 MED ORDER — INSULIN ASPART 100 UNIT/ML ~~LOC~~ SOLN
0.0000 [IU] | Freq: Three times a day (TID) | SUBCUTANEOUS | Status: DC
  Administered 2011-08-27: 3 [IU] via SUBCUTANEOUS

## 2011-08-26 MED ORDER — DOCUSATE SODIUM 100 MG PO CAPS
100.0000 mg | ORAL_CAPSULE | Freq: Two times a day (BID) | ORAL | Status: DC
  Administered 2011-08-26 – 2011-08-27 (×2): 100 mg via ORAL
  Filled 2011-08-26 (×3): qty 1

## 2011-08-26 MED ORDER — INSULIN ASPART 100 UNIT/ML ~~LOC~~ SOLN: 4.0000 [IU] | Freq: Three times a day (TID) | SUBCUTANEOUS | Status: DC

## 2011-08-26 NOTE — Evaluation (Signed)
Occupational Therapy Evaluation Patient Details Name: Bradley [name] MRN: [id number] DOB: 10/16/1945 Today's Date: 08/26/2011 Time: 4098-1191 OT Time Calculation (min): 19 min  OT Assessment / Plan / Recommendation Clinical Impression  Pt is a 65 yo male who presents with BLE weakness, lives alone and appears agitated when asked how he receives assistance in the home. Pt also appears agitated about acutally being in the hospital but was agreeable by end of session for [facility] therapy and aide. Skilled OT indicated to maximize independence with BADLs to supervision/min A level in prep for safe d/c home.    OT Assessment  Patient needs continued OT Services    Follow Up Recommendations  Home health OT;Supervision/Assistance - 24 hour    Barriers to Discharge Decreased caregiver support    Equipment Recommendations  3 in 1 bedside comode    Recommendations for Other Services    Frequency  Min 2X/week    Precautions / Restrictions Precautions Precautions: Fall   Pertinent Vitals/Pain     ADL  Grooming: Simulated;Set up Where Assessed - Grooming: Unsupported sitting Upper Body Bathing: Simulated;Set up Where Assessed - Upper Body Bathing: Unsupported sitting Lower Body Bathing: Simulated;Maximal assistance Where Assessed - Lower Body Bathing: Supine, head of bed flat;Rolling right and/or left Upper Body Dressing: Simulated;Set up Where Assessed - Upper Body Dressing: Unsupported sitting Lower Body Dressing: Simulated;Maximal assistance Where Assessed - Lower Body Dressing: Unsupported sitting;Supine, head of bed flat;Rolling right and/or left Toilet Transfer: Simulated;Minimal assistance Toilet Transfer Method: Other (comment) (Pt removes an armrest from w/c and is able to boost self.) Toileting - Clothing Manipulation and Hygiene: Simulated;Moderate assistance Where Assessed - Toileting Clothing Manipulation and Hygiene: Lean right and/or left Equipment Used: Wheelchair ADL  Comments: Unsure how pt is able to safely and successfully complete LB ADLs at home alone. Pt mobile around home via w/c. Pt appears discheveled.    OT Diagnosis: Generalized weakness  OT Problem List: Decreased activity tolerance;Decreased safety awareness;Decreased strength OT Treatment Interventions: Self-care/ADL training;Therapeutic activities;DME and/or AE instruction;Patient/family education   OT Goals Acute Rehab OT Goals OT Goal Formulation: With patient Potential to Achieve Goals: Fair ADL Goals Pt Will Perform Lower Body Bathing: Supine, head of bed flat;Supine, rolling right and/or left;Sitting, edge of bed;with supervision ADL Goal: Lower Body Bathing - Progress: Goal set today Pt Will Perform Lower Body Dressing: with supervision;Supine, head of bed up;Supine, head of bed flat;Supine, rolling right and/or left;Sitting, bed ADL Goal: Lower Body Dressing - Progress: Goal set today Pt Will Transfer to Toilet: with supervision;Comfort height toilet;Grab bars;Squat pivot transfer ADL Goal: Toilet Transfer - Progress: Goal set today Pt Will Perform Toileting - Clothing Manipulation: with supervision;Sitting on 3-in-1 or toilet ADL Goal: Toileting - Clothing Manipulation - Progress: Goal set today Pt Will Perform Toileting - Hygiene: Sitting on 3-in-1 or toilet ADL Goal: Toileting - Hygiene - Progress: Goal set today  Visit Information  Last OT Received On: 08/26/11 Assistance Needed: +1    Subjective Data  Subjective: If I have cancer I just want to get home. I don't like strangers in my house. Patient Stated Goal: Not asked   Prior Functioning  Vision/Perception  Home Living Lives With: Alone Available Help at Discharge: Friend(s) Type of Home: Apartment Home Access: Level entry Home Layout: One level Bathroom Shower/Tub: Health visitor: Standard Bathroom Accessibility: Yes How Accessible: Accessible via wheelchair Home Adaptive Equipment: Other  (comment);Grab bars around toilet;Shower chair with back;Grab bars in shower (hoveround) Prior Function Level of Independence:  Needs assistance Needs Assistance: Light Housekeeping Light Housekeeping: Total Able to Take Stairs?: No Driving: No Vocation: On disability Comments: Pt became very hostile and agitated when asked about who cleaned the house. Pt stated he "did not want to talk about it because that company made up lies about him." Communication Communication: No difficulties Dominant Hand: Right      Cognition  Overall Cognitive Status: Appears within functional limits for tasks assessed/performed Arousal/Alertness: Awake/alert Orientation Level: Appears intact for tasks assessed Behavior During Session: Agitated Cognition - Other Comments: Only when asked certain questions regarding home environment.    Extremity/Trunk Assessment Right Upper Extremity Assessment RUE ROM/Strength/Tone: [facility] for tasks assessed Left Upper Extremity Assessment LUE ROM/Strength/Tone: [facility] for tasks assessed .   Mobility Bed Mobility Bed Mobility: Sit to Supine Supine to Sit: 4: Min assist Sit to Supine: 4: Min assist;With rail;HOB flat Details for Bed Mobility Assistance: VC for safety as Pt. is impulsive Transfers Details for Transfer Assistance: wc armrest and leg rest removed on R so pt could squat pivot to bed.   Exercise    Balance Balance Balance Assessed: Yes Static Sitting Balance Static Sitting - Balance Support: No upper extremity supported;Feet unsupported Static Sitting - Level of Assistance: 5: Stand by assistance  End of Session OT - End of Session Activity Tolerance: Patient tolerated treatment well Patient left: in bed;with call bell/phone within reach  GO     [facility] A OTR/L [phone] 08/26/2011, 1:35 PM

## 2011-08-26 NOTE — Progress Notes (Signed)
Inpatient Diabetes Program Recommendations  AACE/ADA: New Consensus Statement on Inpatient Glycemic Control (2009)  Target Ranges:  Prepandial:   less than 140 mg/dL      Peak postprandial:   less than 180 mg/dL (1-2 hours)      Critically ill patients:  140 - 180 mg/dL   Reason for Visit: Hyperglycemia 66 year old white male with past medical history significant for chronic low back pain(for 4-5 years and since she fell while drinking alcohol), diabetes mellitus, hypertension who states he has not seen him his primary care physician in about 2 years and a present as above complaints. He states that for the past couple of months he developed and leg weakness, uses a wheelchair for the most part-and that today as he was trying to go to the bathroom he fell, stating his legs just give out on him. He denies fevers dysuria or cough, shortness of breath diarrhea melena and no hematochezia.  On Lantus 74 units and Amaryl 4 mg bid at home.  Results for GRANVEL, PROUDFOOT (MRN [id number]) as of 08/26/2011 15:19  Ref. Range 08/25/2011 12:05 08/25/2011 16:56 08/25/2011 21:26 08/26/2011 07:51 08/26/2011 11:33  Glucose-Capillary Latest Range: 70-99 mg/dL 409 (H) 811 (H) 914 (H) 203 (H) 301 (H)    To start Lantus tonight.  Eating 100%  Recommendations:  Will probably need meal coverage insulin while on Decadron.  Add Novolog 4 units tidwc.  Will follow.

## 2011-08-26 NOTE — Evaluation (Signed)
Physical Therapy Evaluation Patient Details Name: Bradley [name] MRN: [id number] DOB: [birth year] Today's Date: 08/26/2011 Time:  -     PT Assessment / Plan / Recommendation Clinical Impression  Pt was admitted after fall at home with progressive L LE weakness. Per pt. he has not mbulated in many years, uses a scooter for mobility. Pt. was agitated about staying in Hospital. Pt lives alone. Pt. does not appear to want to have assistance at home. Pt. will benefit from PT to improve safety and functional mobility to DC to home .    PT Assessment  Patient needs continued PT services    Follow Up Recommendations  Home health PT    Barriers to Discharge Decreased caregiver support      Equipment Recommendations  None recommended by PT    Recommendations for Other Services OT consult   Frequency Min 3X/week    Precautions / Restrictions Precautions Precautions: Fall   Pertinent Vitals/Pain       Mobility  Bed Mobility Bed Mobility: Supine to Sit;Sitting - Scoot to Edge of Bed Supine to Sit: 4: Min assist Details for Bed Mobility Assistance: VC for safety as Pt. is impulsive Transfers Transfers: [employer] manager Transfers: 3: Mod assist Details for Transfer Assistance: wc armrest and leg rest removed on R so pt could squat pivot to WC.     Exercises     PT Diagnosis: Other (comment) (partial paralysis of LE's)  PT Problem List: Decreased strength;Decreased range of motion;Decreased activity tolerance;Decreased mobility;Decreased knowledge of precautions;Decreased safety awareness;Decreased knowledge of use of DME;Impaired sensation PT Treatment Interventions: DME instruction;Functional mobility training;Therapeutic activities   PT Goals Acute Rehab PT Goals PT Goal Formulation: With patient Time For Goal Achievement: 09/09/11 Potential to Achieve Goals: Good Pt will go Supine/Side to Sit: with modified independence PT Goal: Supine/Side to Sit - Progress:  Goal set today Pt will go Sit to Supine/Side: with modified independence PT Goal: Sit to Supine/Side - Progress: Goal set today Pt will Transfer Bed to Chair/Chair to Bed: with modified independence PT Transfer Goal: Bed to Chair/Chair to Bed - Progress: Goal set today Pt will Propel Wheelchair: > 150 feet;with modified independence PT Goal: Propel Wheelchair - Progress: Goal set today  Visit Information  Last PT Received On: 08/26/11 Assistance Needed: +1    Subjective Data  Subjective: i have a right to go home. If I have cancer, I want to go home to die Patient Stated Goal: to go home   Prior Functioning  Home Living Lives With: Alone Available Help at Discharge: Friend(s) Type of Home: Apartment Home Access: Level entry Home Layout: One level Bathroom Shower/Tub: Health visitor: Standard Bathroom Accessibility: Yes How Accessible: Accessible via wheelchair Home Adaptive Equipment: Other (comment);Grab bars around toilet;Shower chair with back;Grab bars in shower (hoveround) Prior Function Level of Independence: Needs assistance Needs Assistance: Light Housekeeping Light Housekeeping: Total Able to Take Stairs?: No Driving: No Vocation: On disability Comments: Pt became very hostile and agitated when asked about who cleaned the house. Pt stated he "did not want to talk about it because that company made up lies about him." Communication Communication: No difficulties Dominant Hand: Right    Cognition  Overall Cognitive Status: Appears within functional limits for tasks assessed/performed Arousal/Alertness: Awake/alert Orientation Level: Appears intact for tasks assessed Behavior During Session: Anxious    Extremity/Trunk Assessment Right Lower Extremity Assessment RLE ROM/Strength/Tone: Deficits RLE ROM/Strength/Tone Deficits: grossly 3+/5 throughout, pt did really participate in testing  for either LE.transmet. amputation RLE Sensation:  Deficits RLE Sensation Deficits: pt. states LE is numb Left Lower Extremity Assessment LLE ROM/Strength/Tone: Deficits LLE ROM/Strength/Tone Deficits: difficult to assess due to participation-- trace dorsiflexion, knee ext 2/5. pt had to lift his leg w/ UE's to place onto footrest. LLE Sensation: Deficits LLE Sensation Deficits: states foot to hip is numb.   Balance Balance Balance Assessed: Yes Static Sitting Balance Static Sitting - Balance Support: No upper extremity supported;Feet unsupported Static Sitting - Level of Assistance: 5: Stand by assistance  End of Session PT - End of Session Activity Tolerance: Patient tolerated treatment well Patient left: in chair;with call bell/phone within reach Nurse Communication: Mobility status;Precautions  GP     Rada Hay 08/26/2011, 1:24 PM  [phone]

## 2011-08-26 NOTE — Progress Notes (Signed)
[facility] PROGRESS NOTE  Bradley [name] XBJ:[id number] DOB: September 30, 1945 DOA: 08/24/2011 PCP: Florentina Jenny, MD   Brief narrative: 66 year old white male with past medical history significant for chronic low back pain(for 4-5 years and since he fell while drinking alcohol- denies any alochol in the past 6-81mos ), diabetes mellitus, hypertension who presented s/p fall with worsening of back pain and leg weakness.CT of the lumbar spine done in ED showed lytic and lytic/sclerotic lesions throughout the region of the skeleton imaged. Findings consistent with metastatic disease, less likely myeloma. 25% loss of height at L1 and L4 and minimal loss of height at L5. Extraosseous tumor within the ventral spinal canal and the intravertebral foramina on the left at L4. Oncology was consulted and patient started on Decadron, rad onc was also consulted.  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  bilateral  foraminal stenosis, left greater than right associated with extraosseous extension of tumor. -CT chest without any lesion to suggest a lung primary.  -PSA pending. Discussed with Dr Cyndie Chime. Has discussed with IR who agree on getting bx of L4 lesion in am. -continue Decadron ,  pain management . Initially planned for prostate bx if PSA high but patient unlikely to agree staying in the hospital that long. -appreciate radiation onc assistance, to plan palliative radiation as outpatient -PT/OT consult   Active Problems:  DM (diabetes mellitus)  -poor control even on  increased Lantus and Amaryl, . Further worsened with decadron. Will change lantus to 40 u bid 9 was on 74 u daily). Add premeal coverage with aspart 4 u tid. Continue SSI   LBP (low back pain)  -as outlined above  Fall    -appreciate PT recs. Home with PT on d/c  Tobacco abuse  -Counseled on quitting smoking  Elevated Alk phos  -likely 2/2 to metastatic disease   Code Status: full Family Communication: none  Disposition Plan: home health PT on discharge. Patient does not want to stay after biopsy done tomorrow and threatens to sign out AMA if kept longer. Refuses for chemo but agrees with radiation. Which will be arranged as outpt. He says he does not want to die  In the hospital.      Consultants:  Dr. Cyndie Chime     Antibiotics:  none  HPI/Subjective: Patient still has back pain. Denies abdominal pain but hasnot had BM for 2 days.   Objective: Filed Vitals:   08/25/11 1415 08/25/11 2015 08/26/11 0521 08/26/11 1416  BP: 151/92 152/73 115/64 143/77  Pulse: 95 94 80 78  Temp: 98.5 F (36.9 C) 98.5 F (36.9 C) 98.3 F (36.8 C) 98.8 F (37.1 C)  TempSrc: Oral Oral Oral Oral  Resp: 20 19 18 16   Height:      Weight:      SpO2: 96% 93% 92% 93%    Intake/Output Summary (Last 24 hours) at 08/26/11 1557 Last data filed at 08/26/11 1416  Gross per 24 hour  Intake    240 ml  Output   1175 ml  Net   -935 ml    Exam:   General: frail , poorly groomed male in NAD  Heent: no pallor, MMM, poor dentition  Cardiovascular: normal S1&S2, No MRG  Respiratory: clear b/l, no added sounds  Abdomen: soft NT, ND , BS+  Ext: warm,  no edema, tender to palpation over back  CNS: AAO X 3, 4+/5 power in b/l LE  Data Reviewed: Basic Metabolic Panel:  Lab 08/26/11 5409 08/25/11 0805 08/24/11 0940  NA 132* 133* 136  K 4.2 4.3 4.3  CL 96 97 100  CO2 27 25 28   GLUCOSE 207* 166* 128*  BUN 26* 25* 19  CREATININE 0.79 0.77 0.88  CALCIUM 9.2 9.7 9.4  MG -- -- --  PHOS -- -- --   Liver Function Tests:  Lab 08/25/11 0805  AST 13  ALT 20  ALKPHOS 490*  BILITOT 0.2*  PROT 7.3  ALBUMIN 3.6   No results found for this basename: LIPASE:5,AMYLASE:5 in the last 168 hours No results  found for this basename: AMMONIA:5 in the last 168 hours CBC:  Lab 08/24/11 0940  WBC 10.9*  NEUTROABS 7.6  HGB 12.6*  HCT 37.5*  MCV 85.8  PLT 244   Cardiac Enzymes: No results found for this basename: CKTOTAL:5,CKMB:5,CKMBINDEX:5,TROPONINI:5 in the last 168 hours BNP (last 3 results) No results found for this basename: PROBNP:3 in the last 8760 hours CBG:  Lab 08/26/11 1133 08/26/11 0751 08/25/11 2126 08/25/11 1656 08/25/11 1205  GLUCAP 301* 203* 263* 197* 162*    No results found for this or any previous visit (from the past 240 hour(s)).   Studies: Dg Chest 2 View  08/24/2011  *RADIOLOGY REPORT*  Clinical Data: Fall, shortness of breath  CHEST - 2 VIEW  Comparison: 11/29/2008  Findings: Lungs are clear. No pleural effusion or pneumothorax.  Mild cardiomegaly.  Degenerative changes of the visualized thoracolumbar spine.  IMPRESSION: No evidence of acute cardiopulmonary disease.  Original Report Authenticated By: Charline Bills, M.D.   Dg Lumbar Spine Complete  08/24/2011  *RADIOLOGY REPORT*  Clinical Data: Fall, low back pain.  LUMBAR SPINE - COMPLETE 4+ VIEW  Comparison: 07/05/2011  Findings: There is mild vertebral body height loss at T12, L1, and L4.  Underlying sclerotic changes at L1 and L4. No retropulsion. Multilevel degenerative changes.  No dislocation.  Advanced atherosclerotic calcification.  IMPRESSION: Mild vertebral body height loss at T12, L1, and L4.  Underlying sclerotic changes of L1 and L4, therefore pathologic fracture/metastatic disease not excluded.  Recommend MRI follow-up.  Original Report Authenticated By: Waneta Martins, M.D.   Ct Chest W Contrast  08/25/2011  *RADIOLOGY REPORT*  Clinical Data: Evaluate for lung mass.  Metastatic disease with unknown primary.  CT CHEST WITH CONTRAST  Technique:  Multidetector CT imaging of the chest was performed following the standard protocol during bolus administration of intravenous contrast.  Contrast: 80mL  OMNIPAQUE IOHEXOL 300 MG/ML  SOLN  Comparison: Thoracic spine MRI 08/24/2011 and MRI lumbar spine 08/25/2011.  Chest radiograph 08/24/2011  Findings: Normal heart size.  Atherosclerotic calcification of the coronary arteries, and of the normal caliber thoracic aorta.  Trace right pleural effusion.  Negative for left pleural effusion or pericardial effusion.  Borderline prominent 9 mm prevascular lymph node on image number 24.  No additional borderline or enlarged mediastinal, hilar, or axillary lymph nodes.  Negative for supraclavicular lymphadenopathy.  Soft tissues of the breasts demonstrate mild bilateral gynecomastia.  No evidence of breast mass.  Esophagus is mildly distended with areas distal aspect, but otherwise unremarkable.  Normal appearance of the visualized portion of the thyroid gland.  Mild respiratory motion is slightly limits the evaluation of the lung parenchyma.  The trachea and mainstem bronchi are patent. Negative for emphysema.  There is no pulmonary mass or nodule.  Negative for airspace disease.  There are multiple irregular bony lesions, with a mixed lytic and sclerotic pattern consistent with diffuse bony metastatic disease. These lesions include the ribs bilaterally, the visualized thoracic and visualized upper lumbar vertebral bodies.  The largest focal lesions involve T5, where there is focal destruction of the posterior cortex and soft tissue extension into the epidural space in the midline and to the left of midline, and L1 where there is diffuse sclerosis and lucency throughout the vertebral body, and an associated compression fracture deformity.  The most prominent areas of bony metastatic disease involving the ribs include the right third rib anteriorly where there is expansion of the cortex, and the left posterolateral fifth rib where there is a central lucent lesion, with associated pathologic fracture through the inferior cortex. Small sclerotic focus in the body of the sternum,  for which a bony metastasis cannot be excluded (image #57 of the sagittal reformats).  Imaging of the upper abdomen demonstrates mild bilateral adrenal gland hyperplasia, left greater than right, without a focal adrenal gland mass.  The imaged portion of the liver enhances normally shows no evidence of mass.  The spleen is normal.  The imaged portion of the pancreas is normal.  The stomach has normal appearances.  No upper abdominal lymphadenopathy is identified.  No skin lesions are seen.  IMPRESSION:  1.  Negative for pulmonary nodule or mass.  A primary lung malignancy to explain the patient's diffuse bony metastatic disease is not identified on this chest CT, and there is no evidence of pulmonary metastatic disease. 2.  Single upper normal/mildly prominent prevascular lymph node (9 mm). 3.  Diffuse osseous metastatic disease involving the visualized vertebral bodies and multiple bilateral ribs, and possible small sternal metastasis.  There is destruction of the posterior cortex of T5 with extension of soft tissue density into the epidural space, suspicious for focal epidural spread of tumor.  There is extensive involvement of the L1 vertebral body with vertebral body compression fracture, as described on prior MRI. 4.  Pathologic fracture through a metastatic lesion in the left fifth rib.  Original Report Authenticated By: Britta Mccreedy, M.D.   Ct Lumbar Spine Wo Contrast  08/24/2011  *RADIOLOGY REPORT*  Clinical Data: Larey Seat.  Back pain.  CT LUMBAR SPINE WITHOUT CONTRAST  Technique:  Multidetector CT imaging of the lumbar spine was performed without intravenous contrast administration. Multiplanar CT image reconstructions were also generated.  Comparison: Radiography same day, 07/05/2011, 07/20/2008.  Findings: Lower portion of T12 is intact.  There is lytic destructive change of the left twelfth rib.  L1: Mixed lytic/sclerotic destructive change with loss of height of 25%.  No discernible intraspinal tumor.   L2:  Early lytic destruction of the posterior aspect of the vertebral body.  No loss of height.  No definable extraosseous tumor.  L3:  Early lytic destructive areas within the vertebral body.  No discernible extraosseous tumor or loss of height.  L4:  A mixed lytic/sclerotic destructive change with loss of height of 25%.  The tumor bulging into the ventral aspect of the spinal canal and involving the intervertebral foramen on the left.  L5:  Mixed lytic/sclerotic destructive change with loss of height of about 10%.  No apparent extraosseous tumor.  Sacrum and iliac bones:  Probable areas of mixed lytic and sclerotic tumor involvement.  No sign of extraosseous tumor.  IMPRESSION: Lytic and lytic/sclerotic lesions throughout the region of the skeleton imaged.  Findings consistent with metastatic disease or less  likely myeloma.  25% loss of height at L1 and L4 and minimal loss of height that L5.  Extraosseous tumor within the ventral spinal canal and the intervertebral foramen on the left at L4.  Critical Value/emergent results were called by telephone at the time of interpretation on 08/24/2011 at  0900 hours to physician's assistant Laveda Norman, who verbally acknowledged these results.  Original Report Authenticated By: Thomasenia Sales, M.D.   Mr Thoracic Spine Wo Contrast  08/24/2011  *RADIOLOGY REPORT*  Clinical Data: Emergency department patient with acute back pain. Evaluate for cord compression.  History of diabetes and spinal cord tumor.  Lumbar CT earlier today demonstrated probable osseous metastatic disease.  MRI THORACIC SPINE WITHOUT CONTRAST  Technique:  Multiplanar and multiecho pulse sequences of the thoracic spine were obtained without intravenous contrast.  Comparison: Lumbar spine CT 08/24/2011.  Findings: The patient was not able to complete the examination due to claustrophobia and pain.  Only sagittal imaging was obtained.  There is widespread osseous metastatic disease to the cervical thoracic spine.   Involvement of the thoracic region is greatest at T1, T5 and T12.  There is involvement of the posterior elements at multiple levels.  However, no pathologic fracture or significant epidural tumor is apparent.  There is no foraminal compromise or definite cord deformity in the thoracic region. Cervical assessment is limited.  No abnormal cord signal is seen.  There are no obvious paraspinal abnormalities as imaged in the sagittal plane.  IMPRESSION:  1.  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 2.  No pathologic fracture, cord deformity or significant foraminal compromise identified in the thoracic spine.  Cervical assessment is limited.  The patient was not able to complete the examination.  No sagittal or postcontrast imaging was obtained. Consider CT of the chest, abdomen and pelvis for further evaluation of the osseous metastatic disease and assessment for a possible primary lesion.  Original Report Authenticated By: Gerrianne Scale, M.D.   Mr Lumbar Spine Wo Contrast  08/25/2011  *RADIOLOGY REPORT*  Clinical Data: Eval for cord compromise from tumor.  MRI thoracic spine performed yesterday.  Bony metastatic disease.  MRI LUMBAR SPINE WITHOUT CONTRAST  Technique:  Multiplanar and multiecho pulse sequences of the lumbar spine were obtained without intravenous contrast.  Comparison: CT 08/24/2011.  Findings: The patient was able with complete sagittal T1 and T2- weighted imaging however axial imaging could not be performed due to intolerance of scanning.  The lumbar spine shows diffuse metastatic disease.  The metastatic infiltration is most pronounced at L1 and L4.  Every level is involved.  There is no definite cauda equina compression identified.  Bilateral foraminal stenosis is present at L4-L5 associated with extraosseous extension of tumor into both neural foramina, potentially affecting both L4 nerve roots, left greater than right.  L1 compression fracture appears similar  to CT 08/24/2011.  IMPRESSION: 1.  The patient was only able to complete sagittal imaging T1 and T2 sequences.  There is no gross cauda equina compression. 2.  Diffuse metastatic disease, most prominent at L4-L5.  Although the central canal appears grossly patent, there is bilateral foraminal stenosis, left greater than right associated with extraosseous extension of tumor. 3.  L1 and L4 pathologic compression fractures appears similar to prior.  Original Report Authenticated By: Andreas Newport, M.D.    Scheduled Meds:   . aspirin  81 mg Oral Daily  . atorvastatin  20 mg Oral QPC supper  . dexamethasone  4 mg Intravenous  Q6H  . enoxaparin (LOVENOX) injection  40 mg Subcutaneous Q24H  . glimepiride  4 mg Oral BID AC  . insulin aspart  0-9 Units Subcutaneous TID WC  . insulin glargine  74 Units Subcutaneous QHS  . senna  1 tablet Oral BID  . sodium chloride  3 mL Intravenous Q12H  . sodium chloride  3 mL Intravenous Q12H  . traZODone  50 mg Oral QHS  . vitamin B-12  1,000 mcg Oral Daily  . DISCONTD: insulin glargine  70 Units Subcutaneous QHS   Continuous Infusions:      Time spent: 30 minutes    Keniel Ralston  [facility] Pager [phone]. If 8PM-8AM, please contact night-coverage at [email or web address], password [facility] 08/26/2011, 3:57 PM  LOS: 2 days

## 2011-08-26 NOTE — Progress Notes (Signed)
Radiation Oncology         [phone]) [phone] ________________________________  Name: Bradley [name] MRN: [id number]  Date: 08/26/2011  DOB: May 24, 1945    Diagnosis:   Metastatic cancer clinically with biopsy pending  Narrative:  The patient is scheduled for a biopsy tomorrow of the L4 lesion. Awaiting pathology to make final arrangements for radiation treatment. Patient continues on Decadron. Radiation treatment can be performed as an outpatient after we obtain tissue confirmation. It appears that the patient does insist on going home after his biopsy.                              ALLERGIES:   has no known allergies.  Meds: Current Facility-Administered Medications  Medication Dose Route Frequency Provider Last Rate Last Dose  . 0.9 %  sodium chloride infusion  250 mL Intravenous PRN Kela Millin, MD      . acetaminophen (TYLENOL) tablet 650 mg  650 mg Oral Q6H PRN Adeline C Viyuoh, MD       Or  . acetaminophen (TYLENOL) suppository 650 mg  650 mg Rectal Q6H PRN Adeline C Viyuoh, MD      . albuterol (PROVENTIL) (5 MG/ML) 0.5% nebulizer solution 2.5 mg  2.5 mg Nebulization Q4H PRN Adeline C Viyuoh, MD      . aspirin chewable tablet 81 mg  81 mg Oral Daily Adeline C Viyuoh, MD   81 mg at 08/26/11 1048  . atorvastatin (LIPITOR) tablet 20 mg  20 mg Oral QPC supper Kela Millin, MD   20 mg at 08/26/11 1754  . bisacodyl (DULCOLAX) EC tablet 5 mg  5 mg Oral Daily PRN Adeline C Viyuoh, MD      . dexamethasone (DECADRON) injection 4 mg  4 mg Intravenous Q6H Adeline C Viyuoh, MD   4 mg at 08/26/11 1755  . docusate sodium (COLACE) capsule 100 mg  100 mg Oral BID Nishant Dhungel, MD   100 mg at 08/26/11 2205  . enoxaparin (LOVENOX) injection 40 mg  40 mg Subcutaneous Q24H Adeline C Viyuoh, MD   40 mg at 08/26/11 1755  . glimepiride (AMARYL) tablet 4 mg  4 mg Oral BID AC Adeline C Viyuoh, MD   4 mg at 08/26/11 1754  . HYDROcodone-acetaminophen (NORCO/VICODIN) 5-325 MG per tablet 1-2 tablet  1-2  tablet Oral Q4H PRN Kela Millin, MD   2 tablet at 08/26/11 2205  . insulin aspart (novoLOG) injection 0-20 Units  0-20 Units Subcutaneous TID WC Nishant Dhungel, MD      . insulin aspart (novoLOG) injection 4 Units  4 Units Subcutaneous TID WC Nishant Dhungel, MD      . insulin glargine (LANTUS) injection 40 Units  40 Units Subcutaneous BID Nishant Dhungel, MD   40 Units at 08/26/11 2204  . ipratropium (ATROVENT) nebulizer solution 0.5 mg  0.5 mg Nebulization Q4H PRN Adeline C Viyuoh, MD      . morphine 2 MG/ML injection 1-2 mg  1-2 mg Intravenous Q4H PRN Kela Millin, MD   1 mg at 08/24/11 1906  . ondansetron (ZOFRAN) tablet 4 mg  4 mg Oral Q6H PRN Adeline C Viyuoh, MD       Or  . ondansetron (ZOFRAN) injection 4 mg  4 mg Intravenous Q6H PRN Adeline C Viyuoh, MD      . senna (SENOKOT) tablet 8.6 mg  1 tablet Oral BID Kela Millin, MD   8.6  mg at 08/26/11 2205  . sodium chloride 0.9 % injection 3 mL  3 mL Intravenous Q12H Adeline C Viyuoh, MD   3 mL at 08/26/11 1049  . sodium chloride 0.9 % injection 3 mL  3 mL Intravenous PRN Adeline C Viyuoh, MD      . sodium chloride 0.9 % injection 3 mL  3 mL Intravenous Q12H Adeline C Viyuoh, MD      . traMADol (ULTRAM) tablet 50 mg  50 mg Oral Q6H PRN Adeline C Viyuoh, MD      . traZODone (DESYREL) tablet 50 mg  50 mg Oral QHS Kela Millin, MD   50 mg at 08/26/11 2205  . vitamin B-12 (CYANOCOBALAMIN) tablet 1,000 mcg  1,000 mcg Oral Daily Kela Millin, MD   1,000 mcg at 08/26/11 1048  . DISCONTD: insulin aspart (novoLOG) injection 0-9 Units  0-9 Units Subcutaneous TID WC Kela Millin, MD   7 Units at 08/26/11 1308  . DISCONTD: insulin glargine (LANTUS) injection 70 Units  70 Units Subcutaneous QHS Kela Millin, MD   70 Units at 08/25/11 2136  . DISCONTD: insulin glargine (LANTUS) injection 74 Units  74 Units Subcutaneous QHS Kela Millin, MD        Physical Findings: The patient is in no acute distress. Patient is alert  and oriented.  height is 5\' 8"  (1.727 m) and weight is 199 lb (90.266 kg). His oral temperature is 98.5 F (36.9 C). His blood pressure is 138/71 and his pulse is 93. His respiration is 16 and oxygen saturation is 91%. .     Lab Findings: Lab Results  Component Value Date   WBC 10.9* 08/24/2011   HGB 12.6* 08/24/2011   HCT 37.5* 08/24/2011   MCV 85.8 08/24/2011   PLT 244 08/24/2011     Radiographic Findings: Dg Chest 2 View  08/24/2011  *RADIOLOGY REPORT*  Clinical Data: Fall, shortness of breath  CHEST - 2 VIEW  Comparison: 11/29/2008  Findings: Lungs are clear. No pleural effusion or pneumothorax.  Mild cardiomegaly.  Degenerative changes of the visualized thoracolumbar spine.  IMPRESSION: No evidence of acute cardiopulmonary disease.  Original Report Authenticated By: Charline Bills, M.D.   Dg Lumbar Spine Complete  08/24/2011  *RADIOLOGY REPORT*  Clinical Data: Fall, low back pain.  LUMBAR SPINE - COMPLETE 4+ VIEW  Comparison: 07/05/2011  Findings: There is mild vertebral body height loss at T12, L1, and L4.  Underlying sclerotic changes at L1 and L4. No retropulsion. Multilevel degenerative changes.  No dislocation.  Advanced atherosclerotic calcification.  IMPRESSION: Mild vertebral body height loss at T12, L1, and L4.  Underlying sclerotic changes of L1 and L4, therefore pathologic fracture/metastatic disease not excluded.  Recommend MRI follow-up.  Original Report Authenticated By: Waneta Martins, M.D.   Ct Chest W Contrast  08/25/2011  *RADIOLOGY REPORT*  Clinical Data: Evaluate for lung mass.  Metastatic disease with unknown primary.  CT CHEST WITH CONTRAST  Technique:  Multidetector CT imaging of the chest was performed following the standard protocol during bolus administration of intravenous contrast.  Contrast: 80mL OMNIPAQUE IOHEXOL 300 MG/ML  SOLN  Comparison: Thoracic spine MRI 08/24/2011 and MRI lumbar spine 08/25/2011.  Chest radiograph 08/24/2011  Findings: Normal heart  size.  Atherosclerotic calcification of the coronary arteries, and of the normal caliber thoracic aorta.  Trace right pleural effusion.  Negative for left pleural effusion or pericardial effusion.  Borderline prominent 9 mm prevascular lymph node on image number 24.  No additional borderline or enlarged mediastinal, hilar, or axillary lymph nodes.  Negative for supraclavicular lymphadenopathy.  Soft tissues of the breasts demonstrate mild bilateral gynecomastia.  No evidence of breast mass.  Esophagus is mildly distended with areas distal aspect, but otherwise unremarkable.  Normal appearance of the visualized portion of the thyroid gland.  Mild respiratory motion is slightly limits the evaluation of the lung parenchyma.  The trachea and mainstem bronchi are patent. Negative for emphysema.  There is no pulmonary mass or nodule. Negative for airspace disease.  There are multiple irregular bony lesions, with a mixed lytic and sclerotic pattern consistent with diffuse bony metastatic disease. These lesions include the ribs bilaterally, the visualized thoracic and visualized upper lumbar vertebral bodies.  The largest focal lesions involve T5, where there is focal destruction of the posterior cortex and soft tissue extension into the epidural space in the midline and to the left of midline, and L1 where there is diffuse sclerosis and lucency throughout the vertebral body, and an associated compression fracture deformity.  The most prominent areas of bony metastatic disease involving the ribs include the right third rib anteriorly where there is expansion of the cortex, and the left posterolateral fifth rib where there is a central lucent lesion, with associated pathologic fracture through the inferior cortex. Small sclerotic focus in the body of the sternum, for which a bony metastasis cannot be excluded (image #57 of the sagittal reformats).  Imaging of the upper abdomen demonstrates mild bilateral adrenal gland  hyperplasia, left greater than right, without a focal adrenal gland mass.  The imaged portion of the liver enhances normally shows no evidence of mass.  The spleen is normal.  The imaged portion of the pancreas is normal.  The stomach has normal appearances.  No upper abdominal lymphadenopathy is identified.  No skin lesions are seen.  IMPRESSION:  1.  Negative for pulmonary nodule or mass.  A primary lung malignancy to explain the patient's diffuse bony metastatic disease is not identified on this chest CT, and there is no evidence of pulmonary metastatic disease. 2.  Single upper normal/mildly prominent prevascular lymph node (9 mm). 3.  Diffuse osseous metastatic disease involving the visualized vertebral bodies and multiple bilateral ribs, and possible small sternal metastasis.  There is destruction of the posterior cortex of T5 with extension of soft tissue density into the epidural space, suspicious for focal epidural spread of tumor.  There is extensive involvement of the L1 vertebral body with vertebral body compression fracture, as described on prior MRI. 4.  Pathologic fracture through a metastatic lesion in the left fifth rib.  Original Report Authenticated By: Britta Mccreedy, M.D.   Ct Lumbar Spine Wo Contrast  08/24/2011  *RADIOLOGY REPORT*  Clinical Data: Larey Seat.  Back pain.  CT LUMBAR SPINE WITHOUT CONTRAST  Technique:  Multidetector CT imaging of the lumbar spine was performed without intravenous contrast administration. Multiplanar CT image reconstructions were also generated.  Comparison: Radiography same day, 07/05/2011, 07/20/2008.  Findings: Lower portion of T12 is intact.  There is lytic destructive change of the left twelfth rib.  L1: Mixed lytic/sclerotic destructive change with loss of height of 25%.  No discernible intraspinal tumor.  L2:  Early lytic destruction of the posterior aspect of the vertebral body.  No loss of height.  No definable extraosseous tumor.  L3:  Early lytic destructive  areas within the vertebral body.  No discernible extraosseous tumor or loss of height.  L4:  A mixed lytic/sclerotic destructive change  with loss of height of 25%.  The tumor bulging into the ventral aspect of the spinal canal and involving the intervertebral foramen on the left.  L5:  Mixed lytic/sclerotic destructive change with loss of height of about 10%.  No apparent extraosseous tumor.  Sacrum and iliac bones:  Probable areas of mixed lytic and sclerotic tumor involvement.  No sign of extraosseous tumor.  IMPRESSION: Lytic and lytic/sclerotic lesions throughout the region of the skeleton imaged.  Findings consistent with metastatic disease or less likely myeloma.  25% loss of height at L1 and L4 and minimal loss of height that L5.  Extraosseous tumor within the ventral spinal canal and the intervertebral foramen on the left at L4.  Critical Value/emergent results were called by telephone at the time of interpretation on 08/24/2011 at  0900 hours to physician's assistant Laveda Norman, who verbally acknowledged these results.  Original Report Authenticated By: Thomasenia Sales, M.D.   Mr Thoracic Spine Wo Contrast  08/24/2011  *RADIOLOGY REPORT*  Clinical Data: Emergency department patient with acute back pain. Evaluate for cord compression.  History of diabetes and spinal cord tumor.  Lumbar CT earlier today demonstrated probable osseous metastatic disease.  MRI THORACIC SPINE WITHOUT CONTRAST  Technique:  Multiplanar and multiecho pulse sequences of the thoracic spine were obtained without intravenous contrast.  Comparison: Lumbar spine CT 08/24/2011.  Findings: The patient was not able to complete the examination due to claustrophobia and pain.  Only sagittal imaging was obtained.  There is widespread osseous metastatic disease to the cervical thoracic spine.  Involvement of the thoracic region is greatest at T1, T5 and T12.  There is involvement of the posterior elements at multiple levels.  However, no pathologic  fracture or significant epidural tumor is apparent.  There is no foraminal compromise or definite cord deformity in the thoracic region. Cervical assessment is limited.  No abnormal cord signal is seen.  There are no obvious paraspinal abnormalities as imaged in the sagittal plane.  IMPRESSION:  1.  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 2.  No pathologic fracture, cord deformity or significant foraminal compromise identified in the thoracic spine.  Cervical assessment is limited.  The patient was not able to complete the examination.  No sagittal or postcontrast imaging was obtained. Consider CT of the chest, abdomen and pelvis for further evaluation of the osseous metastatic disease and assessment for a possible primary lesion.  Original Report Authenticated By: Gerrianne Scale, M.D.   Mr Lumbar Spine Wo Contrast  08/25/2011  *RADIOLOGY REPORT*  Clinical Data: Eval for cord compromise from tumor.  MRI thoracic spine performed yesterday.  Bony metastatic disease.  MRI LUMBAR SPINE WITHOUT CONTRAST  Technique:  Multiplanar and multiecho pulse sequences of the lumbar spine were obtained without intravenous contrast.  Comparison: CT 08/24/2011.  Findings: The patient was able with complete sagittal T1 and T2- weighted imaging however axial imaging could not be performed due to intolerance of scanning.  The lumbar spine shows diffuse metastatic disease.  The metastatic infiltration is most pronounced at L1 and L4.  Every level is involved.  There is no definite cauda equina compression identified.  Bilateral foraminal stenosis is present at L4-L5 associated with extraosseous extension of tumor into both neural foramina, potentially affecting both L4 nerve roots, left greater than right.  L1 compression fracture appears similar to CT 08/24/2011.  IMPRESSION: 1.  The patient was only able to complete sagittal imaging T1 and T2 sequences.  There is no  gross cauda equina compression. 2.   Diffuse metastatic disease, most prominent at L4-L5.  Although the central canal appears grossly patent, there is bilateral foraminal stenosis, left greater than right associated with extraosseous extension of tumor. 3.  L1 and L4 pathologic compression fractures appears similar to prior.  Original Report Authenticated By: Andreas Newport, M.D.     Plan:  Will followup on biopsy results and scheduled the patient for a simulation such that we can plan palliative radiation treatment.   Please let me know if I can be of any further assistance [phone]) from an inpatient standpoint.   Radene Gunning, M.D., Ph.D.

## 2011-08-26 NOTE — Progress Notes (Addendum)
Lumbar MRI correlates well with CT findings: there is tumor in spinal canal at left, L4, likely impinging on nerve roots but no gross cord compression at this time. I would still continue decadron. CT chest shows no obvious primary tumor. I am checking a PSA and a myeloma panel although low likelihood that this is myeloma with lytic lesions and significant increase in alkaline phosphatase. I did a rectal exam on admission and I did not feel any nodules in the prostate. I will review MRI w IR to see optimal place to biopsy for tissue dx.  Addendum: MRI reviewed w IR:  Core biopsy could be done at L4. If PSA returns with significant elevation, I would ask Urology to biopsy prostate.  If not, then schedule L4 biopsy w IR

## 2011-08-27 ENCOUNTER — Inpatient Hospital Stay (HOSPITAL_COMMUNITY): Payer: Medicaid Other

## 2011-08-27 DIAGNOSIS — C7952 Secondary malignant neoplasm of bone marrow: Principal | ICD-10-CM

## 2011-08-27 DIAGNOSIS — C7951 Secondary malignant neoplasm of bone: Secondary | ICD-10-CM

## 2011-08-27 DIAGNOSIS — C801 Malignant (primary) neoplasm, unspecified: Secondary | ICD-10-CM

## 2011-08-27 HISTORY — DX: Secondary malignant neoplasm of bone: C79.51

## 2011-08-27 LAB — GLUCOSE, CAPILLARY: Glucose-Capillary: 119 mg/dL — ABNORMAL HIGH (ref 70–99)

## 2011-08-27 MED ORDER — DEXAMETHASONE 4 MG PO TABS: 4.0000 mg | ORAL_TABLET | Freq: Two times a day (BID) | ORAL | Status: AC

## 2011-08-27 MED ORDER — DSS 100 MG PO CAPS: 100.0000 mg | ORAL_CAPSULE | Freq: Two times a day (BID) | ORAL | Status: AC

## 2011-08-27 MED ORDER — HYDROCODONE-ACETAMINOPHEN 5-325 MG PO TABS: 2.0000 | ORAL_TABLET | ORAL | Status: AC | PRN

## 2011-08-27 MED ORDER — INSULIN GLARGINE 100 UNIT/ML ~~LOC~~ SOLN: 40.0000 [IU] | Freq: Two times a day (BID) | SUBCUTANEOUS | Status: DC

## 2011-08-27 MED ORDER — OMEPRAZOLE 20 MG PO CPDR: 20.0000 mg | DELAYED_RELEASE_CAPSULE | Freq: Every day | ORAL | Status: AC

## 2011-08-27 NOTE — Progress Notes (Signed)
Came to speak to patient in regards to needle biopsy.  Patient will not even begin to listen to any details regarding procedure - states his wishes are to leave AMA. He is adamant he will not proceed with this procedure. Dr. Patsy Lager RN made aware to  let Dr.  Cyndie Chime know patient refusing and leaving hospital.

## 2011-08-27 NOTE — Progress Notes (Signed)
PSA normal Plan to proceed with needle biopsy of L4 spine by IR this AM Patient is obstinate - he insists on leaving the hospital. He will consider radiation treatment but not chemotherapy. Compliance will be a major issue. Hospice referral appropriate. Major Care Management needs.

## 2011-08-27 NOTE — Discharge Summary (Signed)
Physician Discharge Summary  Bradley [name]:[id number] DOB: 1945/09/16 DOA: 08/24/2011  PCP: Florentina Jenny, MD  Admit date: 08/24/2011 Discharge date: 08/27/2011  Recommendations for Outpatient Follow-up:  1. Home health PT, home health SW to set up for home hospice 2. 2. 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   Active problems:  DM (diabetes mellitus)  Fall  Leg weakness, bilateral  LBP (low back pain)  Tobacco abuse   Discharge Condition: poor  Diet recommendation: diabetic  Wt Readings from Last 3 Encounters:  08/24/11 90.266 kg (199 lb)    History of present illness:  66 year old white male with past medical history significant for chronic low back pain(for 4-5 years and since he fell while drinking alcohol- denies any alochol in the past 6-43mos ), diabetes mellitus, hypertension who presented s/p fall with worsening of back pain and leg weakness.CT of the lumbar spine done in ED showed lytic and lytic/sclerotic lesions throughout the region of the skeleton imaged. Findings consistent with metastatic disease, less likely myeloma. 25% loss of height at L1 and L4 and minimal loss of height at L5. Extraosseous tumor within the ventral spinal canal and the intravertebral foramina on the left at L4. Oncology was consulted and patient started on Decadron, rad onc was also consulted.   Hospital Course:  Leg weakness with early spinal cord compromise at L4-likely secondary to metastatic cancer unclear primary  CT of the lumbar spine done in ED showed lytic and lytic/sclerotic lesions throughout the region of the skeleton imaged. Findings consistent with metastatic disease. -MRI of lumbar spine shows Diffuse metastatic disease, most prominent at L4-L5. bilateral  foraminal stenosis, left greater than right associated with extraosseous extension of tumor.  -CT chest without any lesion to  suggest a lung primary.  -patient refused to undergo any form of chemo but initially did agree on palliative radiation. Radiation oncology was consulted and recommended getting tissue diagnosis before initiating therapy.  -PSA was normal. Discussed with Dr Cyndie Chime.  We had planned for IR guided needle biopsy of the lesion today which patient had initially agreed on and wished to leave after that, but this morning he changed his mind and refused to undergo biopsy. He says ist is not a good experience and he does not want to undergo any treatment and end up the rest of his life in the hospital . He wished to sign out AMA.  Patient was at length discussed about the significant risk to his health including worsening of his back pain, worsening to total paralysis of his limbs , further spread of tumor and risk for death on signing out AMA.  He has full capacity and wishes to be at home. i have asked him to follow up with Dr Cyndie Chime as outpatient but i am certain he will not follow up. He used to see Dr Redmond School as outpatient but not since past 2 years.  His diabetes is not well  controlled and while on decadron this is going to be further worse. i am sending him on decadron taper and few days on narcotics. i have discussed with Dr Cyndie Chime and we both agree patient has very high chances of returning to hospital with grave complications of the disease.   PT recommended for home PT which i will order on discharge. i will also request for home SW to evaluate and to set up with home hospice.  Active  Problems:  DM (diabetes mellitus)  -poorly controlled. i have changed his lantus dose to BID. continue amaryl. compliance is definitely an issue and and with decadron his diabetes will likely be further worsened.   Fall  -appreciate PT recs. Home with PT on d/c   Tobacco abuse  -Counseled on quitting smoking     Patient clinically not ready for discharge hoer but wished to sign out  AMA   Procedures:  none  Consultations:  Dr. Cyndie Chime ( oncology)  radiation oncology  Discharge Exam: Filed Vitals:   08/27/11 0554  BP: 129/75  Pulse: 87  Temp: 98.2 F (36.8 C)  Resp: 16   Filed Vitals:   08/26/11 0521 08/26/11 1416 08/26/11 2208 08/27/11 0554  BP: 115/64 143/77 138/71 129/75  Pulse: 80 78 93 87  Temp: 98.3 F (36.8 C) 98.8 F (37.1 C) 98.5 F (36.9 C) 98.2 F (36.8 C)  TempSrc: Oral Oral Oral Oral  Resp: 18 16 16 16   Height:      Weight:      SpO2: 92% 93% 91% 95%   General: frail , poorly groomed male in NAD  Heent: no pallor, MMM, poor dentition  Cardiovascular: normal S1&S2, No MRG  Respiratory: clear b/l, no added sounds  Abdomen: soft NT, ND , BS+  Ext: warm, no edema, tender to palpation over back  CNS: AAO X 3, 4+/5 power in b/l LE    Discharge Instructions   Medication List  As of 08/27/2011 12:55 PM   TAKE these medications         albuterol 108 (90 BASE) MCG/ACT inhaler   Commonly known as: PROVENTIL HFA;VENTOLIN HFA   Inhale 2 puffs into the lungs every 6 (six) hours as needed.      aspirin 81 MG tablet   Take 81 mg by mouth daily.      atorvastatin 20 MG tablet   Commonly known as: LIPITOR   Take 20 mg by mouth daily.      dexamethasone 4 MG tablet   Commonly known as: DECADRON   Take 1 tablet (4 mg total) by mouth 2 (two) times daily with a meal.      DSS 100 MG Caps   Take 100 mg by mouth 2 (two) times daily.      glimepiride 4 MG tablet   Commonly known as: AMARYL   Take 4 mg by mouth 2 (two) times daily.      HYDROcodone-acetaminophen 5-325 MG per tablet   Commonly known as: NORCO/VICODIN   Take 2 tablets by mouth every 4 (four) hours as needed.      insulin glargine 100 UNIT/ML injection   Commonly known as: LANTUS   Inject 40 Units into the skin 2 (two) times daily.      omeprazole 20 MG capsule   Commonly known as: PRILOSEC   Take 1 capsule (20 mg total) by mouth daily.      traMADol 50 MG  tablet   Commonly known as: ULTRAM   Take 50 mg by mouth every 6 (six) hours as needed.      traZODone 50 MG tablet   Commonly known as: DESYREL   Take 50 mg by mouth at bedtime.      vitamin B-12 1000 MCG tablet   Commonly known as: CYANOCOBALAMIN   Take 1,000 mcg by mouth daily.           Follow-up Information    Follow up with Levert Feinstein, MD in 1  week.   Contact information:   [address] [place] [address] [phone]           The results of significant diagnostics from this hospitalization (including imaging, microbiology, ancillary and laboratory) are listed below for reference.    Significant Diagnostic Studies: Dg Chest 2 View  08/24/2011  *RADIOLOGY REPORT*  Clinical Data: Fall, shortness of breath  CHEST - 2 VIEW  Comparison: 11/29/2008  Findings: Lungs are clear. No pleural effusion or pneumothorax.  Mild cardiomegaly.  Degenerative changes of the visualized thoracolumbar spine.  IMPRESSION: No evidence of acute cardiopulmonary disease.  Original Report Authenticated By: Charline Bills, M.D.   Dg Lumbar Spine Complete  08/24/2011  *RADIOLOGY REPORT*  Clinical Data: Fall, low back pain.  LUMBAR SPINE - COMPLETE 4+ VIEW  Comparison: 07/05/2011  Findings: There is mild vertebral body height loss at T12, L1, and L4.  Underlying sclerotic changes at L1 and L4. No retropulsion. Multilevel degenerative changes.  No dislocation.  Advanced atherosclerotic calcification.  IMPRESSION: Mild vertebral body height loss at T12, L1, and L4.  Underlying sclerotic changes of L1 and L4, therefore pathologic fracture/metastatic disease not excluded.  Recommend MRI follow-up.  Original Report Authenticated By: Waneta Martins, M.D.   Ct Chest W Contrast  08/25/2011  *RADIOLOGY REPORT*  Clinical Data: Evaluate for lung mass.  Metastatic disease with unknown primary.  CT CHEST WITH CONTRAST  Technique:  Multidetector CT imaging of the chest was performed  following the standard protocol during bolus administration of intravenous contrast.  Contrast: 80mL OMNIPAQUE IOHEXOL 300 MG/ML  SOLN  Comparison: Thoracic spine MRI 08/24/2011 and MRI lumbar spine 08/25/2011.  Chest radiograph 08/24/2011  Findings: Normal heart size.  Atherosclerotic calcification of the coronary arteries, and of the normal caliber thoracic aorta.  Trace right pleural effusion.  Negative for left pleural effusion or pericardial effusion.  Borderline prominent 9 mm prevascular lymph node on image number 24.  No additional borderline or enlarged mediastinal, hilar, or axillary lymph nodes.  Negative for supraclavicular lymphadenopathy.  Soft tissues of the breasts demonstrate mild bilateral gynecomastia.  No evidence of breast mass.  Esophagus is mildly distended with areas distal aspect, but otherwise unremarkable.  Normal appearance of the visualized portion of the thyroid gland.  Mild respiratory motion is slightly limits the evaluation of the lung parenchyma.  The trachea and mainstem bronchi are patent. Negative for emphysema.  There is no pulmonary mass or nodule. Negative for airspace disease.  There are multiple irregular bony lesions, with a mixed lytic and sclerotic pattern consistent with diffuse bony metastatic disease. These lesions include the ribs bilaterally, the visualized thoracic and visualized upper lumbar vertebral bodies.  The largest focal lesions involve T5, where there is focal destruction of the posterior cortex and soft tissue extension into the epidural space in the midline and to the left of midline, and L1 where there is diffuse sclerosis and lucency throughout the vertebral body, and an associated compression fracture deformity.  The most prominent areas of bony metastatic disease involving the ribs include the right third rib anteriorly where there is expansion of the cortex, and the left posterolateral fifth rib where there is a central lucent lesion, with associated  pathologic fracture through the inferior cortex. Small sclerotic focus in the body of the sternum, for which a bony metastasis cannot be excluded (image #57 of the sagittal reformats).  Imaging of the upper abdomen demonstrates mild bilateral adrenal gland hyperplasia, left greater than right, without a  focal adrenal gland mass.  The imaged portion of the liver enhances normally shows no evidence of mass.  The spleen is normal.  The imaged portion of the pancreas is normal.  The stomach has normal appearances.  No upper abdominal lymphadenopathy is identified.  No skin lesions are seen.  IMPRESSION:  1.  Negative for pulmonary nodule or mass.  A primary lung malignancy to explain the patient's diffuse bony metastatic disease is not identified on this chest CT, and there is no evidence of pulmonary metastatic disease. 2.  Single upper normal/mildly prominent prevascular lymph node (9 mm). 3.  Diffuse osseous metastatic disease involving the visualized vertebral bodies and multiple bilateral ribs, and possible small sternal metastasis.  There is destruction of the posterior cortex of T5 with extension of soft tissue density into the epidural space, suspicious for focal epidural spread of tumor.  There is extensive involvement of the L1 vertebral body with vertebral body compression fracture, as described on prior MRI. 4.  Pathologic fracture through a metastatic lesion in the left fifth rib.  Original Report Authenticated By: Britta Mccreedy, M.D.   Ct Lumbar Spine Wo Contrast  08/24/2011  *RADIOLOGY REPORT*  Clinical Data: Larey Seat.  Back pain.  CT LUMBAR SPINE WITHOUT CONTRAST  Technique:  Multidetector CT imaging of the lumbar spine was performed without intravenous contrast administration. Multiplanar CT image reconstructions were also generated.  Comparison: Radiography same day, 07/05/2011, 07/20/2008.  Findings: Lower portion of T12 is intact.  There is lytic destructive change of the left twelfth rib.  L1: Mixed  lytic/sclerotic destructive change with loss of height of 25%.  No discernible intraspinal tumor.  L2:  Early lytic destruction of the posterior aspect of the vertebral body.  No loss of height.  No definable extraosseous tumor.  L3:  Early lytic destructive areas within the vertebral body.  No discernible extraosseous tumor or loss of height.  L4:  A mixed lytic/sclerotic destructive change with loss of height of 25%.  The tumor bulging into the ventral aspect of the spinal canal and involving the intervertebral foramen on the left.  L5:  Mixed lytic/sclerotic destructive change with loss of height of about 10%.  No apparent extraosseous tumor.  Sacrum and iliac bones:  Probable areas of mixed lytic and sclerotic tumor involvement.  No sign of extraosseous tumor.  IMPRESSION: Lytic and lytic/sclerotic lesions throughout the region of the skeleton imaged.  Findings consistent with metastatic disease or less likely myeloma.  25% loss of height at L1 and L4 and minimal loss of height that L5.  Extraosseous tumor within the ventral spinal canal and the intervertebral foramen on the left at L4.  Critical Value/emergent results were called by telephone at the time of interpretation on 08/24/2011 at  0900 hours to physician's assistant Laveda Norman, who verbally acknowledged these results.  Original Report Authenticated By: Thomasenia Sales, M.D.   Mr Thoracic Spine Wo Contrast  08/24/2011  *RADIOLOGY REPORT*  Clinical Data: Emergency department patient with acute back pain. Evaluate for cord compression.  History of diabetes and spinal cord tumor.  Lumbar CT earlier today demonstrated probable osseous metastatic disease.  MRI THORACIC SPINE WITHOUT CONTRAST  Technique:  Multiplanar and multiecho pulse sequences of the thoracic spine were obtained without intravenous contrast.  Comparison: Lumbar spine CT 08/24/2011.  Findings: The patient was not able to complete the examination due to claustrophobia and pain.  Only sagittal  imaging was obtained.  There is widespread osseous metastatic disease to the cervical thoracic  spine.  Involvement of the thoracic region is greatest at T1, T5 and T12.  There is involvement of the posterior elements at multiple levels.  However, no pathologic fracture or significant epidural tumor is apparent.  There is no foraminal compromise or definite cord deformity in the thoracic region. Cervical assessment is limited.  No abnormal cord signal is seen.  There are no obvious paraspinal abnormalities as imaged in the sagittal plane.  IMPRESSION:  1.  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 2.  No pathologic fracture, cord deformity or significant foraminal compromise identified in the thoracic spine.  Cervical assessment is limited.  The patient was not able to complete the examination.  No sagittal or postcontrast imaging was obtained. Consider CT of the chest, abdomen and pelvis for further evaluation of the osseous metastatic disease and assessment for a possible primary lesion.  Original Report Authenticated By: Gerrianne Scale, M.D.   Mr Lumbar Spine Wo Contrast  08/25/2011  *RADIOLOGY REPORT*  Clinical Data: Eval for cord compromise from tumor.  MRI thoracic spine performed yesterday.  Bony metastatic disease.  MRI LUMBAR SPINE WITHOUT CONTRAST  Technique:  Multiplanar and multiecho pulse sequences of the lumbar spine were obtained without intravenous contrast.  Comparison: CT 08/24/2011.  Findings: The patient was able with complete sagittal T1 and T2- weighted imaging however axial imaging could not be performed due to intolerance of scanning.  The lumbar spine shows diffuse metastatic disease.  The metastatic infiltration is most pronounced at L1 and L4.  Every level is involved.  There is no definite cauda equina compression identified.  Bilateral foraminal stenosis is present at L4-L5 associated with extraosseous extension of tumor into both neural foramina,  potentially affecting both L4 nerve roots, left greater than right.  L1 compression fracture appears similar to CT 08/24/2011.  IMPRESSION: 1.  The patient was only able to complete sagittal imaging T1 and T2 sequences.  There is no gross cauda equina compression. 2.  Diffuse metastatic disease, most prominent at L4-L5.  Although the central canal appears grossly patent, there is bilateral foraminal stenosis, left greater than right associated with extraosseous extension of tumor. 3.  L1 and L4 pathologic compression fractures appears similar to prior.  Original Report Authenticated By: Andreas Newport, M.D.    Microbiology: No results found for this or any previous visit (from the past 240 hour(s)).   Labs: Basic Metabolic Panel:  Lab 08/26/11 1191 08/25/11 0805 08/24/11 0940  NA 132* 133* 136  K 4.2 4.3 4.3  CL 96 97 100  CO2 27 25 28   GLUCOSE 207* 166* 128*  BUN 26* 25* 19  CREATININE 0.79 0.77 0.88  CALCIUM 9.2 9.7 9.4  MG -- -- --  PHOS -- -- --   Liver Function Tests:  Lab 08/25/11 0805  AST 13  ALT 20  ALKPHOS 490*  BILITOT 0.2*  PROT 7.37.1  ALBUMIN 3.6   No results found for this basename: LIPASE:5,AMYLASE:5 in the last 168 hours No results found for this basename: AMMONIA:5 in the last 168 hours CBC:  Lab 08/24/11 0940  WBC 10.9*  NEUTROABS 7.6  HGB 12.6*  HCT 37.5*  MCV 85.8  PLT 244   Cardiac Enzymes: No results found for this basename: CKTOTAL:5,CKMB:5,CKMBINDEX:5,TROPONINI:5 in the last 168 hours BNP: BNP (last 3 results) No results found for this basename: PROBNP:3 in the last 8760 hours CBG:  Lab 08/27/11 1155 08/27/11 0809 08/26/11 2205 08/26/11 1713 08/26/11 1133  GLUCAP 119* 143* 323*  234* 301*    Time coordinating discharge: 45 minutes  Signed:  Mizuki Hoel  [facility] 08/27/2011, 12:55 PM

## 2011-08-27 NOTE — Progress Notes (Signed)
Occupational Therapy Note Chart reviewed. Note plan for biopsy this am. Will check back later as schedule allows. Judithann Sauger OTR/L [phone] 08/27/2011

## 2011-08-31 LAB — MULTIPLE MYELOMA PANEL, SERUM
Alpha-1-Globulin: 6.1 % — ABNORMAL HIGH (ref 2.9–4.9)
Beta 2: 5.5 % (ref 3.2–6.5)
Beta Globulin: 5.9 % (ref 4.7–7.2)
Gamma Globulin: 12.6 % (ref 11.1–18.8)

## 2011-09-10 ENCOUNTER — Telehealth: Payer: Self-pay | Admitting: *Deleted

## 2011-09-10 ENCOUNTER — Inpatient Hospital Stay (HOSPITAL_COMMUNITY)
Admission: EM | Admit: 2011-09-10 | Discharge: 2011-09-15 | DRG: 479 | Disposition: A | Discharge status: Hospice | Attending: Internal Medicine | Admitting: Internal Medicine

## 2011-09-10 ENCOUNTER — Encounter (HOSPITAL_COMMUNITY): Payer: Self-pay | Admitting: Vascular Surgery

## 2011-09-10 DIAGNOSIS — E1149 Type 2 diabetes mellitus with other diabetic neurological complication: Secondary | ICD-10-CM | POA: Diagnosis present

## 2011-09-10 DIAGNOSIS — E785 Hyperlipidemia, unspecified: Secondary | ICD-10-CM | POA: Diagnosis present

## 2011-09-10 DIAGNOSIS — R748 Abnormal levels of other serum enzymes: Secondary | ICD-10-CM | POA: Diagnosis present

## 2011-09-10 DIAGNOSIS — K219 Gastro-esophageal reflux disease without esophagitis: Secondary | ICD-10-CM | POA: Diagnosis present

## 2011-09-10 DIAGNOSIS — E119 Type 2 diabetes mellitus without complications: Secondary | ICD-10-CM | POA: Diagnosis present

## 2011-09-10 DIAGNOSIS — C801 Malignant (primary) neoplasm, unspecified: Secondary | ICD-10-CM | POA: Diagnosis present

## 2011-09-10 DIAGNOSIS — D72829 Elevated white blood cell count, unspecified: Secondary | ICD-10-CM | POA: Diagnosis present

## 2011-09-10 DIAGNOSIS — F17200 Nicotine dependence, unspecified, uncomplicated: Secondary | ICD-10-CM | POA: Diagnosis present

## 2011-09-10 DIAGNOSIS — I10 Essential (primary) hypertension: Secondary | ICD-10-CM | POA: Diagnosis present

## 2011-09-10 DIAGNOSIS — R29898 Other symptoms and signs involving the musculoskeletal system: Secondary | ICD-10-CM

## 2011-09-10 DIAGNOSIS — C7952 Secondary malignant neoplasm of bone marrow: Principal | ICD-10-CM | POA: Diagnosis present

## 2011-09-10 DIAGNOSIS — H04129 Dry eye syndrome of unspecified lacrimal gland: Secondary | ICD-10-CM | POA: Diagnosis present

## 2011-09-10 DIAGNOSIS — M549 Dorsalgia, unspecified: Secondary | ICD-10-CM | POA: Diagnosis present

## 2011-09-10 DIAGNOSIS — K3184 Gastroparesis: Secondary | ICD-10-CM | POA: Diagnosis present

## 2011-09-10 DIAGNOSIS — H04123 Dry eye syndrome of bilateral lacrimal glands: Secondary | ICD-10-CM | POA: Diagnosis present

## 2011-09-10 DIAGNOSIS — C7951 Secondary malignant neoplasm of bone: Principal | ICD-10-CM | POA: Diagnosis present

## 2011-09-10 HISTORY — DX: Malignant (primary) neoplasm, unspecified: C80.1

## 2011-09-10 HISTORY — DX: Gastro-esophageal reflux disease without esophagitis: K21.9

## 2011-09-10 LAB — CBC WITH DIFFERENTIAL/PLATELET
Basophils Absolute: 0 10*3/uL (ref 0.0–0.1)
Basophils Relative: 0 % (ref 0–1)
Eosinophils Absolute: 0.2 10*3/uL (ref 0.0–0.7)
Eosinophils Relative: 1 % (ref 0–5)
HCT: 34.3 % — ABNORMAL LOW (ref 39.0–52.0)
MCHC: 34.1 g/dL (ref 30.0–36.0)
MCV: 84.7 fL (ref 78.0–100.0)
Monocytes Absolute: 1.3 10*3/uL — ABNORMAL HIGH (ref 0.1–1.0)
RDW: 14 % (ref 11.5–15.5)

## 2011-09-10 LAB — COMPREHENSIVE METABOLIC PANEL
AST: 18 U/L (ref 0–37)
Albumin: 2.7 g/dL — ABNORMAL LOW (ref 3.5–5.2)
Calcium: 8.3 mg/dL — ABNORMAL LOW (ref 8.4–10.5)
Creatinine, Ser: 0.65 mg/dL (ref 0.50–1.35)

## 2011-09-10 LAB — MAGNESIUM: Magnesium: 2 mg/dL (ref 1.5–2.5)

## 2011-09-10 MED ORDER — ATORVASTATIN CALCIUM 20 MG PO TABS
20.0000 mg | ORAL_TABLET | Freq: Every day | ORAL | Status: DC
  Administered 2011-09-10 – 2011-09-15 (×6): 20 mg via ORAL
  Filled 2011-09-10 (×6): qty 1

## 2011-09-10 MED ORDER — HYDROMORPHONE HCL PF 1 MG/ML IJ SOLN: 1.0000 mg | INTRAMUSCULAR | Status: DC | PRN

## 2011-09-10 MED ORDER — SODIUM CHLORIDE 0.9 % IV SOLN
Freq: Once | INTRAVENOUS | Status: AC
  Administered 2011-09-10: 18:00:00 via INTRAVENOUS

## 2011-09-10 MED ORDER — ENOXAPARIN SODIUM 40 MG/0.4ML ~~LOC~~ SOLN
40.0000 mg | SUBCUTANEOUS | Status: DC
  Administered 2011-09-10 – 2011-09-14 (×3): 40 mg via SUBCUTANEOUS
  Filled 2011-09-10 (×6): qty 0.4

## 2011-09-10 MED ORDER — INSULIN GLARGINE 100 UNIT/ML ~~LOC~~ SOLN
40.0000 [IU] | Freq: Two times a day (BID) | SUBCUTANEOUS | Status: DC
  Administered 2011-09-10 – 2011-09-15 (×10): 40 [IU] via SUBCUTANEOUS

## 2011-09-10 MED ORDER — PANTOPRAZOLE SODIUM 40 MG PO TBEC
40.0000 mg | DELAYED_RELEASE_TABLET | Freq: Every day | ORAL | Status: DC
  Administered 2011-09-12 – 2011-09-15 (×4): 40 mg via ORAL
  Filled 2011-09-10 (×5): qty 1

## 2011-09-10 MED ORDER — DEXAMETHASONE 4 MG PO TABS
4.0000 mg | ORAL_TABLET | Freq: Four times a day (QID) | ORAL | Status: DC
  Administered 2011-09-10 – 2011-09-15 (×16): 4 mg via ORAL
  Filled 2011-09-10 (×24): qty 1

## 2011-09-10 MED ORDER — ONDANSETRON HCL 4 MG/2ML IJ SOLN
4.0000 mg | Freq: Once | INTRAMUSCULAR | Status: AC
  Administered 2011-09-10: 4 mg via INTRAVENOUS
  Filled 2011-09-10: qty 2

## 2011-09-10 MED ORDER — HYDROMORPHONE HCL PF 1 MG/ML IJ SOLN
1.0000 mg | Freq: Once | INTRAMUSCULAR | Status: AC
  Administered 2011-09-10: 1 mg via INTRAVENOUS
  Filled 2011-09-10: qty 1

## 2011-09-10 MED ORDER — ONDANSETRON HCL 4 MG/2ML IJ SOLN: 4.0000 mg | Freq: Four times a day (QID) | INTRAMUSCULAR | Status: DC | PRN

## 2011-09-10 MED ORDER — ALBUTEROL SULFATE HFA 108 (90 BASE) MCG/ACT IN AERS: 2.0000 | INHALATION_SPRAY | Freq: Four times a day (QID) | RESPIRATORY_TRACT | Status: DC | PRN

## 2011-09-10 MED ORDER — GLIMEPIRIDE 4 MG PO TABS
4.0000 mg | ORAL_TABLET | Freq: Two times a day (BID) | ORAL | Status: DC
  Administered 2011-09-11 – 2011-09-15 (×6): 4 mg via ORAL
  Filled 2011-09-10 (×11): qty 1

## 2011-09-10 MED ORDER — ONDANSETRON HCL 4 MG PO TABS: 4.0000 mg | ORAL_TABLET | Freq: Four times a day (QID) | ORAL | Status: DC | PRN

## 2011-09-10 MED ORDER — HYDROCODONE-ACETAMINOPHEN 5-325 MG PO TABS
1.0000 | ORAL_TABLET | ORAL | Status: DC | PRN
  Administered 2011-09-10 – 2011-09-11 (×2): 2 via ORAL
  Administered 2011-09-11: 1 via ORAL
  Administered 2011-09-12 – 2011-09-15 (×11): 2 via ORAL
  Filled 2011-09-10 (×4): qty 2
  Filled 2011-09-10: qty 1
  Filled 2011-09-10 (×9): qty 2

## 2011-09-10 MED ORDER — GUAIFENESIN-DM 100-10 MG/5ML PO SYRP
10.0000 mL | ORAL_SOLUTION | Freq: Four times a day (QID) | ORAL | Status: DC | PRN
  Administered 2011-09-10: 10 mL via ORAL
  Filled 2011-09-10: qty 10

## 2011-09-10 MED ORDER — ASPIRIN EC 81 MG PO TBEC
81.0000 mg | DELAYED_RELEASE_TABLET | Freq: Every day | ORAL | Status: DC
  Administered 2011-09-10 – 2011-09-15 (×6): 81 mg via ORAL
  Filled 2011-09-10 (×6): qty 1

## 2011-09-10 MED ORDER — TRAZODONE HCL 50 MG PO TABS
50.0000 mg | ORAL_TABLET | Freq: Every day | ORAL | Status: DC
  Administered 2011-09-10 – 2011-09-14 (×5): 50 mg via ORAL
  Filled 2011-09-10 (×6): qty 1

## 2011-09-10 MED ORDER — HYDROMORPHONE HCL PF 1 MG/ML IJ SOLN
1.0000 mg | INTRAMUSCULAR | Status: DC | PRN
  Administered 2011-09-11: 1 mg via INTRAVENOUS
  Filled 2011-09-10: qty 1

## 2011-09-10 MED ORDER — SODIUM CHLORIDE 0.9 % IJ SOLN: 3.0000 mL | INTRAMUSCULAR | Status: DC | PRN

## 2011-09-10 MED ORDER — DEXAMETHASONE 4 MG PO TABS
4.0000 mg | ORAL_TABLET | Freq: Two times a day (BID) | ORAL | Status: DC
  Filled 2011-09-10: qty 1

## 2011-09-10 MED ORDER — SODIUM CHLORIDE 0.9 % IJ SOLN
3.0000 mL | Freq: Two times a day (BID) | INTRAMUSCULAR | Status: DC
  Administered 2011-09-10 – 2011-09-15 (×5): 3 mL via INTRAVENOUS

## 2011-09-10 MED ORDER — SODIUM CHLORIDE 0.9 % IV SOLN: 250.0000 mL | INTRAVENOUS | Status: DC | PRN

## 2011-09-10 NOTE — ED Notes (Signed)
[id number]<AV> Expected date:09/10/11<BR> Expected time: 3:21 PM<BR> Means of arrival:<BR> Comments:<BR> 59 male low back pain/cancer

## 2011-09-10 NOTE — ED Provider Notes (Signed)
History     CSN: [id number]  Arrival date & time 09/10/11  1522   First MD Initiated Contact with Patient 09/10/11 1543      Chief Complaint  Patient presents with  . Back Pain    (Consider location/radiation/quality/duration/timing/severity/associated sxs/prior treatment) Patient is a 66 y.o. male presenting with back pain. The history is provided by the patient.  Back Pain    patient here with increased back pain. Was diagnosed with metastatic prostate cancer 2 weeks ago and signed out AMA before having his treatment completed. He now presents requesting admission and definitive treatment. States he is in a wheelchair. Denies any perineal numbness. Denies any urinary retention or bowel incontinence. According to records patient was to have palliative radiation to to severe metastatic disease including spinal stenosis. He was told that he could possibly be paralyzed due to this he did not seek treatment.  Past Medical History  Diagnosis Date  . Hypertension   . Diabetes mellitus   . Back pain     Past Surgical History  Procedure Date  . Total hip arthroplasty   . Toe resections     No family history on file.  History  Substance Use Topics  . Smoking status: Current Some Day Smoker -- 0.5 packs/day for 49 years  . Smokeless tobacco: Never Used  . Alcohol Use: No      Review of Systems  Musculoskeletal: Positive for back pain.  All other systems reviewed and are negative.    Allergies  Penicillins  Home Medications   Current Outpatient Rx  Name Route Sig Dispense Refill  . ALBUTEROL SULFATE HFA 108 (90 BASE) MCG/ACT IN AERS Inhalation Inhale 2 puffs into the lungs every 6 (six) hours as needed.    . ASPIRIN 81 MG PO TABS Oral Take 81 mg by mouth daily.    . ATORVASTATIN CALCIUM 20 MG PO TABS Oral Take 20 mg by mouth daily.    Marland Kitchen GLIMEPIRIDE 4 MG PO TABS Oral Take 4 mg by mouth 2 (two) times daily.    . INSULIN GLARGINE 100 UNIT/ML [place] SOLN Subcutaneous Inject  40 Units into the skin 2 (two) times daily. 10 mL 0  . OMEPRAZOLE 20 MG PO CPDR Oral Take 1 capsule (20 mg total) by mouth daily. 30 capsule 0  . TRAMADOL HCL 50 MG PO TABS Oral Take 50 mg by mouth every 6 (six) hours as needed.    . TRAZODONE HCL 50 MG PO TABS Oral Take 50 mg by mouth at bedtime.    Marland Kitchen VITAMIN B-12 1000 MCG PO TABS Oral Take 1,000 mcg by mouth daily.      BP 125/61  Pulse 96  Temp 99.4 F (37.4 C) (Oral)  Resp 22  SpO2 97%  Physical Exam  Nursing note and vitals reviewed. Constitutional: He is oriented to person, place, and time. He appears well-developed and well-nourished.  Non-toxic appearance. No distress.  HENT:  Head: Normocephalic and atraumatic.  Eyes: Conjunctivae, EOM and lids are normal. Pupils are equal, round, and reactive to light.  Neck: Normal range of motion. Neck supple. No tracheal deviation present. No mass present.  Cardiovascular: Normal rate, regular rhythm and normal heart sounds.  Exam reveals no gallop.   No murmur heard. Pulmonary/Chest: Effort normal and breath sounds normal. No stridor. No respiratory distress. He has no decreased breath sounds. He has no wheezes. He has no rhonchi. He has no rales.  Abdominal: Soft. Normal appearance and bowel sounds are normal.  He exhibits no distension. There is no tenderness. There is no rebound and no CVA tenderness.  Musculoskeletal: Normal range of motion. He exhibits no edema and no tenderness.  Neurological: He is alert and oriented to person, place, and time. He has normal strength. No cranial nerve deficit or sensory deficit. GCS eye subscore is 4. GCS verbal subscore is 5. GCS motor subscore is 6.       Patient with normal strength at the right lower extremity. Strength is 3/5 at left lower extremity  Skin: Skin is warm and dry. No abrasion and no rash noted.  Psychiatric: He has a normal mood and affect. His speech is normal and behavior is normal.    ED Course  Procedures (including critical  care time)   Labs Reviewed  CBC WITH DIFFERENTIAL  COMPREHENSIVE METABOLIC PANEL   No results found.   No diagnosis found.    MDM  Pt to be admitted for treatment of his back pain by [facility]        Toy Baker, MD 09/10/11 1845

## 2011-09-10 NOTE — Telephone Encounter (Signed)
Received call from [facility] nurse/[phone] stating that pt would like a call back from Dr. Patsy Lager nurse regarding some possible surgery.  The ph # left was [phone].  Attempted x 2 to call this # & received automated vm stating quicken ph may be off or person is out of range & to try again later.  Sue Lush reports that pt has signed up for hospice.

## 2011-09-10 NOTE — ED Notes (Signed)
Pt arrives to the ED via [facility] for eval of lower back pain. Pt has recent dx of prostate cancer with metastasis to the back. Pt reports he was supposed to have back surgery to remove the cancer but refused the surgery. He reports after speaking with family he wanted to have the surgery. Pts hospice nurse called and informed charge nurse that he is receiving hospice care and was not to have surgery but instead a biopsy and pallative radiation. [facility] him and the number is [phone].

## 2011-09-10 NOTE — H&P (Signed)
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 Pt reports leaving AMA few weeks ago but now has changed his mind and wants to consider radiation therapy as his weakness and pain is getting worse. He denies fevers, chills, no specific abdominal or urinary concerns, no urinary or fecal incontinence.   Assessment and Plan: Principal Problem:  *Back pain - likely secondary to metastatic prostate cancer to the bone - during the recent hospitalization pt has refused needle biopsy and has left AMA - he was discharged on decadron taper - will admit the pt to medical floor and will provide supportive care with analgesia as needed and decadron 4 mg Q6 hours as recommended by Dr. Clelia Croft, on-call oncologist - pt has had recent MRI of the lumbar spine done 07/30.2013 and it has shown diffuse metastatic disease to the spinal area, along with bilateral foraminal stenosis, L1/L4 compression fx - I will not order MRI tonight, as this was recently done and my management would not change regardless - now that pt is considering radiation therapy I think it is reasonable to give it a chance and will leave it to primary team to obtain consult and recommendation as I do not see immediate urgency to do this tonight - I have discussed this with Dr. Clelia Croft  Active Problems:  Metastatic cancer to bone - pt was supposed to follow up with Dr. Cyndie Chime - will ask primary team to decide if they need to be  re consulted as well   Hypertension - will monitor vitals per floor protocol - readjust the regimen as indicated   DM (diabetes mellitus) - uncontrolled and with complications, gastroparesis and neuropathy - continue home medication regimen and may need to readjust it sine I will start decadron  - will check A1C   Increase in Alkaline Phosphatase  - likely secondary to progressive bone metastasis - now up from the last value 2 weeks ago, 400's --> 600's   Leukocytosis - likely reactive but pt also has low grade fever - will monitor vitals per floor protocol - will obtain urinalysis and urine culture   GERD - continue Protonix   Code Status: Full Family Communication: Pt at bedside Disposition Plan: Admit to medical floor   Review of Systems:  Constitutional: Negative for fever, chills and malaise/fatigue. Negative for diaphoresis.  HENT: Negative for hearing loss, ear pain, nosebleeds, congestion, sore throat, neck pain, tinnitus and ear discharge.   Eyes: Negative for blurred vision, double vision, photophobia, pain, discharge and redness.  Respiratory: Negative for cough, hemoptysis, sputum production, shortness of breath, wheezing and stridor.   Cardiovascular: Negative for chest pain, palpitations, orthopnea, claudication and leg swelling.  Gastrointestinal: Negative for nausea, vomiting and abdominal pain. Negative for heartburn, constipation, blood in stool and melena.  Genitourinary: Negative for dysuria, urgency, frequency, hematuria and flank pain.  Musculoskeletal: Negative for myalgias, positive for  back pain, and falls.  Skin: Negative for itching and rash.  Neurological: Negative for dizziness, positive for generalized weakness, left lower  extremity > right LE weakness. Negative for tingling, tremors, sensory change, speech change, loss of consciousness and headaches.  Endo/Heme/Allergies: Negative for environmental allergies and polydipsia. Does not bruise/bleed  easily.  Psychiatric/Behavioral: Negative for suicidal ideas. The patient is not nervous/anxious.      Past Medical History  Diagnosis Date  . Hypertension   . Diabetes mellitus   . Back pain     Past Surgical History  Procedure Date  . Total hip arthroplasty   . Toe resections     Social History:  reports that he has been smoking.  He has never used smokeless tobacco. He reports that he does not drink alcohol or use illicit drugs.  Allergies  Allergen Reactions  . Penicillins     Pt reports he is allergic to all antibiotics. States that he "cannot take antibiotics because they make him go to the bathroom a lot and I won't take them."     No family history of cancer, no heart disease.  Medication Sig  albuterol (PROVENTIL HFA;VENTOLIN HFA) 108 (90 BASE) MCG/ACT inhaler Inhale 2 puffs into the lungs every 6 (six) hours as needed. For shortness of breath  aspirin 81 MG tablet Take 81 mg by mouth daily.  atorvastatin (LIPITOR) 20 MG tablet Take 20 mg by mouth daily.  glimepiride (AMARYL) 4 MG tablet Take 4 mg by mouth 2 (two) times daily.  insulin glargine (LANTUS SOLOSTAR) 100 UNIT/ML injection Inject 40 Units into the skin 2 (two) times daily.  omeprazole (PRILOSEC) 20 MG capsule Take 1 capsule (20 mg total) by mouth daily.  traMADol (ULTRAM) 50 MG tablet Take 50 mg by mouth every 6 (six) hours as needed. For pain  traZODone (DESYREL) 50 MG tablet Take 50 mg by mouth at bedtime.  vitamin B-12 (CYANOCOBALAMIN) 1000 MCG tablet Take 1,000 mcg by mouth daily.    Physical Exam: Filed Vitals:   09/10/11 1527 09/10/11 1536  BP:  125/61  Pulse:  96  Temp:  99.4 F (37.4 C)  TempSrc:  Oral  Resp:  22  SpO2: 97% 97%    Physical Exam  Constitutional: Appears to be in mild distress due to pain HENT: Normocephalic. External right and left ear normal. Dry MM Eyes: Conjunctivae and EOM are normal. PERRLA, no scleral icterus.  Neck: Normal ROM. Neck supple. No JVD. No tracheal  deviation. No thyromegaly.  CVS: RRR, S1/S2 +, no murmurs, no gallops, no carotid bruit.  Pulmonary: Effort and breath sounds normal, no stridor, rhonchi, wheezes, rales.  Abdominal: Soft. BS +,  no distension, tenderness, rebound or guarding.  Musculoskeletal: No edema and no tenderness.  Lymphadenopathy: No lymphadenopathy noted, cervical, inguinal. Neuro: Left lower extremity weakness > right lower extremity weakness pain with movement Skin: Skin is warm and dry. No rash noted. Not diaphoretic. No erythema. No pallor.  Psychiatric: Normal mood and affect. Behavior, judgment, thought content normal.   Labs on Admission:  Basic Metabolic Panel:  Lab 09/10/11 4098  NA 135  K 4.1  CL 99  CO2 26  GLUCOSE 201*  BUN 20  CREATININE 0.65  CALCIUM 8.3*  MG --  PHOS --   Liver Function Tests:  Lab 09/10/11 1743  AST 18  ALT 29  ALKPHOS 675*  BILITOT 0.2*  PROT 5.7*  ALBUMIN 2.7*   CBC:  Lab 09/10/11 1743  WBC 13.2*  NEUTROABS 9.5*  HGB 11.7*  HCT 34.3*  MCV 84.7  PLT 160   Radiological Exams on Admission: No results found.  EKG: Not obtained  Debbora Presto, MD  [facility] Pager [phone]  If 7PM-7AM, please contact night-coverage [email or web address] Password [facility] 09/10/2011, 6:46 PM

## 2011-09-10 NOTE — Telephone Encounter (Signed)
Received call from [facility] stating they have been seeing pt since last fri & they didn't know pt had [facility] nor did they know pt had hospice.  She reports that pt has had a change in heart & now wants radiation & he called 911 & is in the ED now.  Reported this to Dr Annett Gula call MD.

## 2011-09-11 ENCOUNTER — Telehealth: Payer: Self-pay | Admitting: *Deleted

## 2011-09-11 ENCOUNTER — Ambulatory Visit
Admit: 2011-09-11 | Discharge: 2011-09-11 | Disposition: A | Discharge status: Home / Self Care | Attending: Radiation Oncology | Admitting: Radiation Oncology

## 2011-09-11 DIAGNOSIS — R748 Abnormal levels of other serum enzymes: Secondary | ICD-10-CM

## 2011-09-11 LAB — HEMOGLOBIN A1C
Hgb A1c MFr Bld: 8.9 % — ABNORMAL HIGH (ref <5.7)
Mean Plasma Glucose: 209 mg/dL — ABNORMAL HIGH (ref <117)

## 2011-09-11 LAB — BASIC METABOLIC PANEL
Chloride: 100 mEq/L (ref 96–112)
GFR calc non Af Amer: >90 mL/min (ref >90)
Glucose, Bld: 54 mg/dL — ABNORMAL LOW (ref 70–99)
Potassium: 4.5 mEq/L (ref 3.5–5.1)
Sodium: 136 mEq/L (ref 135–145)

## 2011-09-11 LAB — GLUCOSE, CAPILLARY
Glucose-Capillary: 132 mg/dL — ABNORMAL HIGH (ref 70–99)
Glucose-Capillary: 398 mg/dL — ABNORMAL HIGH (ref 70–99)

## 2011-09-11 LAB — CBC
HCT: 36.4 % — ABNORMAL LOW (ref 39.0–52.0)
Hemoglobin: 12.2 g/dL — ABNORMAL LOW (ref 13.0–17.0)
MCH: 28.9 pg (ref 26.0–34.0)
RBC: 4.22 MIL/uL (ref 4.22–5.81)

## 2011-09-11 MED ORDER — INSULIN ASPART 100 UNIT/ML ~~LOC~~ SOLN
4.0000 [IU] | Freq: Three times a day (TID) | SUBCUTANEOUS | Status: DC
  Administered 2011-09-11 – 2011-09-15 (×10): 4 [IU] via SUBCUTANEOUS

## 2011-09-11 MED ORDER — INSULIN ASPART 100 UNIT/ML ~~LOC~~ SOLN: 0.0000 [IU] | Freq: Three times a day (TID) | SUBCUTANEOUS | Status: DC

## 2011-09-11 MED ORDER — INSULIN ASPART 100 UNIT/ML ~~LOC~~ SOLN
0.0000 [IU] | Freq: Three times a day (TID) | SUBCUTANEOUS | Status: DC
  Administered 2011-09-11: 15 [IU] via SUBCUTANEOUS
  Administered 2011-09-12: 11 [IU] via SUBCUTANEOUS
  Administered 2011-09-12: 8 [IU] via SUBCUTANEOUS
  Administered 2011-09-12: 2 [IU] via SUBCUTANEOUS
  Administered 2011-09-13 – 2011-09-15 (×4): 8 [IU] via SUBCUTANEOUS

## 2011-09-11 NOTE — Progress Notes (Signed)
[facility] PROGRESS NOTE  JOANNE BRANDER ZOX:[id number] DOB: 1945/08/01 DOA: 09/10/2011 PCP: Florentina Jenny, MD  Assessment/Plan: 1-Back pain  - likely secondary to metastatic disease; at this point unclear source.  - Patient has now agree to radiation therapy and also biopsy for pathology in order to identif/ diagnose disease. -Will continue decadron -continue pain meds and supportive therapy.  2-Metastatic cancer to bone  -will follow biopsy results and depending diagnosis and future patient decision of treatment involved oncology into the case. -radiation therapy and decadron as mentioned above for now.  3-Hypertension  - continue current regimen and readjust as indicated   4-DM (diabetes mellitus)  - uncontrolled and with complications, gastroparesis and neuropathy  - continue home dose of lantus and readjust as needed. - will check A1C and start SSI.  5-Increase in Alkaline Phosphatase  - likely secondary to progressive bone metastasis  - now up from the last value 2 weeks ago, 400's --> 600's   6-Leukocytosis  - likely reactive especially with recent steroids use. - will follow urine culture  -patient afebrile. 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 Patient return with worsening pain, radiated to bothlegs and increase weakness.  Consultants:  IR  Radiation oncology  Antibiotics:  none  HPI/Subjective: Afebrile; complaining of back pain. Denies urine incontinence/retention and also LE numbness.  Objective: Filed Vitals:   09/10/11 1536 09/10/11 1942 09/10/11 2000 09/11/11 0600  BP: 125/61 143/69 143/73 177/68  Pulse: 96 86 65 90  Temp: 99.4 F (37.4 C) 98.6 F (37 C) 98.5  F (36.9 C) 98.4 F (36.9 C)  TempSrc: Oral Oral Oral Oral  Resp: 22 18 18 18   SpO2: 97% 94% 97% 95%    Intake/Output Summary (Last 24 hours) at 09/11/11 1205 Last data filed at 09/11/11 0943  Gross per 24 hour  Intake    360 ml  Output   1205 ml  Net   -845 ml   There were no vitals filed for this visit.  Exam:   General:  NAD; complaining of back pain  Cardiovascular: mild tachycardia, no murmurs  Respiratory: CTA  Abdomen: soft, NT; positive BS  Neurologic: non focal deficit  Data Reviewed: Basic Metabolic Panel:  Lab 09/11/11 8119 09/10/11 1745 09/10/11 1743  NA 136 -- 135  K 4.5 -- 4.1  CL 100 -- 99  CO2 28 -- 26  GLUCOSE 54* -- 201*  BUN 18 -- 20  CREATININE 0.73 -- 0.65  CALCIUM 8.7 -- 8.3*  MG -- 2.0 --  PHOS -- 3.5 --   Liver Function Tests:  Lab 09/10/11 1743  AST 18  ALT 29  ALKPHOS 675*  BILITOT 0.2*  PROT 5.7*  ALBUMIN 2.7*   CBC:  Lab 09/11/11 0514 09/10/11 1743  WBC 13.2* 13.2*  NEUTROABS -- 9.5*  HGB 12.2* 11.7*  HCT 36.4* 34.3*  MCV 86.3 84.7  PLT 168 160   CBG:  Lab 09/11/11 0714 09/10/11 2321 09/10/11 2131  GLUCAP 62* 132* 66*      Studies: Dg Chest 2 View  08/24/2011  *RADIOLOGY REPORT*  Clinical Data: Fall, shortness of breath  CHEST - 2 VIEW  Comparison: 11/29/2008  Findings: Lungs are clear. No pleural  effusion or pneumothorax.  Mild cardiomegaly.  Degenerative changes of the visualized thoracolumbar spine.  IMPRESSION: No evidence of acute cardiopulmonary disease.  Original Report Authenticated By: Charline Bills, M.D.   Dg Lumbar Spine Complete  08/24/2011  *RADIOLOGY REPORT*  Clinical Data: Fall, low back pain.  LUMBAR SPINE - COMPLETE 4+ VIEW  Comparison: 07/05/2011  Findings: There is mild vertebral body height loss at T12, L1, and L4.  Underlying sclerotic changes at L1 and L4. No retropulsion. Multilevel degenerative changes.  No dislocation.  Advanced atherosclerotic calcification.  IMPRESSION: Mild  vertebral body height loss at T12, L1, and L4.  Underlying sclerotic changes of L1 and L4, therefore pathologic fracture/metastatic disease not excluded.  Recommend MRI follow-up.  Original Report Authenticated By: Waneta Martins, M.D.   Ct Chest W Contrast  08/25/2011  *RADIOLOGY REPORT*  Clinical Data: Evaluate for lung mass.  Metastatic disease with unknown primary.  CT CHEST WITH CONTRAST  Technique:  Multidetector CT imaging of the chest was performed following the standard protocol during bolus administration of intravenous contrast.  Contrast: 80mL OMNIPAQUE IOHEXOL 300 MG/ML  SOLN  Comparison: Thoracic spine MRI 08/24/2011 and MRI lumbar spine 08/25/2011.  Chest radiograph 08/24/2011  Findings: Normal heart size.  Atherosclerotic calcification of the coronary arteries, and of the normal caliber thoracic aorta.  Trace right pleural effusion.  Negative for left pleural effusion or pericardial effusion.  Borderline prominent 9 mm prevascular lymph node on image number 24.  No additional borderline or enlarged mediastinal, hilar, or axillary lymph nodes.  Negative for supraclavicular lymphadenopathy.  Soft tissues of the breasts demonstrate mild bilateral gynecomastia.  No evidence of breast mass.  Esophagus is mildly distended with areas distal aspect, but otherwise unremarkable.  Normal appearance of the visualized portion of the thyroid gland.  Mild respiratory motion is slightly limits the evaluation of the lung parenchyma.  The trachea and mainstem bronchi are patent. Negative for emphysema.  There is no pulmonary mass or nodule. Negative for airspace disease.  There are multiple irregular bony lesions, with a mixed lytic and sclerotic pattern consistent with diffuse bony metastatic disease. These lesions include the ribs bilaterally, the visualized thoracic and visualized upper lumbar vertebral bodies.  The largest focal lesions involve T5, where there is focal destruction of the posterior cortex  and soft tissue extension into the epidural space in the midline and to the left of midline, and L1 where there is diffuse sclerosis and lucency throughout the vertebral body, and an associated compression fracture deformity.  The most prominent areas of bony metastatic disease involving the ribs include the right third rib anteriorly where there is expansion of the cortex, and the left posterolateral fifth rib where there is a central lucent lesion, with associated pathologic fracture through the inferior cortex. Small sclerotic focus in the body of the sternum, for which a bony metastasis cannot be excluded (image #57 of the sagittal reformats).  Imaging of the upper abdomen demonstrates mild bilateral adrenal gland hyperplasia, left greater than right, without a focal adrenal gland mass.  The imaged portion of the liver enhances normally shows no evidence of mass.  The spleen is normal.  The imaged portion of the pancreas is normal.  The stomach has normal appearances.  No upper abdominal lymphadenopathy is identified.  No skin lesions are seen.  IMPRESSION:  1.  Negative for pulmonary nodule or mass.  A primary lung malignancy to explain the patient's diffuse bony metastatic disease is not identified on this chest CT,  and there is no evidence of pulmonary metastatic disease. 2.  Single upper normal/mildly prominent prevascular lymph node (9 mm). 3.  Diffuse osseous metastatic disease involving the visualized vertebral bodies and multiple bilateral ribs, and possible small sternal metastasis.  There is destruction of the posterior cortex of T5 with extension of soft tissue density into the epidural space, suspicious for focal epidural spread of tumor.  There is extensive involvement of the L1 vertebral body with vertebral body compression fracture, as described on prior MRI. 4.  Pathologic fracture through a metastatic lesion in the left fifth rib.  Original Report Authenticated By: Britta Mccreedy, M.D.   Ct  Lumbar Spine Wo Contrast  08/24/2011  *RADIOLOGY REPORT*  Clinical Data: Larey Seat.  Back pain.  CT LUMBAR SPINE WITHOUT CONTRAST  Technique:  Multidetector CT imaging of the lumbar spine was performed without intravenous contrast administration. Multiplanar CT image reconstructions were also generated.  Comparison: Radiography same day, 07/05/2011, 07/20/2008.  Findings: Lower portion of T12 is intact.  There is lytic destructive change of the left twelfth rib.  L1: Mixed lytic/sclerotic destructive change with loss of height of 25%.  No discernible intraspinal tumor.  L2:  Early lytic destruction of the posterior aspect of the vertebral body.  No loss of height.  No definable extraosseous tumor.  L3:  Early lytic destructive areas within the vertebral body.  No discernible extraosseous tumor or loss of height.  L4:  A mixed lytic/sclerotic destructive change with loss of height of 25%.  The tumor bulging into the ventral aspect of the spinal canal and involving the intervertebral foramen on the left.  L5:  Mixed lytic/sclerotic destructive change with loss of height of about 10%.  No apparent extraosseous tumor.  Sacrum and iliac bones:  Probable areas of mixed lytic and sclerotic tumor involvement.  No sign of extraosseous tumor.  IMPRESSION: Lytic and lytic/sclerotic lesions throughout the region of the skeleton imaged.  Findings consistent with metastatic disease or less likely myeloma.  25% loss of height at L1 and L4 and minimal loss of height that L5.  Extraosseous tumor within the ventral spinal canal and the intervertebral foramen on the left at L4.  Critical Value/emergent results were called by telephone at the time of interpretation on 08/24/2011 at  0900 hours to physician's assistant Laveda Norman, who verbally acknowledged these results.  Original Report Authenticated By: Thomasenia Sales, M.D.   Mr Thoracic Spine Wo Contrast  08/24/2011  *RADIOLOGY REPORT*  Clinical Data: Emergency department patient with acute  back pain. Evaluate for cord compression.  History of diabetes and spinal cord tumor.  Lumbar CT earlier today demonstrated probable osseous metastatic disease.  MRI THORACIC SPINE WITHOUT CONTRAST  Technique:  Multiplanar and multiecho pulse sequences of the thoracic spine were obtained without intravenous contrast.  Comparison: Lumbar spine CT 08/24/2011.  Findings: The patient was not able to complete the examination due to claustrophobia and pain.  Only sagittal imaging was obtained.  There is widespread osseous metastatic disease to the cervical thoracic spine.  Involvement of the thoracic region is greatest at T1, T5 and T12.  There is involvement of the posterior elements at multiple levels.  However, no pathologic fracture or significant epidural tumor is apparent.  There is no foraminal compromise or definite cord deformity in the thoracic region. Cervical assessment is limited.  No abnormal cord signal is seen.  There are no obvious paraspinal abnormalities as imaged in the sagittal plane.  IMPRESSION:  1.  Widespread osseous metastatic  disease to the cervical thoracic spine. 2.  No pathologic fracture, cord deformity or significant foraminal compromise identified in the thoracic spine.  Cervical assessment is limited.  The patient was not able to complete the examination.  No sagittal or postcontrast imaging was obtained. Consider CT of the chest, abdomen and pelvis for further evaluation of the osseous metastatic disease and assessment for a possible primary lesion.  Original Report Authenticated By: Gerrianne Scale, M.D.   Mr Lumbar Spine Wo Contrast  08/25/2011  *RADIOLOGY REPORT*  Clinical Data: Eval for cord compromise from tumor.  MRI thoracic spine performed yesterday.  Bony metastatic disease.  MRI LUMBAR SPINE WITHOUT CONTRAST  Technique:  Multiplanar and multiecho pulse sequences of the lumbar spine were obtained without intravenous contrast.  Comparison: CT 08/24/2011.  Findings: The  patient was able with complete sagittal T1 and T2- weighted imaging however axial imaging could not be performed due to intolerance of scanning.  The lumbar spine shows diffuse metastatic disease.  The metastatic infiltration is most pronounced at L1 and L4.  Every level is involved.  There is no definite cauda equina compression identified.  Bilateral foraminal stenosis is present at L4-L5 associated with extraosseous extension of tumor into both neural foramina, potentially affecting both L4 nerve roots, left greater than right.  L1 compression fracture appears similar to CT 08/24/2011.  IMPRESSION: 1.  The patient was only able to complete sagittal imaging T1 and T2 sequences.  There is no gross cauda equina compression. 2.  Diffuse metastatic disease, most prominent at L4-L5.  Although the central canal appears grossly patent, there is bilateral foraminal stenosis, left greater than right associated with extraosseous extension of tumor. 3.  L1 and L4 pathologic compression fractures appears similar to prior.  Original Report Authenticated By: Andreas Newport, M.D.    Scheduled Meds:   . sodium chloride   Intravenous Once  . aspirin EC  81 mg Oral Daily  . atorvastatin  20 mg Oral Daily  . dexamethasone  4 mg Oral Q6H  . enoxaparin (LOVENOX) injection  40 mg Subcutaneous Q24H  . glimepiride  4 mg Oral BID WC  . HYDROmorphone  1 mg Intravenous Once  . insulin aspart  0-9 Units Subcutaneous TID WC  . insulin glargine  40 Units Subcutaneous BID  . ondansetron  4 mg Intravenous Once  . pantoprazole  40 mg Oral Q1200  . sodium chloride  3 mL Intravenous Q12H  . traZODone  50 mg Oral QHS  . DISCONTD: dexamethasone  4 mg Oral Q12H   Continuous Infusions:   Time spent: > 30 minutes    Twan Harkin  [facility] Pager [phone]. If 8PM-8AM, please contact night-coverage at [email or web address], password [facility] 09/11/2011, 12:05 PM  LOS: 1 day

## 2011-09-11 NOTE — Progress Notes (Signed)
Aware of request for L4 vertebral body biopsy to aid in diagnosis. Pt was previously here and apparently left AMA or was discharged prior to procedure. Now readmitted for back pain secondary to presumed metastatic cancer. We are not able to accommodate this procedure today. Will have attending IR MD review.  Could potentially do procedure Monday 8/19.  Brayton El PA-C 09/11/2011 3:02 PM

## 2011-09-11 NOTE — Progress Notes (Signed)
Patient admitted to floor from ED.  When nurse arrived to room and began admission process patient very irritated, states "what difference does it make" to all questions nurse asked.  Stated "y'all are trying to kill me".  I assured patient that neither I nor the tech working with him were trying to kill him and we had never even met him before.  Patient very argumentative and irritated with every question nurse attempted to ask including questions about pain, mobility, home situation.  Patient stated "What difference does it make" and "I have already told y'all this" multiple times even though this nurse had not asked any of these questions to the patient before.  Patient states "I am dying", when I tried to ask patient what from he refused to answer.  During the admission interview he several times stated "I don't know what you are talking about" when questioning patient about his insulin and diabetes management at home as well as when I questioned him about his prostate cancer.  Patient repeatedly referred to his "nurse" and when I asked who this was he stated a nurse that comes out to his house from [place].  Patient asked for something to eat, we got him a sandwich tray and then he refused to eat it.  Blood sugar at the time was 66 and so patient did agree to eat 1/2 of his sandwich.

## 2011-09-11 NOTE — Progress Notes (Signed)
Patient continues to complain and be very agitated about everything that is done or not done for him.  Does not want blood pressure or blood sugar checked.  Just wants to be "left alone".  I told the patient that legally he can not come into the hospital and not have anything done for him. It is our legal responsibility as healthcare providers to provide care to him.  Patient stated he did not want to hear that.

## 2011-09-11 NOTE — Progress Notes (Signed)
Spoke with [facility] and they confirmed that pt is active with them as of 09/04/11. [facility] discharged [facility] from their service on 09/10/11.

## 2011-09-11 NOTE — Procedures (Signed)
  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  NARRATIVE:  The patient was brought to the CT Simulation planning suite.  Identity was confirmed.  All relevant records and images related to the planned course of therapy were reviewed.  The patient freely provided informed written consent to proceed with treatment after reviewing the details related to the planned course of therapy. The consent form was witnessed and verified by the simulation staff.  Then, the patient was set-up in a stable reproducible supine in the wing board position for radiation therapy.  CT images were obtained.  Surface markings were placed.  The CT images were loaded into the planning software.  Then the target and avoidance structures were contoured.  Treatment planning then occurred.  The radiation prescription was entered and confirmed.  A total of zero complex treatment devices were fabricated. I have requested : Isodose Plan.  I have ordered daily image guidance was coming CTs prior to treatment because of the patient's discomfort lying still in order to verify treatment position.  PLAN:  The patient will receive 20 Gy in 5 fractions.  He will begin palliative radiation as an inpatient on Monday, August 19 and complete palliative radiation to the spine on Friday, August 23. The patient is willing to consider biopsy in the future, however he is expressed willingness to proceed with empiric radiotherapy prior to even scheduling a biopsy given his pain symptoms.  ________________________________  Artist Pais Kathrynn Running, M.D.

## 2011-09-11 NOTE — Progress Notes (Signed)
Radiation Oncology         [phone] ________________________________  Inpatient Consultation  Name: Bradley [name] MRN: [id number]  Date: 09/10/2011  DOB: 1945-05-21  OZ:DGUYQ, Sherilyn Cooter, MD     REFERRING PHYSICIAN: Cephas Darby, MD  DIAGNOSIS: 66 year old gentleman with widespread bone lesions consistent with metastatic disease and painful involvement of the T5 level pending tissue diagnosis  HISTORY OF PRESENT ILLNESS::Bradley [name] is a 66 y.o. male who presented with back pain at the end of July. He was admitted with imaging workup revealing what appeared to be widespread bone metastases from an unknown primary. The patient met with my colleague, Dr. Mitzi Hansen at that time. The patient was tentatively set up for CT guided needle biopsy. However, he was concerned about the risk for death associated with CT biopsy and refused to proceed with the treatment. He was discharged from the hospital and returned with recurrent back pain. He has kindly been referred today to discuss possible radiotherapy with or without tissue biopsy.Marland Kitchen  PREVIOUS RADIATION THERAPY: No  PAST MEDICAL HISTORY:  has a past medical history of Hypertension; Diabetes mellitus; and Back pain.    PAST SURGICAL HISTORY: Past Surgical History  Procedure Date  . Total hip arthroplasty   . Toe resections     FAMILY HISTORY: family history is not on file.  SOCIAL HISTORY:  reports that he has been smoking.  He has never used smokeless tobacco. He reports that he does not drink alcohol or use illicit drugs.  ALLERGIES: Penicillins  MEDICATIONS:  Current Facility-Administered Medications  Medication Dose Route Frequency Provider Last Rate Last Dose  . 0.9 %  sodium chloride infusion   Intravenous Once Toy Baker, MD 125 mL/hr at 09/10/11 1756    . 0.9 %  sodium chloride infusion  250 mL Intravenous PRN Dorothea Ogle, MD      . albuterol (PROVENTIL HFA;VENTOLIN HFA) 108 (90 BASE) MCG/ACT inhaler 2 puff  2 puff  Inhalation Q6H PRN Dorothea Ogle, MD      . aspirin EC tablet 81 mg  81 mg Oral Daily Dorothea Ogle, MD   81 mg at 09/11/11 0959  . atorvastatin (LIPITOR) tablet 20 mg  20 mg Oral Daily Dorothea Ogle, MD   20 mg at 09/11/11 0959  . dexamethasone (DECADRON) tablet 4 mg  4 mg Oral Q6H Dorothea Ogle, MD   4 mg at 09/11/11 0705  . enoxaparin (LOVENOX) injection 40 mg  40 mg Subcutaneous Q24H Dorothea Ogle, MD   40 mg at 09/10/11 2214  . glimepiride (AMARYL) tablet 4 mg  4 mg Oral BID WC Dorothea Ogle, MD      . guaiFENesin-dextromethorphan [facility] DM) 100-10 MG/5ML syrup 10 mL  10 mL Oral Q6H PRN Caroline More, NP   10 mL at 09/10/11 2320  . HYDROcodone-acetaminophen (NORCO/VICODIN) 5-325 MG per tablet 1-2 tablet  1-2 tablet Oral Q4H PRN Dorothea Ogle, MD   1 tablet at 09/11/11 0306  . HYDROmorphone (DILAUDID) injection 1 mg  1 mg Intravenous Once Toy Baker, MD   1 mg at 09/10/11 1753  . HYDROmorphone (DILAUDID) injection 1 mg  1 mg Intravenous Q2H PRN Dorothea Ogle, MD   1 mg at 09/11/11 1338  . insulin aspart (novoLOG) injection 0-9 Units  0-9 Units Subcutaneous TID WC Vassie Loll, MD      . insulin glargine (LANTUS) injection 40 Units  40 Units Subcutaneous BID Dorothea Ogle,  MD   40 Units at 09/11/11 0959  . ondansetron (ZOFRAN) injection 4 mg  4 mg Intravenous Once Toy Baker, MD   4 mg at 09/10/11 1752  . ondansetron (ZOFRAN) tablet 4 mg  4 mg Oral Q6H PRN Dorothea Ogle, MD       Or  . ondansetron [facility]) injection 4 mg  4 mg Intravenous Q6H PRN Dorothea Ogle, MD      . pantoprazole (PROTONIX) EC tablet 40 mg  40 mg Oral Q1200 Dorothea Ogle, MD      . sodium chloride 0.9 % injection 3 mL  3 mL Intravenous Q12H Dorothea Ogle, MD   3 mL at 09/11/11 1000  . sodium chloride 0.9 % injection 3 mL  3 mL Intravenous PRN Dorothea Ogle, MD      . traZODone (DESYREL) tablet 50 mg  50 mg Oral QHS Dorothea Ogle, MD   50 mg at 09/10/11 2215  . DISCONTD: dexamethasone (DECADRON) tablet 4 mg   4 mg Oral Q12H Dorothea Ogle, MD      . DISCONTD: HYDROmorphone (DILAUDID) injection 1 mg  1 mg Intravenous Q3H PRN Dorothea Ogle, MD        REVIEW OF SYSTEMS:  A 15 point review of systems is documented in the electronic medical record. This was obtained by the nursing staff. However, I reviewed this with the patient to discuss relevant findings and make appropriate changes.  A comprehensive review of systems was negative.   PHYSICAL EXAM:  oral temperature is 98.4 F (36.9 C). His blood pressure is 177/68 and his pulse is 90. His respiration is 18 and oxygen saturation is 95%.   The patient is in no acute distress today. Is alert and oriented. Motor strength is intact in the upper lower extremity. Speech is fluent articulate.  LABORATORY DATA:  Lab Results  Component Value Date   WBC 13.2* 09/11/2011   HGB 12.2* 09/11/2011   HCT 36.4* 09/11/2011   MCV 86.3 09/11/2011   PLT 168 09/11/2011   Lab Results  Component Value Date   NA 136 09/11/2011   K 4.5 09/11/2011   CL 100 09/11/2011   CO2 28 09/11/2011   Lab Results  Component Value Date   ALT 29 09/10/2011   AST 18 09/10/2011   ALKPHOS 675* 09/10/2011   BILITOT 0.2* 09/10/2011     RADIOGRAPHY: Dg Chest 2 View  08/24/2011  *RADIOLOGY REPORT*  Clinical Data: Fall, shortness of breath  CHEST - 2 VIEW  Comparison: 11/29/2008  Findings: Lungs are clear. No pleural effusion or pneumothorax.  Mild cardiomegaly.  Degenerative changes of the visualized thoracolumbar spine.  IMPRESSION: No evidence of acute cardiopulmonary disease.  Original Report Authenticated By: Charline Bills, M.D.   Dg Lumbar Spine Complete  08/24/2011  *RADIOLOGY REPORT*  Clinical Data: Fall, low back pain.  LUMBAR SPINE - COMPLETE 4+ VIEW  Comparison: 07/05/2011  Findings: There is mild vertebral body height loss at T12, L1, and L4.  Underlying sclerotic changes at L1 and L4. No retropulsion. Multilevel degenerative changes.  No dislocation.  Advanced atherosclerotic  calcification.  IMPRESSION: Mild vertebral body height loss at T12, L1, and L4.  Underlying sclerotic changes of L1 and L4, therefore pathologic fracture/metastatic disease not excluded.  Recommend MRI follow-up.  Original Report Authenticated By: Waneta Martins, M.D.   Ct Chest W Contrast  08/25/2011  *RADIOLOGY REPORT*  Clinical Data: Evaluate for lung mass.  Metastatic disease with  unknown primary.  CT CHEST WITH CONTRAST  Technique:  Multidetector CT imaging of the chest was performed following the standard protocol during bolus administration of intravenous contrast.  Contrast: 80mL OMNIPAQUE IOHEXOL 300 MG/ML  SOLN  Comparison: Thoracic spine MRI 08/24/2011 and MRI lumbar spine 08/25/2011.  Chest radiograph 08/24/2011  Findings: Normal heart size.  Atherosclerotic calcification of the coronary arteries, and of the normal caliber thoracic aorta.  Trace right pleural effusion.  Negative for left pleural effusion or pericardial effusion.  Borderline prominent 9 mm prevascular lymph node on image number 24.  No additional borderline or enlarged mediastinal, hilar, or axillary lymph nodes.  Negative for supraclavicular lymphadenopathy.  Soft tissues of the breasts demonstrate mild bilateral gynecomastia.  No evidence of breast mass.  Esophagus is mildly distended with areas distal aspect, but otherwise unremarkable.  Normal appearance of the visualized portion of the thyroid gland.  Mild respiratory motion is slightly limits the evaluation of the lung parenchyma.  The trachea and mainstem bronchi are patent. Negative for emphysema.  There is no pulmonary mass or nodule. Negative for airspace disease.  There are multiple irregular bony lesions, with a mixed lytic and sclerotic pattern consistent with diffuse bony metastatic disease. These lesions include the ribs bilaterally, the visualized thoracic and visualized upper lumbar vertebral bodies.  The largest focal lesions involve T5, where there is focal  destruction of the posterior cortex and soft tissue extension into the epidural space in the midline and to the left of midline, and L1 where there is diffuse sclerosis and lucency throughout the vertebral body, and an associated compression fracture deformity.  The most prominent areas of bony metastatic disease involving the ribs include the right third rib anteriorly where there is expansion of the cortex, and the left posterolateral fifth rib where there is a central lucent lesion, with associated pathologic fracture through the inferior cortex. Small sclerotic focus in the body of the sternum, for which a bony metastasis cannot be excluded (image #57 of the sagittal reformats).  Imaging of the upper abdomen demonstrates mild bilateral adrenal gland hyperplasia, left greater than right, without a focal adrenal gland mass.  The imaged portion of the liver enhances normally shows no evidence of mass.  The spleen is normal.  The imaged portion of the pancreas is normal.  The stomach has normal appearances.  No upper abdominal lymphadenopathy is identified.  No skin lesions are seen.  IMPRESSION:  1.  Negative for pulmonary nodule or mass.  A primary lung malignancy to explain the patient's diffuse bony metastatic disease is not identified on this chest CT, and there is no evidence of pulmonary metastatic disease. 2.  Single upper normal/mildly prominent prevascular lymph node (9 mm). 3.  Diffuse osseous metastatic disease involving the visualized vertebral bodies and multiple bilateral ribs, and possible small sternal metastasis.  There is destruction of the posterior cortex of T5 with extension of soft tissue density into the epidural space, suspicious for focal epidural spread of tumor.  There is extensive involvement of the L1 vertebral body with vertebral body compression fracture, as described on prior MRI. 4.  Pathologic fracture through a metastatic lesion in the left fifth rib.  Original Report  Authenticated By: Britta Mccreedy, M.D.   Ct Lumbar Spine Wo Contrast  08/24/2011  *RADIOLOGY REPORT*  Clinical Data: Larey Seat.  Back pain.  CT LUMBAR SPINE WITHOUT CONTRAST  Technique:  Multidetector CT imaging of the lumbar spine was performed without intravenous contrast administration. Multiplanar CT image reconstructions  were also generated.  Comparison: Radiography same day, 07/05/2011, 07/20/2008.  Findings: Lower portion of T12 is intact.  There is lytic destructive change of the left twelfth rib.  L1: Mixed lytic/sclerotic destructive change with loss of height of 25%.  No discernible intraspinal tumor.  L2:  Early lytic destruction of the posterior aspect of the vertebral body.  No loss of height.  No definable extraosseous tumor.  L3:  Early lytic destructive areas within the vertebral body.  No discernible extraosseous tumor or loss of height.  L4:  A mixed lytic/sclerotic destructive change with loss of height of 25%.  The tumor bulging into the ventral aspect of the spinal canal and involving the intervertebral foramen on the left.  L5:  Mixed lytic/sclerotic destructive change with loss of height of about 10%.  No apparent extraosseous tumor.  Sacrum and iliac bones:  Probable areas of mixed lytic and sclerotic tumor involvement.  No sign of extraosseous tumor.  IMPRESSION: Lytic and lytic/sclerotic lesions throughout the region of the skeleton imaged.  Findings consistent with metastatic disease or less likely myeloma.  25% loss of height at L1 and L4 and minimal loss of height that L5.  Extraosseous tumor within the ventral spinal canal and the intervertebral foramen on the left at L4.  Critical Value/emergent results were called by telephone at the time of interpretation on 08/24/2011 at  0900 hours to physician's assistant Laveda Norman, who verbally acknowledged these results.  Original Report Authenticated By: Thomasenia Sales, M.D.   Mr Thoracic Spine Wo Contrast  08/24/2011  *RADIOLOGY REPORT*  Clinical  Data: Emergency department patient with acute back pain. Evaluate for cord compression.  History of diabetes and spinal cord tumor.  Lumbar CT earlier today demonstrated probable osseous metastatic disease.  MRI THORACIC SPINE WITHOUT CONTRAST  Technique:  Multiplanar and multiecho pulse sequences of the thoracic spine were obtained without intravenous contrast.  Comparison: Lumbar spine CT 08/24/2011.  Findings: The patient was not able to complete the examination due to claustrophobia and pain.  Only sagittal imaging was obtained.  There is widespread osseous metastatic disease to the cervical thoracic spine.  Involvement of the thoracic region is greatest at T1, T5 and T12.  There is involvement of the posterior elements at multiple levels.  However, no pathologic fracture or significant epidural tumor is apparent.  There is no foraminal compromise or definite cord deformity in the thoracic region. Cervical assessment is limited.  No abnormal cord signal is seen.  There are no obvious paraspinal abnormalities as imaged in the sagittal plane.  IMPRESSION:  1.  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 2.  No pathologic fracture, cord deformity or significant foraminal compromise identified in the thoracic spine.  Cervical assessment is limited.  The patient was not able to complete the examination.  No sagittal or postcontrast imaging was obtained. Consider CT of the chest, abdomen and pelvis for further evaluation of the osseous metastatic disease and assessment for a possible primary lesion.  Original Report Authenticated By: Gerrianne Scale, M.D.   Mr Lumbar Spine Wo Contrast  08/25/2011  *RADIOLOGY REPORT*  Clinical Data: Eval for cord compromise from tumor.  MRI thoracic spine performed yesterday.  Bony metastatic disease.  MRI LUMBAR SPINE WITHOUT CONTRAST  Technique:  Multiplanar and multiecho pulse sequences of the lumbar spine were obtained without intravenous contrast.   Comparison: CT 08/24/2011.  Findings: The patient was able with complete sagittal T1 and T2- weighted imaging however axial imaging could  not be performed due to intolerance of scanning.  The lumbar spine shows diffuse metastatic disease.  The metastatic infiltration is most pronounced at L1 and L4.  Every level is involved.  There is no definite cauda equina compression identified.  Bilateral foraminal stenosis is present at L4-L5 associated with extraosseous extension of tumor into both neural foramina, potentially affecting both L4 nerve roots, left greater than right.  L1 compression fracture appears similar to CT 08/24/2011.  IMPRESSION: 1.  The patient was only able to complete sagittal imaging T1 and T2 sequences.  There is no gross cauda equina compression. 2.  Diffuse metastatic disease, most prominent at L4-L5.  Although the central canal appears grossly patent, there is bilateral foraminal stenosis, left greater than right associated with extraosseous extension of tumor. 3.  L1 and L4 pathologic compression fractures appears similar to prior.  Original Report Authenticated By: Andreas Newport, M.D.      IMPRESSION: The patient is a very nice 66 year old gentleman with apparent widespread metastatic malignancy and painful involvement of the T5 vertebral level. He has radiating pain along the left hemithorax at the T5 level suggestive of neural foramen encroachment as visualized on CT with nerve root compression. The patient would benefit from palliative radiation in the setting of metastatic disease. However, he has not undergone any biopsy to confirm that he in fact has malignancy. However, radiographically findings are quite compelling for likely malignancy.  PLAN: Today, I spent time talking with the patient about his predicament. We talked about the role of radiation therapy in the management of bone metastases to prevent spinal cord injury and palliate pain. We talked about her radiation treatment  effects cancer and how treatment of his thoracic spine may potentially benefit him if the finding is in fact a malignant process. However, I cautioned the patient that the findings may not in fact represent malignancy. They could potentially reflect some form of infectious disease or granulomatous process. In these settings, radiation therapy may not be beneficial for him and may in fact be harmful to him without any real benefit. Statistically though, I suspect the findings represent metastatic malignancy. We this uncertainty in mind, the patient was perfectly comfortable proceeding with him curative radiotherapy for pain control in the hopes that radiation would help what appears to be a malignant process of the T5 level. We discussed the logistics and deliver radiation as well as the anticipated acute and late sequelae. The patient freely provided informed written consent to proceed and was brought to the radiation planning suite to proceed with treatment planning.  I spent 40 minutes minutes face to face with the patient and more than 50% of that time was spent in counseling and/or coordination of care.   ------------------------------------------------  Artist Pais. Kathrynn Running, M.D.

## 2011-09-11 NOTE — Telephone Encounter (Signed)
CALLED THE FLOOR,SPOKE WITH RN DANA, ASKED IF PATIENT WAS AGREEABLE TO COME DOWN FOR CT-SIM, SHE SAID SHE WAS WORKING ON IT HE WOULD COME DOWN,ASKED HER TO PREMEDICATE HIM FOR PAIN WITH THE DILAUDID AS HE WOULD BW LYING ON A HARD FLAT TABLE,CALLED BRIAN,CT SIM, AND GAVE STATUS OF PATIENT 1:30 PM

## 2011-09-11 NOTE — Progress Notes (Signed)
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 He was found to have extensive lytic lesions of the cervical, thoracic, and lumbar spine. There was no obvious primary tumor on CT scan of the chest or abdomen. PSA was normal. Serum protein electrophoresis normal. He had early cord compromise at L4. He was also seen in consultation by radiation oncology Dr. Mitzi Hansen. A needle biopsy of the L4 spine was scheduled to obtain a diagnosis. The patient signed out of the hospital AGAINST MEDICAL ADVICE. He fully understood that he likely had metastatic cancer and that there was a high risk he would become paralyzed without additional treatment. He was adamant that he absolutely did not want to have any form of chemotherapy treatment. He would consider radiation. I attempted to have my office staff contact him for a follow up appointment with Radiation Oncology. He called an ambulance to bring him back to the hospital again late yesterday since he has now decided that he will accept radiation treatments. Radiation oncology has been asked to consult again. Unfortunately I have nothing else to offer the patient at this time from the medical oncology standpoint.. I discussed my impressions above with Dr. Gwenlyn Perking this morning. Hospice care is appropriate and hospice has already made a home visit. He did see Dr. Redmond School, a primary care physician who makes house calls, in the past but hasn't seen him in 2 years. If it is possible to get Dr. Redmond School involved again after the patient is discharged, this would be ideal. If it is not, I am agreeable to acting as hospice attending so that he gets the attention and medication that he needs.

## 2011-09-12 LAB — GLUCOSE, CAPILLARY
Glucose-Capillary: 126 mg/dL — ABNORMAL HIGH (ref 70–99)
Glucose-Capillary: 341 mg/dL — ABNORMAL HIGH (ref 70–99)

## 2011-09-12 LAB — HEMOGLOBIN A1C: Hgb A1c MFr Bld: 9 % — ABNORMAL HIGH (ref <5.7)

## 2011-09-12 MED ORDER — IPRATROPIUM-ALBUTEROL 18-103 MCG/ACT IN AERO: 1.0000 | INHALATION_SPRAY | Freq: Four times a day (QID) | RESPIRATORY_TRACT | Status: DC | PRN

## 2011-09-12 MED ORDER — IPRATROPIUM-ALBUTEROL 20-100 MCG/ACT IN AERS
1.0000 | INHALATION_SPRAY | Freq: Four times a day (QID) | RESPIRATORY_TRACT | Status: DC | PRN
  Filled 2011-09-12: qty 4

## 2011-09-12 NOTE — Progress Notes (Signed)
Again, heard patient cursing nurse tech from another patients room.  Patient told nurse tech that he was going to "shit" in the bed because he could not walk.  Nurse tech went to get bedpan and reentered room, patient said he would not get on that "damn thing".  Charge nurse entered room and asked patient again not to be cursing staff, that was inappropriate.  Patient continued to curse.  I told patient if he is a patient in the hospital we have to provide care for him and we needed to help him either get up to [facility] or get on bedpan.  Patient cursed charge nurse and nurse tech telling us to get out of his room.  MD notified of patients behavior.  AC is on floor as well.

## 2011-09-12 NOTE — Progress Notes (Signed)
Heard patient cursing nurse tech from out in the hallway.  Heard patient tell Nurse tech that he would throw his food in the "goddamn" floor.  I went into the patients room and told him that this was not appropriate behavior.  Patient continued to curse and stated "yall need to get out of my room".  Called AC to notify her of patients behavior and asked that she come speak with him.

## 2011-09-12 NOTE — Progress Notes (Signed)
[facility] PROGRESS NOTE  Bradley [name] YQM:[id number] DOB: 1945/09/09 DOA: 09/10/2011 PCP: Florentina Jenny, MD  Assessment/Plan: 1-Back pain  - likely secondary to metastatic disease; at this point unclear source.  - Patient has now agree to radiation therapy and also biopsy for pathology in order to identify/ diagnose disease. -Will continue decadron and pain meds -Biopsy and radiation therapy scheduled for Monday  2-Metastatic cancer to bone  -will follow biopsy results and depending diagnosis and future patient decision of treatment involved oncology into the case. -radiation therapy and decadron as mentioned above for now.  3-Hypertension  - continue current regimen and readjust as indicated  -BP stable  4-DM (diabetes mellitus)  - uncontrolled and with complications (gastroparesis and neuropathy) - continue lantus; SSI (moderate) and meal coverage. Will readjust as needed. - A1C 9.0  5-Increase in Alkaline Phosphatase  - likely secondary to progressive bone metastasis  - now up from the last value 2 weeks ago, 400's --> 600's   6-Leukocytosis  - likely reactive especially with recent steroids use. - Urine is no showing any signs of UTI and he denies dysuria  -patient afebrile. 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 Patient return with worsening pain, radiated to bothlegs and increase weakness.  Consultants:  IR  Radiation oncology  Antibiotics:  none  HPI/Subjective: Afebrile; complaining of back pain. Denies urine incontinence/retention and also LE numbness. Patient is cursing staff and refusing care. AC and myself has discussed today options of treatment and behavior and  explained to him, that if he doesn't;t change and follow staff instructions and Md rec's, he will be discharge back home.  Objective: Filed Vitals:   09/11/11 1613 09/11/11 2200 09/12/11 0559 09/12/11 1353  BP:  142/57 147/67 143/60  Pulse:  84 80 89  Temp:  98 F (36.7 C) 98.1 F (36.7 C) 98.3 F (36.8 C)  TempSrc:  Oral Oral Oral  Resp:  18 20 20   Height: 5\' 8"  (1.727 m)     Weight: 84.097 kg (185 lb 6.4 oz)     SpO2:  96% 97% 98%    Intake/Output Summary (Last 24 hours) at 09/12/11 1507 Last data filed at 09/12/11 1300  Gross per 24 hour  Intake    720 ml  Output   1325 ml  Net   -605 ml   Filed Weights   09/11/11 1613  Weight: 84.097 kg (185 lb 6.4 oz)    Exam:   General:  NAD; complaining of back pain  Cardiovascular: regular rate, no murmurs  Respiratory: CTA  Abdomen: soft, NT; positive BS  Neurologic: non focal deficit  Data Reviewed: Basic Metabolic Panel:  Lab 09/11/11 5284 09/10/11 1745 09/10/11 1743  NA 136 -- 135  K 4.5 -- 4.1  CL 100 -- 99  CO2 28 -- 26  GLUCOSE 54* -- 201*  BUN 18 -- 20  CREATININE 0.73 -- 0.65  CALCIUM 8.7 -- 8.3*  MG -- 2.0 --  PHOS -- 3.5 --   Liver Function Tests:  Lab 09/10/11 1743  AST 18  ALT 29  ALKPHOS 675*  BILITOT 0.2*  PROT 5.7*  ALBUMIN 2.7*   CBC:  Lab 09/11/11 0514 09/10/11 1743  WBC 13.2* 13.2*  NEUTROABS -- 9.5*  HGB 12.2* 11.7*  HCT 36.4* 34.3*  MCV 86.3 84.7  PLT 168 160   CBG:  Lab 09/12/11 0728 09/11/11 2124 09/11/11 1755 09/11/11 1630 09/11/11 0714  GLUCAP 126* 239* 398* 406* 62*      Studies: Dg Chest 2 View  08/24/2011  *RADIOLOGY REPORT*  Clinical Data: Fall, shortness of breath  CHEST - 2 VIEW  Comparison: 11/29/2008  Findings: Lungs are clear. No pleural effusion or pneumothorax.  Mild cardiomegaly.  Degenerative changes of the visualized thoracolumbar spine.  IMPRESSION: No evidence of acute cardiopulmonary disease.  Original Report Authenticated By: Charline Bills,  M.D.   Dg Lumbar Spine Complete  08/24/2011  *RADIOLOGY REPORT*  Clinical Data: Fall, low back pain.  LUMBAR SPINE - COMPLETE 4+ VIEW  Comparison: 07/05/2011  Findings: There is mild vertebral body height loss at T12, L1, and L4.  Underlying sclerotic changes at L1 and L4. No retropulsion. Multilevel degenerative changes.  No dislocation.  Advanced atherosclerotic calcification.  IMPRESSION: Mild vertebral body height loss at T12, L1, and L4.  Underlying sclerotic changes of L1 and L4, therefore pathologic fracture/metastatic disease not excluded.  Recommend MRI follow-up.  Original Report Authenticated By: Waneta Martins, M.D.   Ct Chest W Contrast  08/25/2011  *RADIOLOGY REPORT*  Clinical Data: Evaluate for lung mass.  Metastatic disease with unknown primary.  CT CHEST WITH CONTRAST  Technique:  Multidetector CT imaging of the chest was performed following the standard protocol during bolus administration of intravenous contrast.  Contrast: 80mL OMNIPAQUE IOHEXOL 300 MG/ML  SOLN  Comparison: Thoracic spine MRI 08/24/2011 and MRI lumbar spine 08/25/2011.  Chest radiograph 08/24/2011  Findings: Normal heart size.  Atherosclerotic calcification of the coronary arteries, and of the normal caliber thoracic aorta.  Trace right pleural effusion.  Negative for left pleural effusion or pericardial effusion.  Borderline prominent 9 mm prevascular lymph node on image number 24.  No additional borderline or enlarged mediastinal, hilar, or axillary lymph nodes.  Negative for supraclavicular lymphadenopathy.  Soft tissues of the breasts demonstrate mild bilateral gynecomastia.  No evidence of breast mass.  Esophagus is mildly distended with areas distal aspect, but otherwise unremarkable.  Normal appearance of the visualized portion of the thyroid gland.  Mild respiratory motion is slightly limits the evaluation of the lung parenchyma.  The trachea and mainstem bronchi are patent. Negative for emphysema.  There is no  pulmonary mass or nodule. Negative for airspace disease.  There are multiple irregular bony lesions, with a mixed lytic and sclerotic pattern consistent with diffuse bony metastatic disease. These lesions include the ribs bilaterally, the visualized thoracic and visualized upper lumbar vertebral bodies.  The largest focal lesions involve T5, where there is focal destruction of the posterior cortex and soft tissue extension into the epidural space in the midline and to the left of midline, and L1 where there is diffuse sclerosis and lucency throughout the vertebral body, and an associated compression fracture deformity.  The most prominent areas of bony metastatic disease involving the ribs include the right third rib anteriorly where there is expansion of the cortex, and the left posterolateral fifth rib where there is a central lucent lesion, with associated pathologic fracture through the inferior cortex. Small sclerotic focus in the body of the sternum, for which a bony metastasis cannot be excluded (image #57 of the sagittal reformats).  Imaging of the upper abdomen demonstrates mild bilateral adrenal gland hyperplasia, left greater than right, without a  focal adrenal gland mass.  The imaged portion of the liver enhances normally shows no evidence of mass.  The spleen is normal.  The imaged portion of the pancreas is normal.  The stomach has normal appearances.  No upper abdominal lymphadenopathy is identified.  No skin lesions are seen.  IMPRESSION:  1.  Negative for pulmonary nodule or mass.  A primary lung malignancy to explain the patient's diffuse bony metastatic disease is not identified on this chest CT, and there is no evidence of pulmonary metastatic disease. 2.  Single upper normal/mildly prominent prevascular lymph node (9 mm). 3.  Diffuse osseous metastatic disease involving the visualized vertebral bodies and multiple bilateral ribs, and possible small sternal metastasis.  There is destruction of  the posterior cortex of T5 with extension of soft tissue density into the epidural space, suspicious for focal epidural spread of tumor.  There is extensive involvement of the L1 vertebral body with vertebral body compression fracture, as described on prior MRI. 4.  Pathologic fracture through a metastatic lesion in the left fifth rib.  Original Report Authenticated By: Britta Mccreedy, M.D.   Ct Lumbar Spine Wo Contrast  08/24/2011  *RADIOLOGY REPORT*  Clinical Data: Larey Seat.  Back pain.  CT LUMBAR SPINE WITHOUT CONTRAST  Technique:  Multidetector CT imaging of the lumbar spine was performed without intravenous contrast administration. Multiplanar CT image reconstructions were also generated.  Comparison: Radiography same day, 07/05/2011, 07/20/2008.  Findings: Lower portion of T12 is intact.  There is lytic destructive change of the left twelfth rib.  L1: Mixed lytic/sclerotic destructive change with loss of height of 25%.  No discernible intraspinal tumor.  L2:  Early lytic destruction of the posterior aspect of the vertebral body.  No loss of height.  No definable extraosseous tumor.  L3:  Early lytic destructive areas within the vertebral body.  No discernible extraosseous tumor or loss of height.  L4:  A mixed lytic/sclerotic destructive change with loss of height of 25%.  The tumor bulging into the ventral aspect of the spinal canal and involving the intervertebral foramen on the left.  L5:  Mixed lytic/sclerotic destructive change with loss of height of about 10%.  No apparent extraosseous tumor.  Sacrum and iliac bones:  Probable areas of mixed lytic and sclerotic tumor involvement.  No sign of extraosseous tumor.  IMPRESSION: Lytic and lytic/sclerotic lesions throughout the region of the skeleton imaged.  Findings consistent with metastatic disease or less likely myeloma.  25% loss of height at L1 and L4 and minimal loss of height that L5.  Extraosseous tumor within the ventral spinal canal and the  intervertebral foramen on the left at L4.  Critical Value/emergent results were called by telephone at the time of interpretation on 08/24/2011 at  0900 hours to physician's assistant Laveda Norman, who verbally acknowledged these results.  Original Report Authenticated By: Thomasenia Sales, M.D.   Mr Thoracic Spine Wo Contrast  08/24/2011  *RADIOLOGY REPORT*  Clinical Data: Emergency department patient with acute back pain. Evaluate for cord compression.  History of diabetes and spinal cord tumor.  Lumbar CT earlier today demonstrated probable osseous metastatic disease.  MRI THORACIC SPINE WITHOUT CONTRAST  Technique:  Multiplanar and multiecho pulse sequences of the thoracic spine were obtained without intravenous contrast.  Comparison: Lumbar spine CT 08/24/2011.  Findings: The patient was not able to complete the examination due to claustrophobia and pain.  Only sagittal imaging was obtained.  There is widespread osseous metastatic disease to the cervical thoracic  spine.  Involvement of the thoracic region is greatest at T1, T5 and T12.  There is involvement of the posterior elements at multiple levels.  However, no pathologic fracture or significant epidural tumor is apparent.  There is no foraminal compromise or definite cord deformity in the thoracic region. Cervical assessment is limited.  No abnormal cord signal is seen.  There are no obvious paraspinal abnormalities as imaged in the sagittal plane.  IMPRESSION:  1.  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 2.  No pathologic fracture, cord deformity or significant foraminal compromise identified in the thoracic spine.  Cervical assessment is limited.  The patient was not able to complete the examination.  No sagittal or postcontrast imaging was obtained. Consider CT of the chest, abdomen and pelvis for further evaluation of the osseous metastatic disease and assessment for a possible primary lesion.  Original Report Authenticated By:  Gerrianne Scale, M.D.   Mr Lumbar Spine Wo Contrast  08/25/2011  *RADIOLOGY REPORT*  Clinical Data: Eval for cord compromise from tumor.  MRI thoracic spine performed yesterday.  Bony metastatic disease.  MRI LUMBAR SPINE WITHOUT CONTRAST  Technique:  Multiplanar and multiecho pulse sequences of the lumbar spine were obtained without intravenous contrast.  Comparison: CT 08/24/2011.  Findings: The patient was able with complete sagittal T1 and T2- weighted imaging however axial imaging could not be performed due to intolerance of scanning.  The lumbar spine shows diffuse metastatic disease.  The metastatic infiltration is most pronounced at L1 and L4.  Every level is involved.  There is no definite cauda equina compression identified.  Bilateral foraminal stenosis is present at L4-L5 associated with extraosseous extension of tumor into both neural foramina, potentially affecting both L4 nerve roots, left greater than right.  L1 compression fracture appears similar to CT 08/24/2011.  IMPRESSION: 1.  The patient was only able to complete sagittal imaging T1 and T2 sequences.  There is no gross cauda equina compression. 2.  Diffuse metastatic disease, most prominent at L4-L5.  Although the central canal appears grossly patent, there is bilateral foraminal stenosis, left greater than right associated with extraosseous extension of tumor. 3.  L1 and L4 pathologic compression fractures appears similar to prior.  Original Report Authenticated By: Andreas Newport, M.D.    Scheduled Meds:    . aspirin EC  81 mg Oral Daily  . atorvastatin  20 mg Oral Daily  . dexamethasone  4 mg Oral Q6H  . enoxaparin (LOVENOX) injection  40 mg Subcutaneous Q24H  . glimepiride  4 mg Oral BID WC  . insulin aspart  0-15 Units Subcutaneous TID WC  . insulin aspart  4 Units Subcutaneous TID WC  . insulin glargine  40 Units Subcutaneous BID  . pantoprazole  40 mg Oral Q1200  . sodium chloride  3 mL Intravenous Q12H  . traZODone   50 mg Oral QHS  . DISCONTD: insulin aspart  0-9 Units Subcutaneous TID WC   Continuous Infusions:   Time spent: > 20 minutes    Aylah Yeary  [facility] Pager [phone]. If 8PM-8AM, please contact night-coverage at [email or web address], password [facility] 09/12/2011, 3:07 PM  LOS: 2 days

## 2011-09-13 ENCOUNTER — Encounter (HOSPITAL_COMMUNITY): Payer: Self-pay | Admitting: Radiology

## 2011-09-13 DIAGNOSIS — H04123 Dry eye syndrome of bilateral lacrimal glands: Secondary | ICD-10-CM | POA: Diagnosis present

## 2011-09-13 LAB — GLUCOSE, CAPILLARY
Glucose-Capillary: 263 mg/dL — ABNORMAL HIGH (ref 70–99)
Glucose-Capillary: 217 mg/dL — ABNORMAL HIGH (ref 70–99)

## 2011-09-13 MED ORDER — POLYVINYL ALCOHOL 1.4 % OP SOLN
2.0000 [drp] | OPHTHALMIC | Status: DC | PRN
  Administered 2011-09-13: 2 [drp] via OPHTHALMIC
  Filled 2011-09-13: qty 15

## 2011-09-13 NOTE — H&P (Signed)
Chief Complaint: Back pain Referring Physician:[place] HPI: Bradley [name] is an 66 y.o. male who has back pain likely secondary to metastatic disease but no primary path known yet. He was apparently here a few weeks ago and was to be set up for biopsy but left AMA. He is now back due to his back pain and is agreeable to biopsy and subsequent treatment. IR is requested to obtain tissue bx.  Past Medical History:  Past Medical History  Diagnosis Date  . Hypertension   . Diabetes mellitus   . Back pain     Past Surgical History:  Past Surgical History  Procedure Date  . Total hip arthroplasty   . Toe resections     Family History: History reviewed. No pertinent family history.  Social History:  reports that he has been smoking.  He has never used smokeless tobacco. He reports that he does not drink alcohol or use illicit drugs.  Allergies:  Allergies  Allergen Reactions  . Penicillins     Pt reports he is allergic to all antibiotics. States that he "cannot take antibiotics because they make him go to the bathroom a lot and I won't take them."     Medications: Medications Prior to Admission  Medication Sig Dispense Refill  . albuterol (PROVENTIL HFA;VENTOLIN HFA) 108 (90 BASE) MCG/ACT inhaler Inhale 2 puffs into the lungs every 6 (six) hours as needed. For shortness of breath      . aspirin 81 MG tablet Take 81 mg by mouth daily.      Marland Kitchen atorvastatin (LIPITOR) 20 MG tablet Take 20 mg by mouth daily.      Marland Kitchen glimepiride (AMARYL) 4 MG tablet Take 4 mg by mouth 2 (two) times daily.      . insulin glargine (LANTUS SOLOSTAR) 100 UNIT/ML injection Inject 40 Units into the skin 2 (two) times daily.  10 mL  0  . omeprazole (PRILOSEC) 20 MG capsule Take 1 capsule (20 mg total) by mouth daily.  30 capsule  0  . traMADol (ULTRAM) 50 MG tablet Take 50 mg by mouth every 6 (six) hours as needed. For pain      . traZODone (DESYREL) 50 MG tablet Take 50 mg by mouth at bedtime.      . vitamin  B-12 (CYANOCOBALAMIN) 1000 MCG tablet Take 1,000 mcg by mouth daily.        Please HPI for pertinent positives, otherwise complete 10 system ROS negative.  Physical Exam: Blood pressure 144/91, pulse 80, temperature 98.2 F (36.8 C), temperature source Oral, resp. rate 20, height 5\' 8"  (1.727 m), weight 185 lb 6.4 oz (84.097 kg), SpO2 97.00%. Body mass index is 28.19 kg/(m^2).   General Appearance:  Alert, cooperative, no distress, appears stated age  Head:  Normocephalic, without obvious abnormality, atraumatic  ENT: Unremarkable  Neck: Supple, symmetrical, trachea midline, no adenopathy, thyroid: not enlarged, symmetric, no tenderness/mass/nodules  Lungs:   Clear to auscultation bilaterally, no w/r/r, respirations unlabored without use of accessory muscles.  Heart:  Regular rate and rhythm, S1, S2 normal, no murmur, rub or gallop. Carotids 2+ without bruit.  Abdomen:   Soft, non-tender, non distended. Bowel sounds active all four quadrants,  no masses, no organomegaly.  Extremities: Extremities normal, atraumatic, no cyanosis or edema  Neurologic: Normal affect, no gross deficits.   Results for orders placed during the hospital encounter of 09/10/11 (from the past 48 hour(s))  GLUCOSE, CAPILLARY     Status: Abnormal   Collection Time  09/11/11  4:30 PM      Component Value Range Comment   Glucose-Capillary 406 (*) 70 - 99 mg/dL    Comment 1 Notify RN     GLUCOSE, CAPILLARY     Status: Abnormal   Collection Time   09/11/11  5:55 PM      Component Value Range Comment   Glucose-Capillary 398 (*) 70 - 99 mg/dL    Comment 1 Notify RN     GLUCOSE, CAPILLARY     Status: Abnormal   Collection Time   09/11/11  9:24 PM      Component Value Range Comment   Glucose-Capillary 239 (*) 70 - 99 mg/dL    Comment 1 Notify RN     GLUCOSE, CAPILLARY     Status: Abnormal   Collection Time   09/12/11  7:28 AM      Component Value Range Comment   Glucose-Capillary 126 (*) 70 - 99 mg/dL    Comment  1 Notify RN     GLUCOSE, CAPILLARY     Status: Abnormal   Collection Time   09/12/11 11:08 AM      Component Value Range Comment   Glucose-Capillary 268 (*) 70 - 99 mg/dL    Comment 1 Notify RN     GLUCOSE, CAPILLARY     Status: Abnormal   Collection Time   09/12/11  5:03 PM      Component Value Range Comment   Glucose-Capillary 341 (*) 70 - 99 mg/dL    Comment 1 Notify RN     GLUCOSE, CAPILLARY     Status: Abnormal   Collection Time   09/12/11  9:09 PM      Component Value Range Comment   Glucose-Capillary 263 (*) 70 - 99 mg/dL   GLUCOSE, CAPILLARY     Status: Abnormal   Collection Time   09/13/11  7:29 AM      Component Value Range Comment   Glucose-Capillary 217 (*) 70 - 99 mg/dL    No results found.  Assessment/Plan Back pain Diffuse metastatic disease of the thoracic and lumba vertebrae. Review of imaging with IR MD also identifies disease in the post sacral soft tissue ans well as iliac crests which are also amenable to bx Discussed with pt plans for CT guided biopsy of one of these lesions tomorrow. Procedure including risks and complications explained in detail. Labs ok. Will hold Lovenox tonight and NPO after MN.  Lareen Mullings PA-C 09/13/2011, 11:12 AM

## 2011-09-13 NOTE — Progress Notes (Addendum)
[facility] PROGRESS NOTE  Bradley [name] WUJ:[id number] DOB: 07/16/1945 DOA: 09/10/2011 PCP: Florentina Jenny, MD  Assessment/Plan: 1-Back pain  - likely secondary to metastatic disease; at this point unclear source.  - Patient has now agree to radiation therapy (which is planning to start tomorrow 09/14/11) and also biopsy for pathology in order to identify/diagnose disease (plan for 09/14/11 around 11:00am to 12:00 PM. -Will continue decadron. -continue pain meds and supportive care.  2-Metastatic cancer to bone  -will follow biopsy results and depending diagnosis and future patient decision of treatment involved oncology into the case. -radiation therapy and decadron as mentioned above for now.  3-Hypertension  - continue current regimen and readjust as needed; BP stable   4-DM (diabetes mellitus)  - uncontrolled and with complications, gastroparesis and neuropathy  - continue home dose of lantus and readjust as needed. Will also continue sliding scale (moderate) and meal coverage. CBGs might be also elevated secondary to current steroids use. - A1C 9.0   5-Increase in Alkaline Phosphatase  - likely secondary to progressive bone metastasis  - now up from the last value 2 weeks ago, 400's --> 600's   6-Leukocytosis  - likely reactive especially with recent steroids use. -patient afebrile. -No antibiotics at this moment; urine w/o signs of infection and patient w/o dysuria  7-GERD  - continue Protonix   8-Dry eyes: -Chronic and bilaterally. Will start artifical tears to provide symptoms relief.   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 Patient return with worsening pain, radiated to bothlegs and increase weakness.  Consultants:  IR  Radiation  oncology  Antibiotics:  none  HPI/Subjective: Afebrile; complaining of back pain (but better). Denies urine incontinence/retention and also LE numbness. Some BLE weakness reproted.  Objective: Filed Vitals:   09/12/11 0559 09/12/11 1353 09/12/11 2309 09/13/11 0600  BP: 147/67 143/60 159/69 144/91  Pulse: 80 89 97 80  Temp: 98.1 F (36.7 C) 98.3 F (36.8 C) 98.8 F (37.1 C) 98.2 F (36.8 C)  TempSrc: Oral Oral Oral Oral  Resp: 20 20 20 20   Height:      Weight:      SpO2: 97% 98% 96% 97%    Intake/Output Summary (Last 24 hours) at 09/13/11 1137 Last data filed at 09/13/11 0900  Gross per 24 hour  Intake    480 ml  Output   1300 ml  Net   -820 ml   Filed Weights   09/11/11 1613  Weight: 84.097 kg (185 lb 6.4 oz)    Exam:   General:  NAD; complaining of back pain  Cardiovascular: mild tachycardia, no murmurs  Respiratory: CTA  Abdomen: soft, NT; positive BS  Neurologic: non focal deficit; patient reports BLE weakness due to metastatic bone lesions and spinal cord compression most likely  Data Reviewed: Basic Metabolic Panel:  Lab 09/11/11 9562 09/10/11 1745 09/10/11 1743  NA 136 -- 135  K 4.5 -- 4.1  CL 100 -- 99  CO2 28 -- 26  GLUCOSE 54* -- 201*  BUN 18 -- 20  CREATININE 0.73 -- 0.65  CALCIUM 8.7 -- 8.3*  MG -- 2.0 --  PHOS -- 3.5 --   Liver Function Tests:  Lab 09/10/11 1743  AST 18  ALT 29  ALKPHOS 675*  BILITOT 0.2*  PROT  5.7*  ALBUMIN 2.7*   CBC:  Lab 09/11/11 0514 09/10/11 1743  WBC 13.2* 13.2*  NEUTROABS -- 9.5*  HGB 12.2* 11.7*  HCT 36.4* 34.3*  MCV 86.3 84.7  PLT 168 160   CBG:  Lab 09/13/11 1121 09/13/11 0729 09/12/11 2109 09/12/11 1703 09/12/11 1108  GLUCAP 263* 217* 263* 341* 268*      Studies: Dg Chest 2 View  08/24/2011  *RADIOLOGY REPORT*  Clinical Data: Fall, shortness of breath  CHEST - 2 VIEW  Comparison: 11/29/2008  Findings: Lungs are clear. No pleural effusion or pneumothorax.  Mild cardiomegaly.   Degenerative changes of the visualized thoracolumbar spine.  IMPRESSION: No evidence of acute cardiopulmonary disease.  Original Report Authenticated By: Charline Bills, M.D.   Dg Lumbar Spine Complete  08/24/2011  *RADIOLOGY REPORT*  Clinical Data: Fall, low back pain.  LUMBAR SPINE - COMPLETE 4+ VIEW  Comparison: 07/05/2011  Findings: There is mild vertebral body height loss at T12, L1, and L4.  Underlying sclerotic changes at L1 and L4. No retropulsion. Multilevel degenerative changes.  No dislocation.  Advanced atherosclerotic calcification.  IMPRESSION: Mild vertebral body height loss at T12, L1, and L4.  Underlying sclerotic changes of L1 and L4, therefore pathologic fracture/metastatic disease not excluded.  Recommend MRI follow-up.  Original Report Authenticated By: Waneta Martins, M.D.   Ct Chest W Contrast  08/25/2011  *RADIOLOGY REPORT*  Clinical Data: Evaluate for lung mass.  Metastatic disease with unknown primary.  CT CHEST WITH CONTRAST  Technique:  Multidetector CT imaging of the chest was performed following the standard protocol during bolus administration of intravenous contrast.  Contrast: 80mL OMNIPAQUE IOHEXOL 300 MG/ML  SOLN  Comparison: Thoracic spine MRI 08/24/2011 and MRI lumbar spine 08/25/2011.  Chest radiograph 08/24/2011  Findings: Normal heart size.  Atherosclerotic calcification of the coronary arteries, and of the normal caliber thoracic aorta.  Trace right pleural effusion.  Negative for left pleural effusion or pericardial effusion.  Borderline prominent 9 mm prevascular lymph node on image number 24.  No additional borderline or enlarged mediastinal, hilar, or axillary lymph nodes.  Negative for supraclavicular lymphadenopathy.  Soft tissues of the breasts demonstrate mild bilateral gynecomastia.  No evidence of breast mass.  Esophagus is mildly distended with areas distal aspect, but otherwise unremarkable.  Normal appearance of the visualized portion of the thyroid  gland.  Mild respiratory motion is slightly limits the evaluation of the lung parenchyma.  The trachea and mainstem bronchi are patent. Negative for emphysema.  There is no pulmonary mass or nodule. Negative for airspace disease.  There are multiple irregular bony lesions, with a mixed lytic and sclerotic pattern consistent with diffuse bony metastatic disease. These lesions include the ribs bilaterally, the visualized thoracic and visualized upper lumbar vertebral bodies.  The largest focal lesions involve T5, where there is focal destruction of the posterior cortex and soft tissue extension into the epidural space in the midline and to the left of midline, and L1 where there is diffuse sclerosis and lucency throughout the vertebral body, and an associated compression fracture deformity.  The most prominent areas of bony metastatic disease involving the ribs include the right third rib anteriorly where there is expansion of the cortex, and the left posterolateral fifth rib where there is a central lucent lesion, with associated pathologic fracture through the inferior cortex. Small sclerotic focus in the body of the sternum, for which a bony metastasis cannot be excluded (image #57 of the sagittal reformats).  Imaging of the  upper abdomen demonstrates mild bilateral adrenal gland hyperplasia, left greater than right, without a focal adrenal gland mass.  The imaged portion of the liver enhances normally shows no evidence of mass.  The spleen is normal.  The imaged portion of the pancreas is normal.  The stomach has normal appearances.  No upper abdominal lymphadenopathy is identified.  No skin lesions are seen.  IMPRESSION:  1.  Negative for pulmonary nodule or mass.  A primary lung malignancy to explain the patient's diffuse bony metastatic disease is not identified on this chest CT, and there is no evidence of pulmonary metastatic disease. 2.  Single upper normal/mildly prominent prevascular lymph node (9 mm). 3.   Diffuse osseous metastatic disease involving the visualized vertebral bodies and multiple bilateral ribs, and possible small sternal metastasis.  There is destruction of the posterior cortex of T5 with extension of soft tissue density into the epidural space, suspicious for focal epidural spread of tumor.  There is extensive involvement of the L1 vertebral body with vertebral body compression fracture, as described on prior MRI. 4.  Pathologic fracture through a metastatic lesion in the left fifth rib.  Original Report Authenticated By: Britta Mccreedy, M.D.   Ct Lumbar Spine Wo Contrast  08/24/2011  *RADIOLOGY REPORT*  Clinical Data: Larey Seat.  Back pain.  CT LUMBAR SPINE WITHOUT CONTRAST  Technique:  Multidetector CT imaging of the lumbar spine was performed without intravenous contrast administration. Multiplanar CT image reconstructions were also generated.  Comparison: Radiography same day, 07/05/2011, 07/20/2008.  Findings: Lower portion of T12 is intact.  There is lytic destructive change of the left twelfth rib.  L1: Mixed lytic/sclerotic destructive change with loss of height of 25%.  No discernible intraspinal tumor.  L2:  Early lytic destruction of the posterior aspect of the vertebral body.  No loss of height.  No definable extraosseous tumor.  L3:  Early lytic destructive areas within the vertebral body.  No discernible extraosseous tumor or loss of height.  L4:  A mixed lytic/sclerotic destructive change with loss of height of 25%.  The tumor bulging into the ventral aspect of the spinal canal and involving the intervertebral foramen on the left.  L5:  Mixed lytic/sclerotic destructive change with loss of height of about 10%.  No apparent extraosseous tumor.  Sacrum and iliac bones:  Probable areas of mixed lytic and sclerotic tumor involvement.  No sign of extraosseous tumor.  IMPRESSION: Lytic and lytic/sclerotic lesions throughout the region of the skeleton imaged.  Findings consistent with metastatic  disease or less likely myeloma.  25% loss of height at L1 and L4 and minimal loss of height that L5.  Extraosseous tumor within the ventral spinal canal and the intervertebral foramen on the left at L4.  Critical Value/emergent results were called by telephone at the time of interpretation on 08/24/2011 at  0900 hours to physician's assistant Laveda Norman, who verbally acknowledged these results.  Original Report Authenticated By: Thomasenia Sales, M.D.   Mr Thoracic Spine Wo Contrast  08/24/2011  *RADIOLOGY REPORT*  Clinical Data: Emergency department patient with acute back pain. Evaluate for cord compression.  History of diabetes and spinal cord tumor.  Lumbar CT earlier today demonstrated probable osseous metastatic disease.  MRI THORACIC SPINE WITHOUT CONTRAST  Technique:  Multiplanar and multiecho pulse sequences of the thoracic spine were obtained without intravenous contrast.  Comparison: Lumbar spine CT 08/24/2011.  Findings: The patient was not able to complete the examination due to claustrophobia and pain.  Only sagittal  imaging was obtained.  There is widespread osseous metastatic disease to the cervical thoracic spine.  Involvement of the thoracic region is greatest at T1, T5 and T12.  There is involvement of the posterior elements at multiple levels.  However, no pathologic fracture or significant epidural tumor is apparent.  There is no foraminal compromise or definite cord deformity in the thoracic region. Cervical assessment is limited.  No abnormal cord signal is seen.  There are no obvious paraspinal abnormalities as imaged in the sagittal plane.  IMPRESSION:  1.  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 2.  No pathologic fracture, cord deformity or significant foraminal compromise identified in the thoracic spine.  Cervical assessment is limited.  The patient was not able to complete the examination.  No sagittal or postcontrast imaging was obtained. Consider CT of the  chest, abdomen and pelvis for further evaluation of the osseous metastatic disease and assessment for a possible primary lesion.  Original Report Authenticated By: Gerrianne Scale, M.D.   Mr Lumbar Spine Wo Contrast  08/25/2011  *RADIOLOGY REPORT*  Clinical Data: Eval for cord compromise from tumor.  MRI thoracic spine performed yesterday.  Bony metastatic disease.  MRI LUMBAR SPINE WITHOUT CONTRAST  Technique:  Multiplanar and multiecho pulse sequences of the lumbar spine were obtained without intravenous contrast.  Comparison: CT 08/24/2011.  Findings: The patient was able with complete sagittal T1 and T2- weighted imaging however axial imaging could not be performed due to intolerance of scanning.  The lumbar spine shows diffuse metastatic disease.  The metastatic infiltration is most pronounced at L1 and L4.  Every level is involved.  There is no definite cauda equina compression identified.  Bilateral foraminal stenosis is present at L4-L5 associated with extraosseous extension of tumor into both neural foramina, potentially affecting both L4 nerve roots, left greater than right.  L1 compression fracture appears similar to CT 08/24/2011.  IMPRESSION: 1.  The patient was only able to complete sagittal imaging T1 and T2 sequences.  There is no gross cauda equina compression. 2.  Diffuse metastatic disease, most prominent at L4-L5.  Although the central canal appears grossly patent, there is bilateral foraminal stenosis, left greater than right associated with extraosseous extension of tumor. 3.  L1 and L4 pathologic compression fractures appears similar to prior.  Original Report Authenticated By: Andreas Newport, M.D.    Scheduled Meds:    . aspirin EC  81 mg Oral Daily  . atorvastatin  20 mg Oral Daily  . dexamethasone  4 mg Oral Q6H  . enoxaparin (LOVENOX) injection  40 mg Subcutaneous Q24H  . glimepiride  4 mg Oral BID WC  . insulin aspart  0-15 Units Subcutaneous TID WC  . insulin aspart  4  Units Subcutaneous TID WC  . insulin glargine  40 Units Subcutaneous BID  . pantoprazole  40 mg Oral Q1200  . sodium chloride  3 mL Intravenous Q12H  . traZODone  50 mg Oral QHS   Continuous Infusions:   Time spent: > 20 minutes    Keenan Dimitrov  [facility] Pager [phone]. If 8PM-8AM, please contact night-coverage at [email or web address], password [facility] 09/13/2011, 11:37 AM  LOS: 3 days

## 2011-09-14 ENCOUNTER — Ambulatory Visit: Admitting: Radiation Oncology

## 2011-09-14 ENCOUNTER — Encounter: Payer: Self-pay | Admitting: Radiation Oncology

## 2011-09-14 ENCOUNTER — Inpatient Hospital Stay (HOSPITAL_COMMUNITY)

## 2011-09-14 DIAGNOSIS — C801 Malignant (primary) neoplasm, unspecified: Secondary | ICD-10-CM

## 2011-09-14 DIAGNOSIS — K219 Gastro-esophageal reflux disease without esophagitis: Secondary | ICD-10-CM

## 2011-09-14 DIAGNOSIS — H04129 Dry eye syndrome of unspecified lacrimal gland: Secondary | ICD-10-CM

## 2011-09-14 DIAGNOSIS — E119 Type 2 diabetes mellitus without complications: Secondary | ICD-10-CM

## 2011-09-14 DIAGNOSIS — C7952 Secondary malignant neoplasm of bone marrow: Principal | ICD-10-CM

## 2011-09-14 DIAGNOSIS — M549 Dorsalgia, unspecified: Secondary | ICD-10-CM

## 2011-09-14 DIAGNOSIS — I10 Essential (primary) hypertension: Secondary | ICD-10-CM

## 2011-09-14 LAB — GLUCOSE, CAPILLARY
Glucose-Capillary: 163 mg/dL — ABNORMAL HIGH (ref 70–99)
Glucose-Capillary: 109 mg/dL — ABNORMAL HIGH (ref 70–99)
Glucose-Capillary: 91 mg/dL (ref 70–99)

## 2011-09-14 LAB — APTT: aPTT: 24 seconds (ref 24–37)

## 2011-09-14 MED ORDER — FENTANYL CITRATE 0.05 MG/ML IJ SOLN
INTRAMUSCULAR | Status: AC | PRN
  Administered 2011-09-14 (×2): 100 ug via INTRAVENOUS

## 2011-09-14 MED ORDER — AMLODIPINE BESYLATE 5 MG PO TABS
5.0000 mg | ORAL_TABLET | Freq: Every day | ORAL | Status: DC
  Administered 2011-09-15: 5 mg via ORAL
  Filled 2011-09-14 (×2): qty 1

## 2011-09-14 MED ORDER — MIDAZOLAM HCL 5 MG/5ML IJ SOLN
INTRAMUSCULAR | Status: AC | PRN
  Administered 2011-09-14: 2 mg via INTRAVENOUS

## 2011-09-14 NOTE — ED Notes (Signed)
Pt. C/o intense back pain while attempting to place in prone postion. Initially Fentanyl IV was given during transfer. Pt. Claimed no relief. Additional Fentenyl IV given for positioning. Pt. Is A+Ox3. 2L O2 applied. MD aware.

## 2011-09-14 NOTE — Progress Notes (Unsigned)
  Radiation Oncology         [phone]) [phone] ________________________________  Name: Bradley [name] MRN: [id number]  Date: 09/14/2011  DOB: 1945/12/17  Simulation Verification Note  Status: inpatient  NARRATIVE: The patient was brought to the treatment unit and placed in the planned treatment position. The clinical setup was verified. Then port films were obtained and uploaded to the radiation oncology medical record software.  The treatment beams were carefully compared against the planned radiation fields. The position location and shape of the radiation fields was reviewed. They targeted volume of tissue appears to be appropriately covered by the radiation beams. Organs at risk appear to be excluded as planned.  Based on my personal review, I approved the simulation verification. The patient's treatment will proceed as planned.  ------------------------------------------------  Artist Pais Kathrynn Running, M.D.

## 2011-09-14 NOTE — Progress Notes (Signed)
[facility] PROGRESS NOTE  Bradley [name] JYN:[id number] DOB: [birth year] DOA: 09/10/2011 PCP: Florentina Jenny, MD  Assessment/Plan: 1-Back pain  - likely secondary to metastatic disease; at this point unclear source.  - Patient has now agree to radiation therapy (which is planning to start today 09/14/11) and also biopsy for pathology in order to identify/diagnose disease (plan for today 09/14/11). -Will continue decadron. -continue pain meds and supportive care. -Most likely home 1-2 with hospice following while waiting on biopsy and to follow with oncology as an outpatient.  2-Metastatic cancer to bone  -will follow biopsy results and depending diagnosis and future patient decision of treatment involved oncology into the case. -radiation therapy and decadron as mentioned above for now.  3-Hypertension  - higher recently; most likely due to steroids. -Will start low dose norvasc.   4-DM (diabetes mellitus)  - uncontrolled and with complications, gastroparesis and neuropathy  - continue home dose of lantus and readjust as needed. Will also continue sliding scale (moderate) and meal coverage. CBGs might be also elevated secondary to current steroids use. - A1C 9.0   5-Increase in Alkaline Phosphatase  - likely secondary to progressive bone metastasis  - now up from the last value 2 weeks ago, 400's --> 600's   6-Leukocytosis  - likely reactive especially with recent steroids use. -patient afebrile. -No antibiotics at this moment; urine w/o signs of infection and patient w/o dysuria  7-GERD  - continue Protonix   8-Dry eyes: -Chronic and bilaterally. Will continue artifical tears to provide symptoms relief.   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 Patient return  with worsening pain, radiated to bothlegs and increase weakness.  Consultants:  IR  Radiation oncology  Antibiotics:  none  HPI/Subjective: Afebrile; complaining of back pain. Denies urine incontinence/retention and also LE numbness. In agreement for biopsy and radiation tx today   Objective: Filed Vitals:   09/14/11 1039 09/14/11 1043 09/14/11 1047 09/14/11 1111  BP: 128/76 137/72 129/78 165/88  Pulse: 82 84 83 87  Temp:    98.7 F (37.1 C)  TempSrc:    Oral  Resp: 10 17 11 16   Height:      Weight:      SpO2: 100% 100% 100% 98%    Intake/Output Summary (Last 24 hours) at 09/14/11 1124 Last data filed at 09/14/11 0600  Gross per 24 hour  Intake    240 ml  Output   1850 ml  Net  -1610 ml   Filed Weights   09/11/11 1613  Weight: 84.097 kg (185 lb 6.4 oz)    Exam:   General:  NAD; complaining of back pain  Cardiovascular: mild tachycardia, no murmurs  Respiratory: scattered wheezing  Abdomen: soft, NT; positive BS  Neurologic: non focal deficit; patient reports BLE weakness due to metastatic bone lesions and spinal cord compression most likely  Data Reviewed: Basic Metabolic Panel:  Lab 09/11/11 8657 09/10/11 1745 09/10/11 1743  NA 136 -- 135  K 4.5 -- 4.1  CL 100 -- 99  CO2 28 -- 26  GLUCOSE 54* -- 201*  BUN 18 -- 20  CREATININE 0.73 -- 0.65  CALCIUM 8.7 -- 8.3*  MG -- 2.0 --  PHOS -- 3.5 --   Liver Function Tests:  Lab 09/10/11 1743  AST  18  ALT 29  ALKPHOS 675*  BILITOT 0.2*  PROT 5.7*  ALBUMIN 2.7*   CBC:  Lab 09/11/11 0514 09/10/11 1743  WBC 13.2* 13.2*  NEUTROABS -- 9.5*  HGB 12.2* 11.7*  HCT 36.4* 34.3*  MCV 86.3 84.7  PLT 168 160   CBG:  Lab 09/14/11 0729 09/13/11 2140 09/13/11 1648 09/13/11 1121 09/13/11 0729  GLUCAP 109* 163* 297* 263* 217*      Studies: Dg Chest 2 View  08/24/2011  *RADIOLOGY REPORT*  Clinical Data: Fall, shortness of breath  CHEST - 2 VIEW  Comparison: 11/29/2008  Findings: Lungs are clear. No  pleural effusion or pneumothorax.  Mild cardiomegaly.  Degenerative changes of the visualized thoracolumbar spine.  IMPRESSION: No evidence of acute cardiopulmonary disease.  Original Report Authenticated By: Charline Bills, M.D.   Dg Lumbar Spine Complete  08/24/2011  *RADIOLOGY REPORT*  Clinical Data: Fall, low back pain.  LUMBAR SPINE - COMPLETE 4+ VIEW  Comparison: 07/05/2011  Findings: There is mild vertebral body height loss at T12, L1, and L4.  Underlying sclerotic changes at L1 and L4. No retropulsion. Multilevel degenerative changes.  No dislocation.  Advanced atherosclerotic calcification.  IMPRESSION: Mild vertebral body height loss at T12, L1, and L4.  Underlying sclerotic changes of L1 and L4, therefore pathologic fracture/metastatic disease not excluded.  Recommend MRI follow-up.  Original Report Authenticated By: Waneta Martins, M.D.   Ct Chest W Contrast  08/25/2011  *RADIOLOGY REPORT*  Clinical Data: Evaluate for lung mass.  Metastatic disease with unknown primary.  CT CHEST WITH CONTRAST  Technique:  Multidetector CT imaging of the chest was performed following the standard protocol during bolus administration of intravenous contrast.  Contrast: 80mL OMNIPAQUE IOHEXOL 300 MG/ML  SOLN  Comparison: Thoracic spine MRI 08/24/2011 and MRI lumbar spine 08/25/2011.  Chest radiograph 08/24/2011  Findings: Normal heart size.  Atherosclerotic calcification of the coronary arteries, and of the normal caliber thoracic aorta.  Trace right pleural effusion.  Negative for left pleural effusion or pericardial effusion.  Borderline prominent 9 mm prevascular lymph node on image number 24.  No additional borderline or enlarged mediastinal, hilar, or axillary lymph nodes.  Negative for supraclavicular lymphadenopathy.  Soft tissues of the breasts demonstrate mild bilateral gynecomastia.  No evidence of breast mass.  Esophagus is mildly distended with areas distal aspect, but otherwise unremarkable.   Normal appearance of the visualized portion of the thyroid gland.  Mild respiratory motion is slightly limits the evaluation of the lung parenchyma.  The trachea and mainstem bronchi are patent. Negative for emphysema.  There is no pulmonary mass or nodule. Negative for airspace disease.  There are multiple irregular bony lesions, with a mixed lytic and sclerotic pattern consistent with diffuse bony metastatic disease. These lesions include the ribs bilaterally, the visualized thoracic and visualized upper lumbar vertebral bodies.  The largest focal lesions involve T5, where there is focal destruction of the posterior cortex and soft tissue extension into the epidural space in the midline and to the left of midline, and L1 where there is diffuse sclerosis and lucency throughout the vertebral body, and an associated compression fracture deformity.  The most prominent areas of bony metastatic disease involving the ribs include the right third rib anteriorly where there is expansion of the cortex, and the left posterolateral fifth rib where there is a central lucent lesion, with associated pathologic fracture through the inferior cortex. Small sclerotic focus in the body of the sternum, for which a bony metastasis cannot  be excluded (image #57 of the sagittal reformats).  Imaging of the upper abdomen demonstrates mild bilateral adrenal gland hyperplasia, left greater than right, without a focal adrenal gland mass.  The imaged portion of the liver enhances normally shows no evidence of mass.  The spleen is normal.  The imaged portion of the pancreas is normal.  The stomach has normal appearances.  No upper abdominal lymphadenopathy is identified.  No skin lesions are seen.  IMPRESSION:  1.  Negative for pulmonary nodule or mass.  A primary lung malignancy to explain the patient's diffuse bony metastatic disease is not identified on this chest CT, and there is no evidence of pulmonary metastatic disease. 2.  Single upper  normal/mildly prominent prevascular lymph node (9 mm). 3.  Diffuse osseous metastatic disease involving the visualized vertebral bodies and multiple bilateral ribs, and possible small sternal metastasis.  There is destruction of the posterior cortex of T5 with extension of soft tissue density into the epidural space, suspicious for focal epidural spread of tumor.  There is extensive involvement of the L1 vertebral body with vertebral body compression fracture, as described on prior MRI. 4.  Pathologic fracture through a metastatic lesion in the left fifth rib.  Original Report Authenticated By: Britta Mccreedy, M.D.   Ct Lumbar Spine Wo Contrast  08/24/2011  *RADIOLOGY REPORT*  Clinical Data: Larey Seat.  Back pain.  CT LUMBAR SPINE WITHOUT CONTRAST  Technique:  Multidetector CT imaging of the lumbar spine was performed without intravenous contrast administration. Multiplanar CT image reconstructions were also generated.  Comparison: Radiography same day, 07/05/2011, 07/20/2008.  Findings: Lower portion of T12 is intact.  There is lytic destructive change of the left twelfth rib.  L1: Mixed lytic/sclerotic destructive change with loss of height of 25%.  No discernible intraspinal tumor.  L2:  Early lytic destruction of the posterior aspect of the vertebral body.  No loss of height.  No definable extraosseous tumor.  L3:  Early lytic destructive areas within the vertebral body.  No discernible extraosseous tumor or loss of height.  L4:  A mixed lytic/sclerotic destructive change with loss of height of 25%.  The tumor bulging into the ventral aspect of the spinal canal and involving the intervertebral foramen on the left.  L5:  Mixed lytic/sclerotic destructive change with loss of height of about 10%.  No apparent extraosseous tumor.  Sacrum and iliac bones:  Probable areas of mixed lytic and sclerotic tumor involvement.  No sign of extraosseous tumor.  IMPRESSION: Lytic and lytic/sclerotic lesions throughout the region of  the skeleton imaged.  Findings consistent with metastatic disease or less likely myeloma.  25% loss of height at L1 and L4 and minimal loss of height that L5.  Extraosseous tumor within the ventral spinal canal and the intervertebral foramen on the left at L4.  Critical Value/emergent results were called by telephone at the time of interpretation on 08/24/2011 at  0900 hours to physician's assistant Laveda Norman, who verbally acknowledged these results.  Original Report Authenticated By: Thomasenia Sales, M.D.   Mr Thoracic Spine Wo Contrast  08/24/2011  *RADIOLOGY REPORT*  Clinical Data: Emergency department patient with acute back pain. Evaluate for cord compression.  History of diabetes and spinal cord tumor.  Lumbar CT earlier today demonstrated probable osseous metastatic disease.  MRI THORACIC SPINE WITHOUT CONTRAST  Technique:  Multiplanar and multiecho pulse sequences of the thoracic spine were obtained without intravenous contrast.  Comparison: Lumbar spine CT 08/24/2011.  Findings: The patient was not able  to complete the examination due to claustrophobia and pain.  Only sagittal imaging was obtained.  There is widespread osseous metastatic disease to the cervical thoracic spine.  Involvement of the thoracic region is greatest at T1, T5 and T12.  There is involvement of the posterior elements at multiple levels.  However, no pathologic fracture or significant epidural tumor is apparent.  There is no foraminal compromise or definite cord deformity in the thoracic region. Cervical assessment is limited.  No abnormal cord signal is seen.  There are no obvious paraspinal abnormalities as imaged in the sagittal plane.  IMPRESSION:  1.  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 2.  No pathologic fracture, cord deformity or significant foraminal compromise identified in the thoracic spine.  Cervical assessment is limited.  The patient was not able to complete the examination.  No sagittal or  postcontrast imaging was obtained. Consider CT of the chest, abdomen and pelvis for further evaluation of the osseous metastatic disease and assessment for a possible primary lesion.  Original Report Authenticated By: Gerrianne Scale, M.D.   Mr Lumbar Spine Wo Contrast  08/25/2011  *RADIOLOGY REPORT*  Clinical Data: Eval for cord compromise from tumor.  MRI thoracic spine performed yesterday.  Bony metastatic disease.  MRI LUMBAR SPINE WITHOUT CONTRAST  Technique:  Multiplanar and multiecho pulse sequences of the lumbar spine were obtained without intravenous contrast.  Comparison: CT 08/24/2011.  Findings: The patient was able with complete sagittal T1 and T2- weighted imaging however axial imaging could not be performed due to intolerance of scanning.  The lumbar spine shows diffuse metastatic disease.  The metastatic infiltration is most pronounced at L1 and L4.  Every level is involved.  There is no definite cauda equina compression identified.  Bilateral foraminal stenosis is present at L4-L5 associated with extraosseous extension of tumor into both neural foramina, potentially affecting both L4 nerve roots, left greater than right.  L1 compression fracture appears similar to CT 08/24/2011.  IMPRESSION: 1.  The patient was only able to complete sagittal imaging T1 and T2 sequences.  There is no gross cauda equina compression. 2.  Diffuse metastatic disease, most prominent at L4-L5.  Although the central canal appears grossly patent, there is bilateral foraminal stenosis, left greater than right associated with extraosseous extension of tumor. 3.  L1 and L4 pathologic compression fractures appears similar to prior.  Original Report Authenticated By: Andreas Newport, M.D.    Scheduled Meds:    . aspirin EC  81 mg Oral Daily  . atorvastatin  20 mg Oral Daily  . dexamethasone  4 mg Oral Q6H  . enoxaparin (LOVENOX) injection  40 mg Subcutaneous Q24H  . glimepiride  4 mg Oral BID WC  . insulin aspart   0-15 Units Subcutaneous TID WC  . insulin aspart  4 Units Subcutaneous TID WC  . insulin glargine  40 Units Subcutaneous BID  . pantoprazole  40 mg Oral Q1200  . sodium chloride  3 mL Intravenous Q12H  . traZODone  50 mg Oral QHS   Continuous Infusions:   Time spent: > 20 minutes    Kier Smead  [facility] Pager [phone]. If 8PM-8AM, please contact night-coverage at [email or web address], password [facility] 09/14/2011, 11:24 AM  LOS: 4 days

## 2011-09-14 NOTE — Procedures (Signed)
CT guided biopsy of left iliac bone lesion.  2 cores and 3 FNAs obtained.  No immediate complication.

## 2011-09-15 ENCOUNTER — Ambulatory Visit
Admit: 2011-09-15 | Discharge: 2011-09-15 | Disposition: A | Discharge status: Home / Self Care | Attending: Radiation Oncology | Admitting: Radiation Oncology

## 2011-09-15 ENCOUNTER — Telehealth: Payer: Self-pay

## 2011-09-15 DIAGNOSIS — E785 Hyperlipidemia, unspecified: Secondary | ICD-10-CM

## 2011-09-15 LAB — GLUCOSE, CAPILLARY
Glucose-Capillary: 119 mg/dL — ABNORMAL HIGH (ref 70–99)
Glucose-Capillary: 255 mg/dL — ABNORMAL HIGH (ref 70–99)

## 2011-09-15 MED ORDER — DEXAMETHASONE 4 MG PO TABS: 4.0000 mg | ORAL_TABLET | Freq: Four times a day (QID) | ORAL | Status: AC

## 2011-09-15 MED ORDER — AMLODIPINE BESYLATE 5 MG PO TABS: 5.0000 mg | ORAL_TABLET | Freq: Every day | ORAL | Status: DC

## 2011-09-15 MED ORDER — OXYCODONE HCL 20 MG PO TB12
20.0000 mg | ORAL_TABLET | Freq: Two times a day (BID) | ORAL | Status: AC
Start: 2011-09-15 — End: ?

## 2011-09-15 MED ORDER — IPRATROPIUM-ALBUTEROL 20-100 MCG/ACT IN AERS: 1.0000 | INHALATION_SPRAY | Freq: Four times a day (QID) | RESPIRATORY_TRACT | Status: DC | PRN

## 2011-09-15 MED ORDER — HYDROMORPHONE HCL 4 MG PO TABS: 4.0000 mg | ORAL_TABLET | Freq: Four times a day (QID) | ORAL | Status: AC | PRN

## 2011-09-15 MED ORDER — INSULIN GLARGINE 100 UNIT/ML ~~LOC~~ SOLN: 50.0000 [IU] | Freq: Two times a day (BID) | SUBCUTANEOUS | Status: DC

## 2011-09-15 NOTE — Progress Notes (Signed)
Spoke with Lafonda Mosses with [facility]. Patient will be going home with their services. I made her aware of the need for transportation to and from radiation treatments and she stated she will work on finding transportation for him.

## 2011-09-15 NOTE — Progress Notes (Signed)
Per RNCM, Windell Moulding, Pt may need taxi voucher at d/c.  Spoke with Pt who stated that he is not in need of a taxi voucher, as his friend is on the way to pick him up.  No CSW needs identified.]  Providence Crosby, [place] Clinical Social Work [phone]

## 2011-09-15 NOTE — Discharge Summary (Signed)
Physician Discharge Summary  Bradley [name] UUV:[id number] DOB: 1945/05/12 DOA: 09/10/2011  PCP: Florentina Jenny, MD  Admit date: 09/10/2011 Discharge date: 09/15/2011  Recommendations for Outpatient Follow-up:  1. Follow up with PCP in 10 days (adressed tapering dose for decadron after acute treatment is completed; follow bone biopsy for final diagnosis and probable identification of primary cancer source, at that moment discussed with patient again tx options and decide if oncology will need to be involved into the case for further treatment or palliative/hospice will be only tx. Recheck and adjust medications for DM)  Discharge Diagnoses:  Principal Problem:  *Back pain Active Problems:  Hypertension  DM (diabetes mellitus)  Metastatic cancer to bone  Leukocytosis  HLD (hyperlipidemia)  GERD (gastroesophageal reflux disease)  Alkaline phosphatase elevation  Dry eyes, bilateral   Discharge Condition: Patient discharge in stable and improved condition; reports still some mild to moderate pain on his back but improved overall. Will go home with hospice and Pain medications. Follow up with PCP in 10 days. Hospice will arrange transportation for radiation therapy as an outpatient.  Diet recommendation: Low carb diet  Filed Weights   09/11/11 1613  Weight: 84.097 kg (185 lb 6.4 oz)    History of present illness:    Hospital Course:   1-Back pain  -Secondary to metastatic disease; at this point unclear source. But preliminary report demonstrating carcinoma - Patient has now agree to radiation therapy and will continue treatment as an outpatient. -Will continue decadron for 10 days and will then tapered off -Continue pain meds and supportive care.  -Hospice services arranged and ongoing.  2-Metastatic cancer to bone  -will follow final biopsy results and depending diagnosis and future patient decision of treatment involved oncology as needed (Dr. Cyndie Chime and determine if any  further treatment can be offered).   -radiation therapy, pain meds and decadron as mentioned above.   3-Hypertension  - Continue norvasc.  4-DM (diabetes mellitus)  - uncontrolled and with complications, gastroparesis and neuropathy . - continue lantus dose readjust at 55 units BId, especially with ongoing steroids use. Patient advised to follow low carb diet (but doubt compliance); follow up with PCP for further adjustments.  - A1C 9.0   5-Increase in Alkaline Phosphatase  - likely secondary to progressive bone metastasis  - now up from the last value 2 weeks ago, 400's --> 600's   6-Leukocytosis  - likely reactive especially with recent steroids use.  -patient afebrile and w/o dysuria.  -No antibiotics given.   7-GERD  - continue Protonix   8-Dry eyes:  -Chronic and bilaterally. Will continue artifical tears to provide symptoms relief.    Procedures:  Left illiac bone biopsy  Radiation therapy   Consultations:  Oncology  Radiation oncology  IR  Discharge Exam: Filed Vitals:   09/15/11 1452  BP: 100/87  Pulse: 98  Temp: 98.8 F (37.1 C)  Resp: 20   Filed Vitals:   09/14/11 1500 09/14/11 2132 09/15/11 0556 09/15/11 1452  BP: 136/62 153/73 115/96 100/87  Pulse: 85 91 83 98  Temp: 98.2 F (36.8 C) 98.3 F (36.8 C) 98.7 F (37.1 C) 98.8 F (37.1 C)  TempSrc: Oral Oral Oral Oral  Resp: 18 19 19 20   Height:      Weight:      SpO2: 99% 100% 98% 100%    General: NAD; mild to moderate back pain but well controlled on current pain regimen. Cardiovascular: S1 and S2; no rubs or gallops Respiratory: No wheezing;  scattered rhonchi present Abdomen: soft, NT, no distended; positive BS Extremities: trace edema bilaterally; Right foot with metatarsal amputations. Neurology: AAOX3, no CN deficits ; no motor deficit.  Discharge Instructions  Discharge Orders    Future Appointments: Provider: Department: Dept Phone: Center:   09/16/2011 2:00 PM [facility]  [id number] [facility] [phone] None   09/17/2011 2:00 PM [facility] [id number] [facility] [phone] None   09/18/2011 2:00 PM [facility] [id number] [facility] [phone] None     Future Orders Please Complete By Expires   Increase activity slowly      Discharge instructions      Comments:   -Continue your radiation treatments as indicated -Take medications as prescribed -Arrange visit with PCP in 10 days -Keep yourself well hydrated     Medication List  As of 09/15/2011  3:20 PM   STOP taking these medications         albuterol 108 (90 BASE) MCG/ACT inhaler         TAKE these medications         amLODipine 5 MG tablet   Commonly known as: NORVASC   Take 1 tablet (5 mg total) by mouth daily.      aspirin 81 MG tablet   Take 81 mg by mouth daily.      atorvastatin 20 MG tablet   Commonly known as: LIPITOR   Take 20 mg by mouth daily.      dexamethasone 4 MG tablet   Commonly known as: DECADRON   Take 1 tablet (4 mg total) by mouth every 6 (six) hours.      glimepiride 4 MG tablet   Commonly known as: AMARYL   Take 4 mg by mouth 2 (two) times daily.      HYDROmorphone 4 MG tablet   Commonly known as: DILAUDID   Take 1 tablet (4 mg total) by mouth every 6 (six) hours as needed for pain.      insulin glargine 100 UNIT/ML injection   Commonly known as: LANTUS   Inject 50 Units into the skin 2 (two) times daily.      Ipratropium-Albuterol 20-100 MCG/ACT Aers respimat   Commonly known as: COMBIVENT   Inhale 1 puff into the lungs every 6 (six) hours as needed for wheezing or shortness of breath.      omeprazole 20 MG capsule   Commonly known as: PRILOSEC   Take 1 capsule (20 mg total) by mouth daily.      oxyCODONE 20 MG 12 hr tablet   Commonly known as: OXYCONTIN   Take 1 tablet (20 mg total) by mouth every 12 (twelve) hours.      traMADol 50 MG tablet   Commonly known as: ULTRAM   Take 50 mg by mouth every 6 (six) hours as needed.  For pain      traZODone 50 MG tablet   Commonly known as: DESYREL   Take 50 mg by mouth at bedtime.      vitamin B-12 1000 MCG tablet   Commonly known as: CYANOCOBALAMIN   Take 1,000 mcg by mouth daily.           Follow-up Information    Follow up with Florentina Jenny, MD. Schedule an appointment as soon as possible for a visit in 10 days.   Contact information:   [address] Laurell Josephs. [address] [place] [address] [phone]           The results of significant diagnostics from this hospitalization (including  imaging, microbiology, ancillary and laboratory) are listed below for reference.    Significant Diagnostic Studies: Dg Chest 2 View  08/24/2011  *RADIOLOGY REPORT*  Clinical Data: Fall, shortness of breath  CHEST - 2 VIEW  Comparison: 11/29/2008  Findings: Lungs are clear. No pleural effusion or pneumothorax.  Mild cardiomegaly.  Degenerative changes of the visualized thoracolumbar spine.  IMPRESSION: No evidence of acute cardiopulmonary disease.  Original Report Authenticated By: Charline Bills, M.D.   Dg Lumbar Spine Complete  08/24/2011  *RADIOLOGY REPORT*  Clinical Data: Fall, low back pain.  LUMBAR SPINE - COMPLETE 4+ VIEW  Comparison: 07/05/2011  Findings: There is mild vertebral body height loss at T12, L1, and L4.  Underlying sclerotic changes at L1 and L4. No retropulsion. Multilevel degenerative changes.  No dislocation.  Advanced atherosclerotic calcification.  IMPRESSION: Mild vertebral body height loss at T12, L1, and L4.  Underlying sclerotic changes of L1 and L4, therefore pathologic fracture/metastatic disease not excluded.  Recommend MRI follow-up.  Original Report Authenticated By: Waneta Martins, M.D.   Ct Chest W Contrast  08/25/2011  *RADIOLOGY REPORT*  Clinical Data: Evaluate for lung mass.  Metastatic disease with unknown primary.  CT CHEST WITH CONTRAST  Technique:  Multidetector CT imaging of the chest was performed following the  standard protocol during bolus administration of intravenous contrast.  Contrast: 80mL OMNIPAQUE IOHEXOL 300 MG/ML  SOLN  Comparison: Thoracic spine MRI 08/24/2011 and MRI lumbar spine 08/25/2011.  Chest radiograph 08/24/2011  Findings: Normal heart size.  Atherosclerotic calcification of the coronary arteries, and of the normal caliber thoracic aorta.  Trace right pleural effusion.  Negative for left pleural effusion or pericardial effusion.  Borderline prominent 9 mm prevascular lymph node on image number 24.  No additional borderline or enlarged mediastinal, hilar, or axillary lymph nodes.  Negative for supraclavicular lymphadenopathy.  Soft tissues of the breasts demonstrate mild bilateral gynecomastia.  No evidence of breast mass.  Esophagus is mildly distended with areas distal aspect, but otherwise unremarkable.  Normal appearance of the visualized portion of the thyroid gland.  Mild respiratory motion is slightly limits the evaluation of the lung parenchyma.  The trachea and mainstem bronchi are patent. Negative for emphysema.  There is no pulmonary mass or nodule. Negative for airspace disease.  There are multiple irregular bony lesions, with a mixed lytic and sclerotic pattern consistent with diffuse bony metastatic disease. These lesions include the ribs bilaterally, the visualized thoracic and visualized upper lumbar vertebral bodies.  The largest focal lesions involve T5, where there is focal destruction of the posterior cortex and soft tissue extension into the epidural space in the midline and to the left of midline, and L1 where there is diffuse sclerosis and lucency throughout the vertebral body, and an associated compression fracture deformity.  The most prominent areas of bony metastatic disease involving the ribs include the right third rib anteriorly where there is expansion of the cortex, and the left posterolateral fifth rib where there is a central lucent lesion, with associated pathologic  fracture through the inferior cortex. Small sclerotic focus in the body of the sternum, for which a bony metastasis cannot be excluded (image #57 of the sagittal reformats).  Imaging of the upper abdomen demonstrates mild bilateral adrenal gland hyperplasia, left greater than right, without a focal adrenal gland mass.  The imaged portion of the liver enhances normally shows no evidence of mass.  The spleen is normal.  The imaged portion of the pancreas is normal.  The stomach  has normal appearances.  No upper abdominal lymphadenopathy is identified.  No skin lesions are seen.  IMPRESSION:  1.  Negative for pulmonary nodule or mass.  A primary lung malignancy to explain the patient's diffuse bony metastatic disease is not identified on this chest CT, and there is no evidence of pulmonary metastatic disease. 2.  Single upper normal/mildly prominent prevascular lymph node (9 mm). 3.  Diffuse osseous metastatic disease involving the visualized vertebral bodies and multiple bilateral ribs, and possible small sternal metastasis.  There is destruction of the posterior cortex of T5 with extension of soft tissue density into the epidural space, suspicious for focal epidural spread of tumor.  There is extensive involvement of the L1 vertebral body with vertebral body compression fracture, as described on prior MRI. 4.  Pathologic fracture through a metastatic lesion in the left fifth rib.  Original Report Authenticated By: Britta Mccreedy, M.D.   Ct Lumbar Spine Wo Contrast  08/24/2011  *RADIOLOGY REPORT*  Clinical Data: Larey Seat.  Back pain.  CT LUMBAR SPINE WITHOUT CONTRAST  Technique:  Multidetector CT imaging of the lumbar spine was performed without intravenous contrast administration. Multiplanar CT image reconstructions were also generated.  Comparison: Radiography same day, 07/05/2011, 07/20/2008.  Findings: Lower portion of T12 is intact.  There is lytic destructive change of the left twelfth rib.  L1: Mixed  lytic/sclerotic destructive change with loss of height of 25%.  No discernible intraspinal tumor.  L2:  Early lytic destruction of the posterior aspect of the vertebral body.  No loss of height.  No definable extraosseous tumor.  L3:  Early lytic destructive areas within the vertebral body.  No discernible extraosseous tumor or loss of height.  L4:  A mixed lytic/sclerotic destructive change with loss of height of 25%.  The tumor bulging into the ventral aspect of the spinal canal and involving the intervertebral foramen on the left.  L5:  Mixed lytic/sclerotic destructive change with loss of height of about 10%.  No apparent extraosseous tumor.  Sacrum and iliac bones:  Probable areas of mixed lytic and sclerotic tumor involvement.  No sign of extraosseous tumor.  IMPRESSION: Lytic and lytic/sclerotic lesions throughout the region of the skeleton imaged.  Findings consistent with metastatic disease or less likely myeloma.  25% loss of height at L1 and L4 and minimal loss of height that L5.  Extraosseous tumor within the ventral spinal canal and the intervertebral foramen on the left at L4.  Critical Value/emergent results were called by telephone at the time of interpretation on 08/24/2011 at  0900 hours to physician's assistant Laveda Norman, who verbally acknowledged these results.  Original Report Authenticated By: Thomasenia Sales, M.D.   Mr Thoracic Spine Wo Contrast  08/24/2011  *RADIOLOGY REPORT*  Clinical Data: Emergency department patient with acute back pain. Evaluate for cord compression.  History of diabetes and spinal cord tumor.  Lumbar CT earlier today demonstrated probable osseous metastatic disease.  MRI THORACIC SPINE WITHOUT CONTRAST  Technique:  Multiplanar and multiecho pulse sequences of the thoracic spine were obtained without intravenous contrast.  Comparison: Lumbar spine CT 08/24/2011.  Findings: The patient was not able to complete the examination due to claustrophobia and pain.  Only sagittal  imaging was obtained.  There is widespread osseous metastatic disease to the cervical thoracic spine.  Involvement of the thoracic region is greatest at T1, T5 and T12.  There is involvement of the posterior elements at multiple levels.  However, no pathologic fracture or significant epidural tumor is  apparent.  There is no foraminal compromise or definite cord deformity in the thoracic region. Cervical assessment is limited.  No abnormal cord signal is seen.  There are no obvious paraspinal abnormalities as imaged in the sagittal plane.  IMPRESSION:  1.  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 2.  No pathologic fracture, cord deformity or significant foraminal compromise identified in the thoracic spine.  Cervical assessment is limited.  The patient was not able to complete the examination.  No sagittal or postcontrast imaging was obtained. Consider CT of the chest, abdomen and pelvis for further evaluation of the osseous metastatic disease and assessment for a possible primary lesion.  Original Report Authenticated By: Gerrianne Scale, M.D.   Mr Lumbar Spine Wo Contrast  08/25/2011  *RADIOLOGY REPORT*  Clinical Data: Eval for cord compromise from tumor.  MRI thoracic spine performed yesterday.  Bony metastatic disease.  MRI LUMBAR SPINE WITHOUT CONTRAST  Technique:  Multiplanar and multiecho pulse sequences of the lumbar spine were obtained without intravenous contrast.  Comparison: CT 08/24/2011.  Findings: The patient was able with complete sagittal T1 and T2- weighted imaging however axial imaging could not be performed due to intolerance of scanning.  The lumbar spine shows diffuse metastatic disease.  The metastatic infiltration is most pronounced at L1 and L4.  Every level is involved.  There is no definite cauda equina compression identified.  Bilateral foraminal stenosis is present at L4-L5 associated with extraosseous extension of tumor into both neural foramina,  potentially affecting both L4 nerve roots, left greater than right.  L1 compression fracture appears similar to CT 08/24/2011.  IMPRESSION: 1.  The patient was only able to complete sagittal imaging T1 and T2 sequences.  There is no gross cauda equina compression. 2.  Diffuse metastatic disease, most prominent at L4-L5.  Although the central canal appears grossly patent, there is bilateral foraminal stenosis, left greater than right associated with extraosseous extension of tumor. 3.  L1 and L4 pathologic compression fractures appears similar to prior.  Original Report Authenticated By: Andreas Newport, M.D.   Ct Biopsy  09/14/2011  *RADIOLOGY REPORT*  Clinical history:Multiple bone lesions without a primary diagnosis.  PROCEDURE(S): CT GUIDED BIOPSY OF THE LEFT ILIAC BONE LESION.  Physician: Rachelle Hora. Henn, MD  Medications:Versed 2 mg, Fentanyl 200 mcg. A radiology nurse monitored the patient for moderate sedation.  Moderate sedation time:30 minutes  Procedure:The procedure was explained to the patient.  The risks and benefits of the procedure were discussed and the patient's questions were addressed.  Informed consent was obtained from the patient.  The patient was placed in a right lateral decubitus position.  Images of the pelvis were obtained.  A lucent lesion involving the posterior left iliac bone was targeted.  The back was prepped and draped in a sterile fashion.  Skin and posterior bone cortex were anesthetized with lidocaine.  11 gauge bone needle was directed in the left iliac bone with CT guidance.  Three aspirates were obtained.  Two core biopsies were performed with the 11 gauge needle.  Findings:The bone are very heterogeneous and concerning for diffuse metastatic disease.   Lesion in posterior left iliac bone was targeted.  Complications: None  Impression:CT guided aspirations and core biopsies of the posterior left iliac bone lesion.   Original Report Authenticated By: Richarda Overlie, M.D. ( 09/14/2011  11:28:14 )    Labs: Basic Metabolic Panel:  Lab 09/11/11 1610 09/10/11 1745 09/10/11 1743  NA 136 -- 135  K 4.5 -- 4.1  CL 100 -- 99  CO2 28 -- 26  GLUCOSE 54* -- 201*  BUN 18 -- 20  CREATININE 0.73 -- 0.65  CALCIUM 8.7 -- 8.3*  MG -- 2.0 --  PHOS -- 3.5 --   Liver Function Tests:  Lab 09/10/11 1743  AST 18  ALT 29  ALKPHOS 675*  BILITOT 0.2*  PROT 5.7*  ALBUMIN 2.7*   CBC:  Lab 09/11/11 0514 09/10/11 1743  WBC 13.2* 13.2*  NEUTROABS -- 9.5*  HGB 12.2* 11.7*  HCT 36.4* 34.3*  MCV 86.3 84.7  PLT 168 160   CBG:  Lab 09/15/11 1125 09/15/11 0722 09/14/11 2158 09/14/11 1741 09/14/11 1540  GLUCAP 255* 119* 334* 194* 251*    Time coordinating discharge: >30 minutes  Signed:  Kapena Hamme  [facility] 09/15/2011, 3:20 PM

## 2011-09-15 NOTE — Telephone Encounter (Signed)
Spoke with inpatient nurse to premedicate patient at 1:30 pm prior to radiation.patient for tentative discharge after radiation treatment.Heather RT will give calendar for outpatient treatment time.

## 2011-09-15 NOTE — Progress Notes (Signed)
Spoke with Providence Crosby, CSW and Amada Kingfisher regarding outpatient transportation to his radiation appointments.  Both state they do not make these arrangements.  Phoned Val in radiation oncology for assistance, left message.

## 2011-09-16 ENCOUNTER — Ambulatory Visit
Admit: 2011-09-16 | Discharge: 2011-09-16 | Disposition: A | Discharge status: Home / Self Care | Attending: Radiation Oncology | Admitting: Radiation Oncology

## 2011-09-16 ENCOUNTER — Encounter (HOSPITAL_COMMUNITY): Payer: Self-pay | Admitting: Vascular Surgery

## 2011-09-17 ENCOUNTER — Ambulatory Visit
Admit: 2011-09-17 | Discharge: 2011-09-17 | Disposition: A | Discharge status: Home / Self Care | Attending: Radiation Oncology | Admitting: Radiation Oncology

## 2011-09-18 ENCOUNTER — Ambulatory Visit

## 2011-09-18 ENCOUNTER — Telehealth: Payer: Self-pay

## 2011-09-18 NOTE — Telephone Encounter (Signed)
Called patient forgot appointment had been changed to 11:30 am rather than 1:30 pm.will inform therapist on truebeam 2.

## 2011-09-21 ENCOUNTER — Ambulatory Visit

## 2011-09-21 ENCOUNTER — Encounter: Payer: Self-pay | Admitting: Radiation Oncology

## 2011-09-21 ENCOUNTER — Ambulatory Visit
Admission: RE | Admit: 2011-09-21 | Discharge: 2011-09-21 | Disposition: A | Discharge status: Home / Self Care | Source: Ambulatory Visit | Attending: Radiation Oncology | Admitting: Radiation Oncology

## 2011-09-21 DIAGNOSIS — M549 Dorsalgia, unspecified: Secondary | ICD-10-CM

## 2011-09-21 NOTE — Progress Notes (Signed)
  Radiation Oncology         [phone] ________________________________  Name: Bradley [name] MRN: [id number]  Date: 09/21/2011  DOB: 05-26-1945  Weekly Radiation Therapy Management  Current Dose: 16 Gy     Planned Dose:  20 Gy  Narrative . . . . . . . . The patient presents for routine under treatment assessment.                                              The patient is without complaint.  His back pain is better.                                 Set-up films were reviewed.                                 The chart was checked. Physical Findings. . . Weight essentially stable.  No significant changes. Impression . . . . . . . The patient is  tolerating radiation. Plan . . . . . . . . . . . . Continue treatment as planned.  ________________________________  Bradley [name]. Kathrynn Running, M.D.

## 2011-09-21 NOTE — Progress Notes (Signed)
Patient uncooperative with answering questions.Keeps stating I got to get out of here."I want to go home."Dr.Manning has been paged to see patient.

## 2011-09-22 ENCOUNTER — Encounter: Payer: Self-pay | Admitting: Radiation Oncology

## 2011-09-22 ENCOUNTER — Ambulatory Visit
Admission: RE | Admit: 2011-09-22 | Discharge: 2011-09-22 | Disposition: A | Discharge status: Home / Self Care | Source: Ambulatory Visit | Attending: Radiation Oncology | Admitting: Radiation Oncology

## 2011-09-22 ENCOUNTER — Ambulatory Visit

## 2011-09-23 ENCOUNTER — Ambulatory Visit

## 2011-09-24 ENCOUNTER — Ambulatory Visit: Admitting: Radiation Oncology

## 2011-09-24 ENCOUNTER — Ambulatory Visit

## 2011-09-24 NOTE — Progress Notes (Signed)
°  Radiation Oncology         [phone] ________________________________  Name: Bradley [name] MRN: [id number]  Date: 09/22/2011  DOB: 1945-05-28  End of Treatment Note  Diagnosis:   66 year old gentleman with widespread bone lesions consistent with metastatic adenocarcinoma and painful involvement of the T5 level   Indication for treatment:  Palliation       Radiation treatment dates:  09/15/2011-09/22/2011  Site/dose:    The vertebral bodies from T4-T6 were inclusively covered with 20 gray in 5 fractions of 4 gray  Beams/energy:   Anterior and posterior rectangular fields were used to deliver 6 megavolt photons from the front and 10 megavolt photons from the posterior with increased waiting from the posterior field in order to reduce radiation exposure to the heart and anterior mediastinum.  Narrative: The patient tolerated radiation treatment relatively well.   The patient completed treatment without interruption.  Plan: The patient has completed radiation treatment. The patient will return to radiation oncology clinic for routine followup in one month. I advised them to call or return sooner if they have any questions or concerns related to their recovery or treatment. ________________________________  Artist Pais. Kathrynn Running, M.D.

## 2011-09-25 ENCOUNTER — Ambulatory Visit

## 2011-09-29 ENCOUNTER — Ambulatory Visit

## 2011-09-30 ENCOUNTER — Ambulatory Visit

## 2011-10-01 ENCOUNTER — Ambulatory Visit

## 2011-10-02 ENCOUNTER — Ambulatory Visit

## 2011-10-05 ENCOUNTER — Encounter (HOSPITAL_COMMUNITY): Payer: Self-pay | Admitting: Emergency Medicine

## 2011-10-05 ENCOUNTER — Inpatient Hospital Stay (HOSPITAL_COMMUNITY)
Admission: EM | Admit: 2011-10-05 | Discharge: 2011-10-09 | DRG: 603 | Disposition: A | Discharge status: Hospice | Payer: Medicare Other | Attending: Internal Medicine | Admitting: Internal Medicine

## 2011-10-05 ENCOUNTER — Ambulatory Visit

## 2011-10-05 DIAGNOSIS — Z880 Allergy status to penicillin: Secondary | ICD-10-CM

## 2011-10-05 DIAGNOSIS — R29898 Other symptoms and signs involving the musculoskeletal system: Secondary | ICD-10-CM

## 2011-10-05 DIAGNOSIS — F17200 Nicotine dependence, unspecified, uncomplicated: Secondary | ICD-10-CM | POA: Diagnosis present

## 2011-10-05 DIAGNOSIS — D72829 Elevated white blood cell count, unspecified: Secondary | ICD-10-CM

## 2011-10-05 DIAGNOSIS — W19XXXA Unspecified fall, initial encounter: Secondary | ICD-10-CM

## 2011-10-05 DIAGNOSIS — I10 Essential (primary) hypertension: Secondary | ICD-10-CM | POA: Diagnosis present

## 2011-10-05 DIAGNOSIS — L0390 Cellulitis, unspecified: Secondary | ICD-10-CM

## 2011-10-05 DIAGNOSIS — E785 Hyperlipidemia, unspecified: Secondary | ICD-10-CM

## 2011-10-05 DIAGNOSIS — L03119 Cellulitis of unspecified part of limb: Principal | ICD-10-CM | POA: Diagnosis present

## 2011-10-05 DIAGNOSIS — S88119A Complete traumatic amputation at level between knee and ankle, unspecified lower leg, initial encounter: Secondary | ICD-10-CM

## 2011-10-05 DIAGNOSIS — E669 Obesity, unspecified: Secondary | ICD-10-CM | POA: Diagnosis present

## 2011-10-05 DIAGNOSIS — L03116 Cellulitis of left lower limb: Secondary | ICD-10-CM | POA: Diagnosis present

## 2011-10-05 DIAGNOSIS — R748 Abnormal levels of other serum enzymes: Secondary | ICD-10-CM

## 2011-10-05 DIAGNOSIS — L02419 Cutaneous abscess of limb, unspecified: Principal | ICD-10-CM | POA: Diagnosis present

## 2011-10-05 DIAGNOSIS — G8310 Monoplegia of lower limb affecting unspecified side: Secondary | ICD-10-CM | POA: Diagnosis present

## 2011-10-05 DIAGNOSIS — M545 Low back pain: Secondary | ICD-10-CM

## 2011-10-05 DIAGNOSIS — Z96649 Presence of unspecified artificial hip joint: Secondary | ICD-10-CM

## 2011-10-05 DIAGNOSIS — M8448XA Pathological fracture, other site, initial encounter for fracture: Secondary | ICD-10-CM | POA: Diagnosis present

## 2011-10-05 DIAGNOSIS — E119 Type 2 diabetes mellitus without complications: Secondary | ICD-10-CM | POA: Diagnosis present

## 2011-10-05 DIAGNOSIS — Z720 Tobacco use: Secondary | ICD-10-CM

## 2011-10-05 DIAGNOSIS — H04123 Dry eye syndrome of bilateral lacrimal glands: Secondary | ICD-10-CM

## 2011-10-05 DIAGNOSIS — C801 Malignant (primary) neoplasm, unspecified: Secondary | ICD-10-CM | POA: Diagnosis present

## 2011-10-05 DIAGNOSIS — F1010 Alcohol abuse, uncomplicated: Secondary | ICD-10-CM | POA: Diagnosis present

## 2011-10-05 DIAGNOSIS — Z79899 Other long term (current) drug therapy: Secondary | ICD-10-CM

## 2011-10-05 DIAGNOSIS — M549 Dorsalgia, unspecified: Secondary | ICD-10-CM

## 2011-10-05 DIAGNOSIS — C7951 Secondary malignant neoplasm of bone: Secondary | ICD-10-CM | POA: Diagnosis present

## 2011-10-05 DIAGNOSIS — G893 Neoplasm related pain (acute) (chronic): Secondary | ICD-10-CM | POA: Diagnosis present

## 2011-10-05 DIAGNOSIS — Z6827 Body mass index (BMI) 27.0-27.9, adult: Secondary | ICD-10-CM

## 2011-10-05 DIAGNOSIS — Z794 Long term (current) use of insulin: Secondary | ICD-10-CM

## 2011-10-05 DIAGNOSIS — E1169 Type 2 diabetes mellitus with other specified complication: Secondary | ICD-10-CM | POA: Diagnosis present

## 2011-10-05 DIAGNOSIS — Z66 Do not resuscitate: Secondary | ICD-10-CM | POA: Diagnosis present

## 2011-10-05 DIAGNOSIS — K219 Gastro-esophageal reflux disease without esophagitis: Secondary | ICD-10-CM | POA: Diagnosis present

## 2011-10-05 DIAGNOSIS — H538 Other visual disturbances: Secondary | ICD-10-CM | POA: Diagnosis present

## 2011-10-05 LAB — CBC WITH DIFFERENTIAL/PLATELET
Basophils Relative: 0 % (ref 0–1)
Eosinophils Absolute: 0.1 10*3/uL (ref 0.0–0.7)
Hemoglobin: 13.7 g/dL (ref 13.0–17.0)
Lymphs Abs: 0.7 10*3/uL (ref 0.7–4.0)
MCH: 28.8 pg (ref 26.0–34.0)
Monocytes Relative: 6 % (ref 3–12)
Neutro Abs: 6.2 10*3/uL (ref 1.7–7.7)
Neutrophils Relative %: 83 % — ABNORMAL HIGH (ref 43–77)
Platelets: 124 10*3/uL — ABNORMAL LOW (ref 150–400)
RBC: 4.76 MIL/uL (ref 4.22–5.81)

## 2011-10-05 LAB — COMPREHENSIVE METABOLIC PANEL
AST: 15 U/L (ref 0–37)
BUN: 17 mg/dL (ref 6–23)
Calcium: 9.6 mg/dL (ref 8.4–10.5)
Chloride: 89 mEq/L — ABNORMAL LOW (ref 96–112)
GFR calc Af Amer: >90 mL/min (ref >90)
GFR calc non Af Amer: >90 mL/min (ref >90)
Glucose, Bld: 213 mg/dL — ABNORMAL HIGH (ref 70–99)
Potassium: 3.1 mEq/L — ABNORMAL LOW (ref 3.5–5.1)
Sodium: 132 mEq/L — ABNORMAL LOW (ref 135–145)

## 2011-10-05 MED ORDER — VANCOMYCIN HCL IN DEXTROSE 1-5 GM/200ML-% IV SOLN
1000.0000 mg | Freq: Once | INTRAVENOUS | Status: AC
  Administered 2011-10-05: 1000 mg via INTRAVENOUS
  Filled 2011-10-05: qty 200

## 2011-10-05 NOTE — ED Notes (Signed)
Per EMS- patient is from home. patient c/o leg pain and swelling to both legs x 3 weeks. Patient smells of urine. EMS reports that the bandages that were applied to bil legs today by home health nurse are wet from fluid leaking from wounds.

## 2011-10-05 NOTE — ED Provider Notes (Signed)
History     CSN: [id number]  Arrival date & time 10/05/11  9147   First MD Initiated Contact with Patient 10/05/11 1957      Chief Complaint  Patient presents with  . Leg Swelling  . Leg Pain    (Consider location/radiation/quality/duration/timing/severity/associated sxs/prior treatment) Patient is a 66 y.o. male presenting with rash. The history is provided by the patient (pt complains of leg pain). No language interpreter was used.  Rash  This is a new problem. The current episode started more than 2 days ago. The problem has not changed since onset.The problem is associated with nothing. There has been no fever. Affected Location: left lower leg. The pain is at a severity of 5/10. The pain is moderate. The pain has been constant since onset. Associated symptoms include blisters. He has tried nothing for the symptoms. The treatment provided no relief.    Past Medical History  Diagnosis Date  . Hypertension   . Diabetes mellitus   . Back pain   . Cancer   . GERD (gastroesophageal reflux disease)     Past Surgical History  Procedure Date  . Total hip arthroplasty   . Toe resections     No family history on file.  History  Substance Use Topics  . Smoking status: Current Some Day Smoker -- 0.5 packs/day for 49 years    Types: Cigarettes  . Smokeless tobacco: Never Used  . Alcohol Use: Yes     occassionally      Review of Systems  Constitutional: Negative for fatigue.  HENT: Negative for congestion, sinus pressure and ear discharge.   Eyes: Negative for discharge.  Respiratory: Negative for cough.   Cardiovascular: Negative for chest pain.  Gastrointestinal: Negative for abdominal pain and diarrhea.  Genitourinary: Negative for frequency and hematuria.  Musculoskeletal: Negative for back pain.  Skin: Positive for rash.  Neurological: Negative for seizures and headaches.  Hematological: Negative.   Psychiatric/Behavioral: Negative for hallucinations.     Allergies  Penicillins  Home Medications   Current Outpatient Rx  Name Route Sig Dispense Refill  . AMLODIPINE BESYLATE 5 MG PO TABS Oral Take 1 tablet (5 mg total) by mouth daily. 30 tablet 0  . ASPIRIN 81 MG PO TABS Oral Take 81 mg by mouth daily.    . ATORVASTATIN CALCIUM 20 MG PO TABS Oral Take 20 mg by mouth daily.    Marland Kitchen GLIMEPIRIDE 4 MG PO TABS Oral Take 4 mg by mouth 2 (two) times daily.    . INSULIN GLARGINE 100 UNIT/ML [place] SOLN Subcutaneous Inject 50 Units into the skin 2 (two) times daily. 10 mL 0  . IPRATROPIUM-ALBUTEROL 20-100 MCG/ACT IN AERS Inhalation Inhale 1 puff into the lungs every 6 (six) hours as needed for wheezing or shortness of breath. 1 Inhaler 1  . OMEPRAZOLE 20 MG PO CPDR Oral Take 1 capsule (20 mg total) by mouth daily. 30 capsule 0  . OXYCODONE HCL ER 20 MG PO TB12 Oral Take 1 tablet (20 mg total) by mouth every 12 (twelve) hours. 60 tablet 0  . TRAMADOL HCL 50 MG PO TABS Oral Take 50 mg by mouth every 6 (six) hours as needed. For pain    . TRAZODONE HCL 50 MG PO TABS Oral Take 50 mg by mouth at bedtime.    Marland Kitchen VITAMIN B-12 1000 MCG PO TABS Oral Take 1,000 mcg by mouth daily.      BP 131/59  Pulse 98  Temp 98.3 F (36.8 C) (  Oral)  Resp 15  SpO2 96%  Physical Exam  Constitutional: He is oriented to person, place, and time. He appears well-developed.  HENT:  Head: Normocephalic and atraumatic.  Eyes: Conjunctivae and EOM are normal. No scleral icterus.  Neck: Neck supple. No thyromegaly present.  Cardiovascular: Normal rate and regular rhythm.  Exam reveals no gallop and no friction rub.   No murmur heard. Pulmonary/Chest: No stridor. He has no wheezes. He has no rales. He exhibits no tenderness.  Abdominal: He exhibits no distension. There is no tenderness. There is no rebound.  Musculoskeletal: Normal range of motion.       Cellulitis left foot and ankle.  Right foot partial ambutaion  Lymphadenopathy:    He has no cervical adenopathy.   Neurological: He is oriented to person, place, and time. Coordination normal.  Skin: No rash noted. No erythema.  Psychiatric: He has a normal mood and affect. His behavior is normal.    ED Course  Procedures (including critical care time)  Labs Reviewed  CBC WITH DIFFERENTIAL - Abnormal; Notable for the following:    Platelets 124 (*)     Neutrophils Relative 83 (*)     Lymphocytes Relative 10 (*)     All other components within normal limits  COMPREHENSIVE METABOLIC PANEL - Abnormal; Notable for the following:    Sodium 132 (*)     Potassium 3.1 (*)     Chloride 89 (*)     CO2 37 (*)     Glucose, Bld 213 (*)     Albumin 2.6 (*)     Alkaline Phosphatase 642 (*)     All other components within normal limits   No results found.   1. Cellulitis       MDM          Benny Lennert, MD 10/05/11 [phone]

## 2011-10-05 NOTE — ED Notes (Signed)
Pt presenting to ed with c/o left leg with swelling and pain pt with bandage noted to left. Pt states he was told to present to ed by his pcp.

## 2011-10-06 ENCOUNTER — Encounter (HOSPITAL_COMMUNITY): Payer: Self-pay | Admitting: Internal Medicine

## 2011-10-06 ENCOUNTER — Inpatient Hospital Stay (HOSPITAL_COMMUNITY): Payer: Medicare Other

## 2011-10-06 DIAGNOSIS — C801 Malignant (primary) neoplasm, unspecified: Secondary | ICD-10-CM

## 2011-10-06 DIAGNOSIS — E119 Type 2 diabetes mellitus without complications: Secondary | ICD-10-CM

## 2011-10-06 DIAGNOSIS — L0291 Cutaneous abscess, unspecified: Secondary | ICD-10-CM

## 2011-10-06 DIAGNOSIS — L03116 Cellulitis of left lower limb: Secondary | ICD-10-CM | POA: Diagnosis present

## 2011-10-06 DIAGNOSIS — L0390 Cellulitis, unspecified: Secondary | ICD-10-CM

## 2011-10-06 DIAGNOSIS — C7951 Secondary malignant neoplasm of bone: Secondary | ICD-10-CM

## 2011-10-06 DIAGNOSIS — M79609 Pain in unspecified limb: Secondary | ICD-10-CM

## 2011-10-06 DIAGNOSIS — M7989 Other specified soft tissue disorders: Secondary | ICD-10-CM

## 2011-10-06 DIAGNOSIS — M549 Dorsalgia, unspecified: Secondary | ICD-10-CM

## 2011-10-06 LAB — CBC WITH DIFFERENTIAL/PLATELET
Basophils Relative: 0 % (ref 0–1)
Eosinophils Absolute: 0.1 10*3/uL (ref 0.0–0.7)
HCT: 34.7 % — ABNORMAL LOW (ref 39.0–52.0)
Hemoglobin: 11.6 g/dL — ABNORMAL LOW (ref 13.0–17.0)
Lymphocytes Relative: 10 % — ABNORMAL LOW (ref 12–46)
MCH: 28.5 pg (ref 26.0–34.0)
MCHC: 33.4 g/dL (ref 30.0–36.0)
MCV: 85.3 fL (ref 78.0–100.0)
Neutro Abs: 5.2 10*3/uL (ref 1.7–7.7)

## 2011-10-06 LAB — COMPREHENSIVE METABOLIC PANEL
ALT: 23 U/L (ref 0–53)
AST: 11 U/L (ref 0–37)
Albumin: 2 g/dL — ABNORMAL LOW (ref 3.5–5.2)
Alkaline Phosphatase: 527 U/L — ABNORMAL HIGH (ref 39–117)
BUN: 19 mg/dL (ref 6–23)
CO2: 34 mEq/L — ABNORMAL HIGH (ref 19–32)
Calcium: 8.3 mg/dL — ABNORMAL LOW (ref 8.4–10.5)
Chloride: 91 mEq/L — ABNORMAL LOW (ref 96–112)
Creatinine, Ser: 0.67 mg/dL (ref 0.50–1.35)
GFR calc Af Amer: >90 mL/min (ref >90)
GFR calc non Af Amer: >90 mL/min (ref >90)
Glucose, Bld: 267 mg/dL — ABNORMAL HIGH (ref 70–99)
Potassium: 3.3 mEq/L — ABNORMAL LOW (ref 3.5–5.1)
Sodium: 132 mEq/L — ABNORMAL LOW (ref 135–145)
Total Bilirubin: 0.2 mg/dL — ABNORMAL LOW (ref 0.3–1.2)
Total Protein: 4.9 g/dL — ABNORMAL LOW (ref 6.0–8.3)

## 2011-10-06 LAB — GLUCOSE, CAPILLARY
Glucose-Capillary: 154 mg/dL — ABNORMAL HIGH (ref 70–99)
Glucose-Capillary: 196 mg/dL — ABNORMAL HIGH (ref 70–99)
Glucose-Capillary: 157 mg/dL — ABNORMAL HIGH (ref 70–99)

## 2011-10-06 MED ORDER — ADULT MULTIVITAMIN W/MINERALS CH
1.0000 | ORAL_TABLET | Freq: Every day | ORAL | Status: DC
  Administered 2011-10-06 – 2011-10-09 (×3): 1 via ORAL
  Filled 2011-10-06 (×4): qty 1

## 2011-10-06 MED ORDER — THIAMINE HCL 100 MG/ML IJ SOLN
100.0000 mg | Freq: Every day | INTRAMUSCULAR | Status: DC
  Filled 2011-10-06 (×4): qty 1

## 2011-10-06 MED ORDER — AMLODIPINE BESYLATE 5 MG PO TABS
5.0000 mg | ORAL_TABLET | Freq: Every day | ORAL | Status: DC
  Administered 2011-10-06 – 2011-10-07 (×2): 5 mg via ORAL
  Filled 2011-10-06 (×3): qty 1

## 2011-10-06 MED ORDER — PANTOPRAZOLE SODIUM 40 MG PO TBEC
40.0000 mg | DELAYED_RELEASE_TABLET | Freq: Every day | ORAL | Status: DC
  Administered 2011-10-07 – 2011-10-08 (×2): 40 mg via ORAL
  Filled 2011-10-06 (×4): qty 1

## 2011-10-06 MED ORDER — FENTANYL 12 MCG/HR TD PT72
12.5000 ug | MEDICATED_PATCH | TRANSDERMAL | Status: DC
  Administered 2011-10-06: 12.5 ug via TRANSDERMAL
  Filled 2011-10-06: qty 1

## 2011-10-06 MED ORDER — ACETAMINOPHEN 650 MG RE SUPP: 650.0000 mg | Freq: Four times a day (QID) | RECTAL | Status: DC | PRN

## 2011-10-06 MED ORDER — ACETAMINOPHEN 325 MG PO TABS: 650.0000 mg | ORAL_TABLET | Freq: Four times a day (QID) | ORAL | Status: DC | PRN

## 2011-10-06 MED ORDER — VITAMIN B-1 100 MG PO TABS
100.0000 mg | ORAL_TABLET | Freq: Every day | ORAL | Status: DC
  Administered 2011-10-06 – 2011-10-09 (×3): 100 mg via ORAL
  Filled 2011-10-06 (×4): qty 1

## 2011-10-06 MED ORDER — POTASSIUM CHLORIDE IN NACL 20-0.9 MEQ/L-% IV SOLN
INTRAVENOUS | Status: DC
  Administered 2011-10-06: 02:00:00 via INTRAVENOUS
  Filled 2011-10-06 (×2): qty 1000

## 2011-10-06 MED ORDER — LORAZEPAM 1 MG PO TABS
1.0000 mg | ORAL_TABLET | Freq: Four times a day (QID) | ORAL | Status: DC | PRN
  Administered 2011-10-08: 1 mg via ORAL
  Filled 2011-10-06: qty 1

## 2011-10-06 MED ORDER — FOLIC ACID 1 MG PO TABS
1.0000 mg | ORAL_TABLET | Freq: Every day | ORAL | Status: DC
  Administered 2011-10-06 – 2011-10-09 (×3): 1 mg via ORAL
  Filled 2011-10-06 (×4): qty 1

## 2011-10-06 MED ORDER — LORAZEPAM 2 MG/ML IJ SOLN: 0.0000 mg | Freq: Four times a day (QID) | INTRAMUSCULAR | Status: AC

## 2011-10-06 MED ORDER — OXYCODONE-ACETAMINOPHEN 5-325 MG PO TABS
1.0000 | ORAL_TABLET | Freq: Once | ORAL | Status: AC
  Administered 2011-10-06: 1 via ORAL
  Filled 2011-10-06: qty 1

## 2011-10-06 MED ORDER — ATORVASTATIN CALCIUM 20 MG PO TABS
20.0000 mg | ORAL_TABLET | Freq: Every day | ORAL | Status: DC
  Administered 2011-10-06 – 2011-10-09 (×3): 20 mg via ORAL
  Filled 2011-10-06 (×4): qty 1

## 2011-10-06 MED ORDER — LORAZEPAM 2 MG/ML IJ SOLN: 1.0000 mg | Freq: Four times a day (QID) | INTRAMUSCULAR | Status: DC | PRN

## 2011-10-06 MED ORDER — IPRATROPIUM-ALBUTEROL 20-100 MCG/ACT IN AERS
1.0000 | INHALATION_SPRAY | Freq: Four times a day (QID) | RESPIRATORY_TRACT | Status: DC | PRN
  Filled 2011-10-06: qty 4

## 2011-10-06 MED ORDER — LORAZEPAM 2 MG/ML IJ SOLN: 0.0000 mg | Freq: Two times a day (BID) | INTRAMUSCULAR | Status: DC

## 2011-10-06 MED ORDER — LEVOFLOXACIN IN D5W 500 MG/100ML IV SOLN
500.0000 mg | INTRAVENOUS | Status: DC
  Administered 2011-10-06 – 2011-10-07 (×2): 500 mg via INTRAVENOUS
  Filled 2011-10-06 (×2): qty 100

## 2011-10-06 MED ORDER — ASPIRIN EC 81 MG PO TBEC
81.0000 mg | DELAYED_RELEASE_TABLET | Freq: Every day | ORAL | Status: DC
  Administered 2011-10-06 – 2011-10-09 (×3): 81 mg via ORAL
  Filled 2011-10-06 (×4): qty 1

## 2011-10-06 MED ORDER — TRAMADOL HCL 50 MG PO TABS
50.0000 mg | ORAL_TABLET | Freq: Four times a day (QID) | ORAL | Status: DC | PRN
  Administered 2011-10-06: 50 mg via ORAL
  Filled 2011-10-06: qty 1

## 2011-10-06 MED ORDER — TRAZODONE HCL 50 MG PO TABS
50.0000 mg | ORAL_TABLET | Freq: Every day | ORAL | Status: DC
  Administered 2011-10-06 – 2011-10-08 (×2): 50 mg via ORAL
  Filled 2011-10-06 (×5): qty 1

## 2011-10-06 MED ORDER — OXYCODONE HCL 20 MG PO TB12
20.0000 mg | ORAL_TABLET | Freq: Two times a day (BID) | ORAL | Status: DC
  Administered 2011-10-06 – 2011-10-09 (×7): 20 mg via ORAL
  Filled 2011-10-06 (×8): qty 1

## 2011-10-06 MED ORDER — ONDANSETRON HCL 4 MG PO TABS: 4.0000 mg | ORAL_TABLET | Freq: Four times a day (QID) | ORAL | Status: DC | PRN

## 2011-10-06 MED ORDER — ONDANSETRON HCL 4 MG/2ML IJ SOLN: 4.0000 mg | Freq: Four times a day (QID) | INTRAMUSCULAR | Status: DC | PRN

## 2011-10-06 MED ORDER — MORPHINE SULFATE 2 MG/ML IJ SOLN
1.0000 mg | INTRAMUSCULAR | Status: DC | PRN
  Administered 2011-10-06 – 2011-10-07 (×2): 1 mg via INTRAVENOUS
  Filled 2011-10-06 (×2): qty 1

## 2011-10-06 MED ORDER — VITAMIN B-12 1000 MCG PO TABS
1000.0000 ug | ORAL_TABLET | Freq: Every day | ORAL | Status: DC
  Administered 2011-10-06 – 2011-10-09 (×3): 1000 ug via ORAL
  Filled 2011-10-06 (×4): qty 1

## 2011-10-06 MED ORDER — CIPROFLOXACIN IN D5W 400 MG/200ML IV SOLN
400.0000 mg | Freq: Two times a day (BID) | INTRAVENOUS | Status: DC
  Administered 2011-10-06 (×2): 400 mg via INTRAVENOUS
  Filled 2011-10-06 (×3): qty 200

## 2011-10-06 MED ORDER — GLIMEPIRIDE 4 MG PO TABS
4.0000 mg | ORAL_TABLET | Freq: Two times a day (BID) | ORAL | Status: DC
  Administered 2011-10-06 – 2011-10-07 (×3): 4 mg via ORAL
  Filled 2011-10-06 (×5): qty 1

## 2011-10-06 MED ORDER — INSULIN ASPART 100 UNIT/ML ~~LOC~~ SOLN
0.0000 [IU] | Freq: Three times a day (TID) | SUBCUTANEOUS | Status: DC
  Administered 2011-10-06 – 2011-10-07 (×3): 2 [IU] via SUBCUTANEOUS

## 2011-10-06 MED ORDER — VANCOMYCIN HCL IN DEXTROSE 1-5 GM/200ML-% IV SOLN
1000.0000 mg | Freq: Two times a day (BID) | INTRAVENOUS | Status: DC
  Administered 2011-10-06 – 2011-10-08 (×6): 1000 mg via INTRAVENOUS
  Filled 2011-10-06 (×7): qty 200

## 2011-10-06 MED ORDER — INSULIN GLARGINE 100 UNIT/ML ~~LOC~~ SOLN
50.0000 [IU] | Freq: Two times a day (BID) | SUBCUTANEOUS | Status: DC
  Administered 2011-10-06 – 2011-10-09 (×8): 50 [IU] via SUBCUTANEOUS

## 2011-10-06 NOTE — Progress Notes (Signed)
Nutrition Brief Note  Patient identified on the Malnutrition Screening Tool (MST) report for unintended weight loss and poor appetite, generating a score of 3.   Body mass index is 27.99 kg/(m^2). Pt meets criteria for overweight based on current BMI.   Current diet order is CHO modified medium, patient is consuming approximately 80-100% of meals at this time. Labs and medications reviewed.   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 Noted pt with 15 pound unintended weight loss in the past 2 months however pt eating well during earlier admission last month. Attempted to meet with pt, however RN reports pt not agreeable to talking today but has been eating well.   As pt under hospice care at home and eating well, no nutrition interventions warranted at this time. If nutrition issues arise, please consult RD.   Levon Hedger MS, RD, LDN [phone] Pager [phone] After Hours Pager

## 2011-10-06 NOTE — Progress Notes (Signed)
ANTIBIOTIC CONSULT NOTE - INITIAL  Pharmacy Consult for vancomycin/cipro Indication: cellulitis  Allergies  Allergen Reactions  . Penicillins     Pt reports he is allergic to all antibiotics. States that he "cannot take antibiotics because they make him go to the bathroom a lot and I won't take them."     Patient Measurements: Weight: 184 lb 1.6 oz (83.507 kg) Adjusted Body Weight:   Vital Signs: Temp: 98 F (36.7 C) (09/10 0054) Temp src: Oral (09/10 0054) BP: 143/55 mmHg (09/10 0054) Pulse Rate: 105  (09/10 0054) Intake/Output from previous day: 09/09 0701 - 09/10 0700 In: -  Out: 200 [Urine:200] Intake/Output from this shift: Total I/O In: -  Out: 200 [Urine:200]  Labs:  [facility] 10/05/11 2050  WBC 7.4  HGB 13.7  PLT 124*  LABCREA --  CREATININE 0.64   The CrCl is unknown because both a height and weight (above a minimum accepted value) are required for this calculation. No results found for this basename: VANCOTROUGH:2,VANCOPEAK:2,VANCORANDOM:2,GENTTROUGH:2,GENTPEAK:2,GENTRANDOM:2,TOBRATROUGH:2,TOBRAPEAK:2,TOBRARND:2,AMIKACINPEAK:2,AMIKACINTROU:2,AMIKACIN:2, in the last 72 hours   Microbiology: No results found for this or any previous visit (from the past 720 hour(s)).  Medical History: Past Medical History  Diagnosis Date  . Hypertension   . Diabetes mellitus   . Back pain   . Cancer   . GERD (gastroesophageal reflux disease)     Medications:  Anti-infectives     Start     Dose/Rate Route Frequency Ordered Stop   10/05/11 2330   vancomycin (VANCOCIN) IVPB 1000 mg/200 mL premix        1,000 mg 200 mL/hr over 60 Minutes Intravenous  Once 10/05/11 2329 10/06/11 0051         Assessment: Patient with cellulitis.  Goal of Therapy:  Vancomycin trough level 10-15 mcg/ml Cipro dosed based on patient weight and renal function   Plan:  Measure antibiotic drug levels at steady state Follow up culture results Vancomycin 1gm iv q12hr, cipro 400mg   iv q12hr  Darlina Guys, Boy Delamater Crowford 10/06/2011,1:26 AM

## 2011-10-06 NOTE — Progress Notes (Signed)
[facility] RN came to visit pt. Was informed by RN that pt is a heavy drinker and has been intoxicated before when seen by hospice nurse practitioner. MD called for CIWA protocol, orders given.

## 2011-10-06 NOTE — Plan of Care (Signed)
Problem: Phase II Progression Outcomes Goal: Discharge plan established Outcome: Progressing Has hospice at home

## 2011-10-06 NOTE — H&P (Signed)
Bradley [name] is an 66 y.o. male.  Patient was seen and examined on October 06, 2011 and 12:15 AM. PCP - Dr. Florentina Jenny.  Chief Complaint: Left leg erythema and swelling. HPI: 66 year old male who was just discharged home with home hospice for after being diagnosed with metastatic cancer to the spine presents with increasing swelling and erythema of the left leg which has been worsening over the last few days with discharge. Denies any fever chills. In addition patient has been having increasing low back pain. Denies any incontinence of urine or bowels. Denies nausea vomiting abdominal pain or diarrhea. His left leg swelling is associated with erythema and is consistent with cellulitis. Patient has been admitted for further management.  Past Medical History  Diagnosis Date  . Hypertension   . Diabetes mellitus   . Back pain   . Cancer   . GERD (gastroesophageal reflux disease)     Past Surgical History  Procedure Date  . Total hip arthroplasty   . Toe resections     History reviewed. No pertinent family history. Social History:  reports that he has been smoking Cigarettes.  He has a 24.5 pack-year smoking history. He has never used smokeless tobacco. He reports that he drinks alcohol. He reports that he does not use illicit drugs.  Allergies:  Allergies  Allergen Reactions  . Penicillins     Pt reports he is allergic to all antibiotics. States that he "cannot take antibiotics because they make him go to the bathroom a lot and I won't take them."      (Not in a hospital admission)  Results for orders placed during the hospital encounter of 10/05/11 (from the past 48 hour(s))  CBC WITH DIFFERENTIAL     Status: Abnormal   Collection Time   10/05/11  8:50 PM      Component Value Range Comment   WBC 7.4  4.0 - 10.5 K/uL    RBC 4.76  4.22 - 5.81 MIL/uL    Hemoglobin 13.7  13.0 - 17.0 g/dL    HCT 16.1  09.6 - 04.5 %    MCV 85.3  78.0 - 100.0 fL    MCH 28.8  26.0 - 34.0 pg    MCHC 33.7  30.0 - 36.0 g/dL    RDW 40.9  81.1 - 91.4 %    Platelets 124 (*) 150 - 400 K/uL    Neutrophils Relative 83 (*) 43 - 77 %    Neutro Abs 6.2  1.7 - 7.7 K/uL    Lymphocytes Relative 10 (*) 12 - 46 %    Lymphs Abs 0.7  0.7 - 4.0 K/uL    Monocytes Relative 6  3 - 12 %    Monocytes Absolute 0.5  0.1 - 1.0 K/uL    Eosinophils Relative 1  0 - 5 %    Eosinophils Absolute 0.1  0.0 - 0.7 K/uL    Basophils Relative 0  0 - 1 %    Basophils Absolute 0.0  0.0 - 0.1 K/uL   COMPREHENSIVE METABOLIC PANEL     Status: Abnormal   Collection Time   10/05/11  8:50 PM      Component Value Range Comment   Sodium 132 (*) 135 - 145 mEq/L    Potassium 3.1 (*) 3.5 - 5.1 mEq/L    Chloride 89 (*) 96 - 112 mEq/L    CO2 37 (*) 19 - 32 mEq/L    Glucose, Bld 213 (*) 70 - 99  mg/dL    BUN 17  6 - 23 mg/dL    Creatinine, Ser 1.61  0.50 - 1.35 mg/dL    Calcium 9.6  8.4 - 09.6 mg/dL    Total Protein 6.3  6.0 - 8.3 g/dL    Albumin 2.6 (*) 3.5 - 5.2 g/dL    AST 15  0 - 37 U/L    ALT 28  0 - 53 U/L    Alkaline Phosphatase 642 (*) 39 - 117 U/L    Total Bilirubin 0.4  0.3 - 1.2 mg/dL    GFR calc non Af Amer >90  >90 mL/min    GFR calc Af Amer >90  >90 mL/min    No results found.  Review of Systems  Constitutional: Negative.   HENT: Negative.   Eyes: Negative.   Respiratory: Negative.   Cardiovascular: Negative.   Gastrointestinal: Negative.   Genitourinary: Negative.   Musculoskeletal: Positive for back pain.       Left leg swelling and erythema.  Skin: Negative.   Neurological: Negative.   Endo/Heme/Allergies: Negative.   Psychiatric/Behavioral: Negative.     Blood pressure 131/65, pulse 97, temperature 98.3 F (36.8 C), temperature source Oral, resp. rate 15, SpO2 92.00%. Physical Exam  Constitutional: He is oriented to person, place, and time. He appears well-developed and well-nourished. No distress.  HENT:  Head: Normocephalic and atraumatic.  Right Ear: External ear normal.  Left Ear:  External ear normal.  Nose: Nose normal.  Mouth/Throat: Oropharynx is clear and moist. No oropharyngeal exudate.  Eyes: Conjunctivae are normal. Pupils are equal, round, and reactive to light. Right eye exhibits no discharge. Left eye exhibits no discharge. No scleral icterus.  Neck: Normal range of motion. Neck supple.  Cardiovascular: Normal rate and regular rhythm.   Respiratory: Effort normal and breath sounds normal. No respiratory distress. He has no wheezes. He has no rales.  GI: Soft. Bowel sounds are normal. He exhibits no distension. There is no tenderness. There is no rebound.  Musculoskeletal:       Left leg is erythematous and edematous with discharge.  Neurological: He is alert and oriented to person, place, and time.       Low back pain on moving both legs.  Skin: He is not diaphoretic.     Assessment/Plan #1. Left leg cellulitis - patient's left leg is erythematous and swollen. Patient has been started on vancomycin and ciprofloxacin empirically for cellulitis. Since patient has swelling all the way to the calf check Dopplers to make sure there is no DVT. Check MRI of the left leg to rule out osteomyelitis. #2. Low back pain with recent diagnosis of metastasis to the spine - patient does not want to discuss any further treatment of possible cancer. At this time MRI L-spine has been ordered to check for any worsening of his metastasis. #3. Diabetes mellitus type 2 - continue home medications with sliding-scale coverage. #4. Hypertension - continue home medications.  Please consult hospice team in a.m. For goals of care.  CODE STATUS - DO NOT RESUSCITATE.  Dalbert Stillings N. 10/06/2011, 12:44 AM

## 2011-10-06 NOTE — Progress Notes (Signed)
Pt refused MRI of back. MD paged to make aware. No new orders.  Pt continues to refuse nursing care regarding hygiene, will continue to offer.

## 2011-10-06 NOTE — Progress Notes (Addendum)
Hospitalist followup note:   66 year old white male with past medical history of recently diagnosed metastatic cancer presents with lower extremity cellulitis of left leg.  Patient admitted earlier this morning. Overall stable, complaining of some significant lower back pain, suspected to be from metastatic cancer to bone.  Exam: Left lower extremity notes improving erythema, but not yet resolved.  Plan: Change Cipro to IV Levaquin for better coverage and continue IV vancomycin for now. Have asked for wound care advisement given open sores and status post previous BKA which is likely cause of cellulitis. Added Duragesic patch for pain control-patient is being followed by hospice at home. Otherwise looks to be doing well and likely can be discharged home tomorrow (patient will need help with transportation home). No evidence of wound cultures done given lack of large open wounds. For antibiotic coverage, would advise patient to home on by mouth Levaquin only, and if erythema starts to return as per home hospice nurse-Zyvox can be added as well as outpatient.

## 2011-10-06 NOTE — Progress Notes (Signed)
*  Preliminary Results* Bilateral lower extremity venous duplex completed. Technically limited study due to patient cooperation and swelling, therefore unable to visualize the right popliteal and posterior tibial veins and bilateral peroneal veins. Bilateral lower extremities are negative for deep vein thrombosis. However, due to lack of visualization of some vessels deep vein thrombosis cannot be excluded. Preliminary results discussed with RN Fannie Knee.  10/06/2011 4:30 PM Gertie Fey, RDMS, RDCS

## 2011-10-07 LAB — GLUCOSE, CAPILLARY
Glucose-Capillary: 39 mg/dL — CL (ref 70–99)
Glucose-Capillary: 84 mg/dL (ref 70–99)
Glucose-Capillary: 260 mg/dL — ABNORMAL HIGH (ref 70–99)

## 2011-10-07 LAB — BASIC METABOLIC PANEL
CO2: 32 mEq/L (ref 19–32)
Chloride: 94 mEq/L — ABNORMAL LOW (ref 96–112)
GFR calc Af Amer: >90 mL/min (ref >90)
Potassium: 3.7 mEq/L (ref 3.5–5.1)
Sodium: 131 mEq/L — ABNORMAL LOW (ref 135–145)

## 2011-10-07 MED ORDER — METRONIDAZOLE 500 MG PO TABS
500.0000 mg | ORAL_TABLET | Freq: Three times a day (TID) | ORAL | Status: DC
  Administered 2011-10-07 – 2011-10-09 (×5): 500 mg via ORAL
  Filled 2011-10-07 (×10): qty 1

## 2011-10-07 MED ORDER — POLYVINYL ALCOHOL 1.4 % OP SOLN
2.0000 [drp] | OPHTHALMIC | Status: DC | PRN
  Filled 2011-10-07: qty 15

## 2011-10-07 MED ORDER — DEXAMETHASONE 2 MG PO TABS
2.0000 mg | ORAL_TABLET | Freq: Two times a day (BID) | ORAL | Status: DC
  Administered 2011-10-07 – 2011-10-09 (×4): 2 mg via ORAL
  Filled 2011-10-07 (×6): qty 1

## 2011-10-07 MED ORDER — OXYCODONE-ACETAMINOPHEN 5-325 MG PO TABS
1.0000 | ORAL_TABLET | Freq: Four times a day (QID) | ORAL | Status: DC | PRN
  Administered 2011-10-08: 1 via ORAL
  Filled 2011-10-07: qty 2

## 2011-10-07 MED ORDER — MORPHINE SULFATE 2 MG/ML IJ SOLN
2.0000 mg | INTRAMUSCULAR | Status: DC | PRN
Start: 2011-10-07 — End: 2011-10-09
  Administered 2011-10-08: 2 mg via INTRAVENOUS
  Filled 2011-10-07: qty 1

## 2011-10-07 MED ORDER — COLLAGENASE 250 UNIT/GM EX OINT
TOPICAL_OINTMENT | Freq: Every day | CUTANEOUS | Status: DC
  Administered 2011-10-08: 15:00:00 via TOPICAL
  Filled 2011-10-07: qty 30

## 2011-10-07 MED ORDER — MORPHINE SULFATE 4 MG/ML IJ SOLN
4.0000 mg | Freq: Once | INTRAMUSCULAR | Status: AC
  Administered 2011-10-07: 4 mg via INTRAVENOUS
  Filled 2011-10-07: qty 1

## 2011-10-07 NOTE — Progress Notes (Signed)
Palliative Medicine Team consult requested by Dr Brien Few; patient is currently being followed by [facility]; called and spoke with [facility] RN Team Manager; as [facility] is co-ordinating/ managing patient's care they will be following patient daily during hospital stay and will make PMT aware if assistance is needed for goals of care/ future planning- will await call back from [facility] [place] before PMT engages. - Dr Brien Few contacted aware of above   Valente David, RN 10/07/2011, 3:19 PM Palliative Medicine Team RN Liaison [phone]

## 2011-10-07 NOTE — Progress Notes (Signed)
Inpatient Diabetes Program Recommendations  AACE/ADA: New Consensus Statement on Inpatient Glycemic Control (2013)  Target Ranges:  Prepandial:   less than 140 mg/dL      Peak postprandial:   less than 180 mg/dL (1-2 hours)      Critically ill patients:  140 - 180 mg/dL   Results for JORDA, CUNG (MRN [id number]) as of 10/07/2011 09:39  Ref. Range 10/07/2011 07:51  Glucose-Capillary Latest Range: 70-99 mg/dL 39 (LL)   Hypoglycemic this morning with CBG of 39 mg/dl.  May need to reduce Lantus dose if this becomes a recurring problem.  Inpatient Diabetes Program Recommendations Insulin - Basal: Please consider decreasing Lantus to 40 units bid.  Note: Will follow. Ambrose Finland RN, MSN, CDE Diabetes Coordinator Inpatient Diabetes Program [phone]

## 2011-10-07 NOTE — Consult Note (Addendum)
WOC consult Note Reason for Consult:Right ischial tuberosity unstageable pressure ulcer, bilateral ulcerations on dorsal aspect of feet Wound type:Pressure, infectious, unknown etiology (feet) Pressure Ulcer POA: Yes Measurement:Right ischial tuberosity ulceration measures 7cm x 9cm and is 80% necrotic (based obscured by black, firmly adherent but soft eschar), 20% pink, friable tissue at periphery.  Evidence of healed ulceration on the left buttock.  Bilateral feet (dorsal aspects) have ulcerations: Right affected area measures 10x6cm and presents with 2 ulcerations within this, the largest of which measures 2cm x 3cm.  Both are covered with an adherent yellow slough.  The left foot presents with a 9cm x 8cm ulcer, full thickness, that is covered by a thin layer of epidermis with thick yellow exudate beneath.  Periwound is erythematous.  Bilateral LEs are with numerous scabbed areas and erythema.  Noted in request for Palliative Care evaluation. Right forefoot (transmetatarsal) has occurred in the past. Patient unable to tell me when. Wound bed:As above Drainage (amount, consistency, odor) Scant amount on old dressings (feet).  Sanguinous from peripheral edges of ischial tuberosity wound when old dry dressing is removed. Periwound:As above Dressing procedure/placement/frequency:Non-adherent dressings are ordered for the dorsal feet.  The ischial tuberosity wound will have a silicone foam placed tonight and an enzymatic debriding agent applied daily beginning tomorrow.Should the goals of care change to be comfort care or less aggressive care is desired, twice daily saline dressings to the ischial tuberosity ulcer would suffice.  If you agree or desire, please change this care order. A low air loss mattress overlay is provided. I will not follow.  Please re-consult if needed. Thanks, Ladona Mow, MSN, RN, [facility], CWOCN [phone])

## 2011-10-07 NOTE — Progress Notes (Signed)
Dressings changed to bilateral lower legs and feet.  Bilateral lower legs weeping.  Wounds to bilateral feet with moderate amount of purulent drainage.  Blister to top of right foot intact.  Adaptic, 4x4 and kling applied to bilateral foot wounds.  Patient with palpable pulses in bilteral feet.  Will continue to monitor.

## 2011-10-07 NOTE — Progress Notes (Signed)
Pt. Refused visual acuity screening. Pt. Says, "he can see fine."

## 2011-10-07 NOTE — Plan of Care (Signed)
Pt. Complaining of blurred vision. Pupils are responsive. Pt. Is alert and oriented x3. No other concerning factors.  Motor response equal bilaterally to all four extremities, face is symmetrical when speaking/smiling/sticking out tongue.  Pt.s eyes are red, not itchy. Pt. Has HX of dry, eyes, this correlates with assessment and history.  Notified physician of symptoms due to new onset.

## 2011-10-07 NOTE — Progress Notes (Signed)
Pt. Complaining that there "somethin' just aint right."  Although pt. Will not answer any of my assessment questions. Pt. Denies pain. He may be anxious/irritable but refuses to take any Ativan at this time.  Pt. Pulled out IV, will get new IV access and ask again if pt. Would like any Ativan at that time.
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
## 2011-10-07 NOTE — Progress Notes (Addendum)
[facility] PROGRESS NOTE  Bradley [name] WUJ:[id number] DOB: Feb 02, 1945 DOA: 10/05/2011 PCP: Florentina Jenny, MD  Assessment/Plan: Principal Problem:  *Cellulitis of left leg Active Problems:  Hypertension  DM (diabetes mellitus)  Metastatic cancer to spine    1. Lower extremity cellulitis: patient presented with swelling, erythema and pain of both LE, left>right, as well as multiple infected ulcers of both feet. Now on Vancomycin/Levaquin, day #2, with some improvement. Will consult WOC for local care/recommendations, and add Flagyl, for anerobic cover. .  empirically for cellulitis. Venous doppler was a limited study, but was negative for DVT. DVT. MRI of the left leg is pending.  2. Low back pain/LLE monoparesis: Patient has a known diagnosis of metastatic adenocarcinoma, with involvement of spine.  He appears disinclined to entertain discussion of further treatment of possible cancer. MRI L-spine is pending. 3. Diabetes mellitus type 2: Appears controlled. Had a hypoglycemic episode this AM, addressed successfully glucose supplements. Will hold SSI and oral hypoglycemics.  4. Hypertension: Sub-optimally controlled on pre-admission medications. Will observe, and manage as indicated.    Code Status: DNR Family Communication:  Disposition Plan: To be determined.   Brief narrative: 66 year old male, with known history of HTN, DM, GERD, metastatic adenocarcinoma of unknown primary. He is s/p hospitalization 09/10/11-09/15/11, for backpaindue to spinal metastatic disease, and uncontrolled diabetes mellitus. At that time, he was discharged with home hospice. He now presents with presents with increasing swelling and erythema of the left leg which has been worsening over the last few days with discharge, as well as increasing low back pain and was admitted for further management.   Consultants:  N/A.   Procedures:  N/A.   Antibiotics:  Vancomycin 10/06/11>>>  Levaquin  10/06/11>>>  HPI/Subjective: Feels better today, although redness and oozing persists in both legs.   Objective: Vital signs in last 24 hours: Temp:  [97.9 F (36.6 C)-99.8 F (37.7 C)] 98.2 F (36.8 C) (09/11 1321) Pulse Rate:  [94-103] 102  (09/11 1321) Resp:  [18-20] 18  (09/11 1321) BP: (126-160)/(55-70) 160/62 mmHg (09/11 1321) SpO2:  [92 %-100 %] 100 % (09/11 1321) Weight change:  Last BM Date: 10/05/11  Intake/Output from previous day: 09/10 0701 - 09/11 0700 In: 840 [P.O.:840] Out: 700 [Urine:700] Total I/O In: 360 [P.O.:360] Out: 600 [Urine:600]   Physical Exam: General: looks unkempt, comfortable, alert, communicative, fully oriented, not short of breath at rest. Eating with gusto.  HEENT: Mild clinical pallor, no jaundice, no conjunctival injection or discharge. Hydration status is fair.  NECK:  Supple, JVP not seen, no carotid bruits, no palpable lymphadenopathy, no palpable goiter. CHEST:  Clinically clear to auscultation, no wheezes, no crackles. HEART:  Sounds 1 and 2 heard, normal, regular, no murmurs. ABDOMEN:  Obese, soft, non-tender, no palpable organomegaly, no palpable masses, normal bowel sounds. GENITALIA:  Not examined. LOWER EXTREMITIES:  Minimal pitting edema, palpable peripheral pulses. 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 On the remainder of the foot, there are several infected-appearing ulcers, with purulent exudate and peri-focal erythema, and dressing is soaked. Left LE reveals erythema of foot, up to just above left ankle, as well as several infected sores/oozing.  MUSCULOSKELETAL SYSTEM:  Generalized osteoarthritic changes, otherwise, normal. CENTRAL NERVOUS SYSTEM:  Has pronounced LLE weakness.   Lab Results:  Basename 10/06/11 0337 10/05/11 2050  WBC 6.5 7.4  HGB 11.6* 13.7  HCT 34.7* 40.6  PLT 103* 124*    Basename 10/07/11 0330 10/06/11 0337  NA 131* 132*  K 3.7 3.3*  CL 94* 91*  CO2 32 34*  GLUCOSE 145* 267*  BUN  18 19  CREATININE 0.70 0.67  CALCIUM 8.5 8.3*   No results found for this or any previous visit (from the past 240 hour(s)).   Studies/Results: No results found.  Medications: Scheduled Meds:   . amLODipine  5 mg Oral Daily  . aspirin EC  81 mg Oral Daily  . atorvastatin  20 mg Oral Daily  . fentaNYL  12.5 mcg Transdermal Q72H  . folic acid  1 mg Oral Daily  . glimepiride  4 mg Oral BID WC  . insulin aspart  0-9 Units Subcutaneous TID WC  . insulin glargine  50 Units Subcutaneous BID  . levofloxacin (LEVAQUIN) IV  500 mg Intravenous Q24H  . LORazepam  0-4 mg Intravenous Q6H   Followed by  . LORazepam  0-4 mg Intravenous Q12H  . multivitamin with minerals  1 tablet Oral Daily  . oxyCODONE  20 mg Oral BID  . pantoprazole  40 mg Oral Q1200  . thiamine  100 mg Oral Daily   Or  . thiamine  100 mg Intravenous Daily  . traZODone  50 mg Oral QHS  . vancomycin  1,000 mg Intravenous Q12H  . vitamin B-12  1,000 mcg Oral Daily  . DISCONTD: ciprofloxacin  400 mg Intravenous BID   Continuous Infusions:   . DISCONTD: 0.9 % NaCl with KCl 20 mEq / L 75 mL/hr at 10/06/11 0157   PRN Meds:.acetaminophen, acetaminophen, Ipratropium-Albuterol, LORazepam, LORazepam, morphine injection, ondansetron (ZOFRAN) IV, ondansetron, polyvinyl alcohol, traMADol    LOS: 2 days   Bradley [name],CHRISTOPHER  [facility] Pager [phone]. If 8PM-8AM, please contact night-coverage at [email or web address], password [facility] 10/07/2011, 1:31 PM  LOS: 2 days

## 2011-10-07 NOTE — Progress Notes (Signed)
CRITICAL VALUE ALERT   Critical value received: CBG 39      .  Date of notification:  10/07/11   Time of notification:  0815   Critical value read back: yes  Nurse who received alert:  Young Berry, RN  MD notified (1st page):  NA  Time of first page:  NA  MD notified (2nd page): NA  Time of second page:  Responding MD:  NA  Time MD responded:  NA Hypoglycemic Protocol initiated.

## 2011-10-08 ENCOUNTER — Inpatient Hospital Stay (HOSPITAL_COMMUNITY): Payer: Medicare Other

## 2011-10-08 LAB — CBC
MCH: 28.1 pg (ref 26.0–34.0)
MCHC: 33 g/dL (ref 30.0–36.0)
MCV: 85 fL (ref 78.0–100.0)
Platelets: 101 10*3/uL — ABNORMAL LOW (ref 150–400)

## 2011-10-08 LAB — COMPREHENSIVE METABOLIC PANEL
ALT: 25 U/L (ref 0–53)
AST: 17 U/L (ref 0–37)
CO2: 31 mEq/L (ref 19–32)
Calcium: 8.7 mg/dL (ref 8.4–10.5)
Creatinine, Ser: 0.58 mg/dL (ref 0.50–1.35)
GFR calc Af Amer: >90 mL/min (ref >90)
GFR calc non Af Amer: >90 mL/min (ref >90)
Sodium: 132 mEq/L — ABNORMAL LOW (ref 135–145)
Total Protein: 5.2 g/dL — ABNORMAL LOW (ref 6.0–8.3)

## 2011-10-08 MED ORDER — INSULIN ASPART 100 UNIT/ML ~~LOC~~ SOLN
20.0000 [IU] | Freq: Once | SUBCUTANEOUS | Status: AC
  Administered 2011-10-08: 20 [IU] via SUBCUTANEOUS

## 2011-10-08 MED ORDER — INSULIN ASPART 100 UNIT/ML ~~LOC~~ SOLN
4.0000 [IU] | Freq: Three times a day (TID) | SUBCUTANEOUS | Status: DC
  Administered 2011-10-08 – 2011-10-09 (×2): 4 [IU] via SUBCUTANEOUS

## 2011-10-08 MED ORDER — INSULIN ASPART 100 UNIT/ML ~~LOC~~ SOLN
0.0000 [IU] | Freq: Three times a day (TID) | SUBCUTANEOUS | Status: DC
  Administered 2011-10-08: 8 [IU] via SUBCUTANEOUS
  Administered 2011-10-09: 3 [IU] via SUBCUTANEOUS

## 2011-10-08 MED ORDER — AMLODIPINE BESYLATE 10 MG PO TABS
10.0000 mg | ORAL_TABLET | Freq: Every day | ORAL | Status: DC
  Administered 2011-10-09: 10 mg via ORAL
  Filled 2011-10-08: qty 1

## 2011-10-08 MED ORDER — LEVOFLOXACIN 500 MG PO TABS
500.0000 mg | ORAL_TABLET | Freq: Every day | ORAL | Status: DC
  Administered 2011-10-08: 500 mg via ORAL
  Filled 2011-10-08 (×2): qty 1

## 2011-10-08 MED ORDER — LISINOPRIL 10 MG PO TABS
10.0000 mg | ORAL_TABLET | Freq: Every day | ORAL | Status: DC
  Administered 2011-10-08 – 2011-10-09 (×2): 10 mg via ORAL
  Filled 2011-10-08 (×2): qty 1

## 2011-10-08 NOTE — Progress Notes (Addendum)
ANTIBIOTIC CONSULT NOTE - Follow Up  Pharmacy Consult for vancomycin, levofloxacin Indication: cellulitis  Allergies  Allergen Reactions  . Penicillins     Pt reports he is allergic to all antibiotics. States that he "cannot take antibiotics because they make him go to the bathroom a lot and I won't take them."     Patient Measurements: Height: 5\' 8"  (172.7 cm) Weight: 184 lb 1.6 oz (83.507 kg) IBW/kg (Calculated) : 68.4  Adjusted Body Weight:   Vital Signs: Temp: 98.1 F (36.7 C) (09/12 0630) Temp src: Oral (09/12 0630) BP: 151/57 mmHg (09/12 0630) Pulse Rate: 89  (09/12 0630) Intake/Output from previous day: 09/11 0701 - 09/12 0700 In: 1640 [P.O.:1240; IV Piggyback:400] Out: 1550 [Urine:1550] Intake/Output from this shift: Total I/O In: 360 [P.O.:360] Out: 500 [Urine:500]  Labs:  [facility] 10/08/11 0342 10/07/11 0330 10/06/11 0337 10/05/11 2050  WBC 7.4 -- 6.5 7.4  HGB 11.6* -- 11.6* 13.7  PLT 101* -- 103* 124*  LABCREA -- -- -- --  CREATININE 0.58 0.70 0.67 --   Estimated Creatinine Clearance: 96.9 ml/min (by C-G formula based on Cr of 0.58). No results found for this basename: VANCOTROUGH:2,VANCOPEAK:2,VANCORANDOM:2,GENTTROUGH:2,GENTPEAK:2,GENTRANDOM:2,TOBRATROUGH:2,TOBRAPEAK:2,TOBRARND:2,AMIKACINPEAK:2,AMIKACINTROU:2,AMIKACIN:2, in the last 72 hours   Microbiology: No results found for this or any previous visit (from the past 720 hour(s)).  Medical History: Past Medical History  Diagnosis Date  . Hypertension   . Diabetes mellitus   . Back pain   . Cancer   . GERD (gastroesophageal reflux disease)     Medications:  Anti-infectives     Start     Dose/Rate Route Frequency Ordered Stop   10/07/11 1430   metroNIDAZOLE (FLAGYL) tablet 500 mg        500 mg Oral 3 times per day 10/07/11 1402     10/06/11 1930   levofloxacin (LEVAQUIN) IVPB 500 mg        500 mg 100 mL/hr over 60 Minutes Intravenous Every 24 hours 10/06/11 1835     10/06/11 1000    vancomycin (VANCOCIN) IVPB 1000 mg/200 mL premix        1,000 mg 200 mL/hr over 60 Minutes Intravenous Every 12 hours 10/06/11 0129     10/06/11 0130   ciprofloxacin (CIPRO) IVPB 400 mg  Status:  Discontinued        400 mg 200 mL/hr over 60 Minutes Intravenous 2 times daily 10/06/11 0129 10/06/11 1835   10/05/11 2330   vancomycin (VANCOCIN) IVPB 1000 mg/200 mL premix        1,000 mg 200 mL/hr over 60 Minutes Intravenous  Once 10/05/11 2329 10/06/11 0051         Assessment: Patient with cellulitis Day #3 vancomycin and Levaquin, Day #2 PO Flagyl. Stable labs   Goal of Therapy:  Vancomycin trough level 10-15 mcg/ml Cipro dosed based on patient weight and renal function   Plan:  1) Continue Vanc 1g IV q12 for now 2) Check vanc trough tonight 3) Change Levaquin to PO 500mg  q24   Hessie Knows, PharmD, BCPS Pager [phone] 10/08/2011 11:14 AM   Vancomycin trough 28.7, doses charted correctly.  Will reduce dose to 1gm iv q24hr. Luetta Nutting PharmD, BCPS  10/09/2011, 1:57 AM

## 2011-10-08 NOTE — Progress Notes (Signed)
Inpatient Diabetes Program Recommendations  AACE/ADA: New Consensus Statement on Inpatient Glycemic Control (2013)  Target Ranges:  Prepandial:   less than 140 mg/dL      Peak postprandial:   less than 180 mg/dL (1-2 hours)      Critically ill patients:  140 - 180 mg/dL   Results for Bradley [name], Bradley [name] (MRN [id number]) as of 10/08/2011 09:44  Ref. Range 10/07/2011 07:51 10/07/2011 08:23 10/07/2011 11:54 10/07/2011 17:16 10/07/2011 21:46  Glucose-Capillary Latest Range: 70-99 mg/dL 39 (LL) 84 409 (H) 811 (H) 260 (H)   Results for Bradley [name], Bradley [name] (MRN [id number]) as of 10/08/2011 09:44  Ref. Range 10/08/2011 07:31  Glucose-Capillary Latest Range: 70-99 mg/dL 213 (H)   Hypoglycemic yesterday morning.  D/C'd SSI and home Amaryl as a result.  Started PO Decadron yesterday around 3pm.  Blood sugars elevated this morning.  Inpatient Diabetes Program Recommendations Insulin - Basal: Noted Lantus currently ordered at home dose of 50 units bid. Correction (SSI): Please restart SSI- Novolog Sensitive scale tid ac + HS.  Note: Will follow. Ambrose Finland RN, MSN, CDE Diabetes Coordinator Inpatient Diabetes Program [phone]

## 2011-10-08 NOTE — Progress Notes (Signed)
[facility] PROGRESS NOTE  Bradley [name] EAV:[id number] DOB: 10-19-1945 DOA: 10/05/2011 PCP: Bradley Jenny, MD  Assessment/Plan: Principal Problem:  *Cellulitis of left leg Active Problems:  Hypertension  DM (diabetes mellitus)  Metastatic cancer to spine    1. Lower extremity cellulitis: Patient presented with swelling, erythema and pain of both LE, left>right, as well as multiple infected ulcers of both feet. Now on Vancomycin/Levaquin, day #3/Flagyl, day#2, with significant improvement. WOC has provided recommendations for local care. Venous doppler was a limited study, but was negative for DVT. DVT. MRI of the left foot is pending.  2. Low back pain/LLE monoparesis: Patient has a known diagnosis of metastatic adenocarcinoma, with involvement of spine.  MRI L-spine of 08/25/11, showed diffuse metastatic disease, most prominent at L4-L5. Although the central canal appears grossly patent, there is bilateral foraminal stenosis, left greater than right, associated with extraosseous extension of tumor. L1 and L4 pathologic compression fractures appears similar to prior. A repeat study is not indicated at this time. Patient has an appointment scheduled to see Dr Cyndie Chime, on 10/15/11, and assures me that he will be able to keep this appointment. Meanwhile, have continued Decadron.  3. Diabetes mellitus type 2: Patient had a hypoglycemic episode in AM of 10/07/11, addressed successfully glucose supplements. Hyperglycemic today, so have re-instated Lantus/SSI, as well as meal cover.  4. Hypertension: Sub-optimally controlled on pre-admission medications. Have increased Norvasc to 10 mg daily, and added ACE-i.    Code Status: DNR Family Communication:  Disposition Plan: Aiming discharge in next couple of days. Patient has emphatically declined placement. .   Brief narrative: 66 year old male, with known history of HTN, DM, GERD, metastatic adenocarcinoma of unknown primary. He is s/p  hospitalization 09/10/11-09/15/11, for backpaindue to spinal metastatic disease, and uncontrolled diabetes mellitus. At that time, he was discharged with home hospice. He now presents with presents with increasing swelling and erythema of the left leg which has been worsening over the last few days with discharge, as well as increasing low back pain and was admitted for further management.   Consultants:  N/A.   Procedures:  N/A.   Antibiotics:  Vancomycin 10/06/11>>>  Levaquin 10/06/11>>>  Flagyl 10/07/11>>> HPI/Subjective: Feels much better.  Objective: Vital signs in last 24 hours: Temp:  [98.1 F (36.7 C)-98.2 F (36.8 C)] 98.1 F (36.7 C) (09/12 0630) Pulse Rate:  [89-102] 89  (09/12 0630) Resp:  [18] 18  (09/12 0630) BP: (151-160)/(57-62) 151/57 mmHg (09/12 0630) SpO2:  [96 %-100 %] 96 % (09/12 0630) Weight change:  Last BM Date: 10/05/11  Intake/Output from previous day: 09/11 0701 - 09/12 0700 In: 1640 [P.O.:1240; IV Piggyback:400] Out: 1550 [Urine:1550] Total I/O In: 360 [P.O.:360] Out: 500 [Urine:500]   Physical Exam: General: looks unkempt, comfortable, alert, communicative, fully oriented, not short of breath at rest. Eating with gusto.  HEENT: Mild clinical pallor, no jaundice, no conjunctival injection or discharge. Hydration status is fair.  NECK:  Supple, JVP not seen, no carotid bruits, no palpable lymphadenopathy, no palpable goiter. CHEST:  Clinically clear to auscultation, no wheezes, no crackles. HEART:  Sounds 1 and 2 heard, normal, regular, no murmurs. ABDOMEN:  Obese, soft, non-tender, no palpable organomegaly, no palpable masses, normal bowel sounds. GENITALIA:  Not examined. LOWER EXTREMITIES:  Minimal pitting edema, palpable peripheral pulses. 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 On the remainder of the foot, there are several infected-appearing ulcers, with purulent exudate and peri-focal erythema, and dressing is soaked. Left LE  reveals erythema of  foot, up to just above left ankle, as well as several infected sores/oozing.  MUSCULOSKELETAL SYSTEM:  Generalized osteoarthritic changes, otherwise, normal. CENTRAL NERVOUS SYSTEM:  Has pronounced LLE weakness.   Lab Results:  [facility] 10/08/11 0342 10/06/11 0337  WBC 7.4 6.5  HGB 11.6* 11.6*  HCT 35.1* 34.7*  PLT 101* 103*    Basename 10/08/11 0342 10/07/11 0330  NA 132* 131*  K 3.8 3.7  CL 95* 94*  CO2 31 32  GLUCOSE 385* 145*  BUN 16 18  CREATININE 0.58 0.70  CALCIUM 8.7 8.5   No results found for this or any previous visit (from the past 240 hour(s)).   Studies/Results: No results found.  Medications: Scheduled Meds:    . amLODipine  5 mg Oral Daily  . aspirin EC  81 mg Oral Daily  . atorvastatin  20 mg Oral Daily  . collagenase   Topical Daily  . dexamethasone  2 mg Oral Q12H  . fentaNYL  12.5 mcg Transdermal Q72H  . folic acid  1 mg Oral Daily  . insulin aspart  0-15 Units Subcutaneous TID WC  . insulin aspart  20 Units Subcutaneous Once  . insulin aspart  4 Units Subcutaneous TID WC  . insulin glargine  50 Units Subcutaneous BID  . levofloxacin  500 mg Oral Daily  . LORazepam  0-4 mg Intravenous Q6H   Followed by  . LORazepam  0-4 mg Intravenous Q12H  . metroNIDAZOLE  500 mg Oral Q8H  .  morphine injection  4 mg Intravenous Once  . multivitamin with minerals  1 tablet Oral Daily  . oxyCODONE  20 mg Oral BID  . pantoprazole  40 mg Oral Q1200  . thiamine  100 mg Oral Daily   Or  . thiamine  100 mg Intravenous Daily  . traZODone  50 mg Oral QHS  . vancomycin  1,000 mg Intravenous Q12H  . vitamin B-12  1,000 mcg Oral Daily  . DISCONTD: glimepiride  4 mg Oral BID WC  . DISCONTD: insulin aspart  0-9 Units Subcutaneous TID WC  . DISCONTD: levofloxacin (LEVAQUIN) IV  500 mg Intravenous Q24H   Continuous Infusions:  PRN Meds:.acetaminophen, acetaminophen, Ipratropium-Albuterol, LORazepam, LORazepam, morphine injection, ondansetron  (ZOFRAN) IV, ondansetron, oxyCODONE-acetaminophen, polyvinyl alcohol, traMADol, DISCONTD:  morphine injection    LOS: 3 days   Bradley [name],Bradley [name]  [facility] Pager [phone]. If 8PM-8AM, please contact night-coverage at [email or web address], password [facility] 10/08/2011, 1:06 PM  LOS: 3 days

## 2011-10-08 NOTE — Progress Notes (Signed)
Follow-up - This Clinical research associate spoke with patient's RN Lafonda Mosses with [facility] yesterday afternoon- she informed [facility] will be following daily and was not sure at this time that a Palliative consult would be warranted-she planned to contact Dr Brien Few with an update.  As of this morning the PMT has not received any additional contact from [facility] to indicate need for PMT involvement - at this time the PMT will sign off please re-consult if team can be of future assistance.  Valente David, RN 10/08/2011, 9:53 AM Palliative Medicine Team RN Liaison [phone]

## 2011-10-09 DIAGNOSIS — F1010 Alcohol abuse, uncomplicated: Secondary | ICD-10-CM | POA: Diagnosis present

## 2011-10-09 DIAGNOSIS — I10 Essential (primary) hypertension: Secondary | ICD-10-CM

## 2011-10-09 LAB — GLUCOSE, CAPILLARY: Glucose-Capillary: 200 mg/dL — ABNORMAL HIGH (ref 70–99)

## 2011-10-09 LAB — CBC
HCT: 37.6 % — ABNORMAL LOW (ref 39.0–52.0)
MCV: 84.7 fL (ref 78.0–100.0)
RDW: 14.4 % (ref 11.5–15.5)
WBC: 7.6 10*3/uL (ref 4.0–10.5)

## 2011-10-09 LAB — BASIC METABOLIC PANEL
BUN: 15 mg/dL (ref 6–23)
Chloride: 97 mEq/L (ref 96–112)
Creatinine, Ser: 0.53 mg/dL (ref 0.50–1.35)
GFR calc Af Amer: >90 mL/min (ref >90)
Glucose, Bld: 162 mg/dL — ABNORMAL HIGH (ref 70–99)

## 2011-10-09 MED ORDER — FOLIC ACID 1 MG PO TABS: 1.0000 mg | ORAL_TABLET | Freq: Every day | ORAL | Status: AC

## 2011-10-09 MED ORDER — AMLODIPINE BESYLATE 10 MG PO TABS: 10.0000 mg | ORAL_TABLET | Freq: Every day | ORAL | Status: DC

## 2011-10-09 MED ORDER — THIAMINE HCL 100 MG PO TABS: 100.0000 mg | ORAL_TABLET | Freq: Every day | ORAL | Status: AC

## 2011-10-09 MED ORDER — DOXYCYCLINE HYCLATE 100 MG PO CAPS: 100.0000 mg | ORAL_CAPSULE | Freq: Two times a day (BID) | ORAL | Status: DC

## 2011-10-09 MED ORDER — ADULT MULTIVITAMIN W/MINERALS CH: 1.0000 | ORAL_TABLET | Freq: Every day | ORAL | Status: AC

## 2011-10-09 MED ORDER — LISINOPRIL 20 MG PO TABS: 20.0000 mg | ORAL_TABLET | Freq: Every day | ORAL | Status: DC

## 2011-10-09 MED ORDER — METRONIDAZOLE 500 MG PO TABS: 500.0000 mg | ORAL_TABLET | Freq: Three times a day (TID) | ORAL | Status: DC

## 2011-10-09 MED ORDER — DOXYCYCLINE HYCLATE 100 MG PO TABS
100.0000 mg | ORAL_TABLET | Freq: Two times a day (BID) | ORAL | Status: DC
  Filled 2011-10-09 (×2): qty 1

## 2011-10-09 MED ORDER — DOXYCYCLINE HYCLATE 100 MG PO CAPS
100.0000 mg | ORAL_CAPSULE | Freq: Two times a day (BID) | ORAL | Status: DC
  Filled 2011-10-09 (×2): qty 1

## 2011-10-09 MED ORDER — VANCOMYCIN HCL IN DEXTROSE 1-5 GM/200ML-% IV SOLN: 1000.0000 mg | Freq: Every day | INTRAVENOUS | Status: DC

## 2011-10-09 MED ORDER — COLLAGENASE 250 UNIT/GM EX OINT: TOPICAL_OINTMENT | Freq: Every day | CUTANEOUS | Status: AC

## 2011-10-09 MED ORDER — ATORVASTATIN CALCIUM 20 MG PO TABS: 20.0000 mg | ORAL_TABLET | Freq: Every day | ORAL | Status: DC

## 2011-10-09 MED ORDER — LISINOPRIL 20 MG PO TABS: 20.0000 mg | ORAL_TABLET | Freq: Every day | ORAL | Status: AC

## 2011-10-09 MED ORDER — DOXYCYCLINE HYCLATE 100 MG PO TABS: 100.0000 mg | ORAL_TABLET | Freq: Two times a day (BID) | ORAL | Status: DC

## 2011-10-09 NOTE — Discharge Summary (Addendum)
Physician Discharge Summary  Bradley [name] NWG:[id number] DOB: 1945-09-15 DOA: 10/05/2011  PCP: Florentina Jenny, MD  Admit date: 10/05/2011 Discharge date: 10/09/2011  Recommendations for Outpatient Follow-up:  1. Home Hospice follow up. 2. Follow up with Dr Cyndie Chime, Medical oncologist.  3. Follow up with Dr Margaretmary Dys, radiation oncologist.  4. Follow up with primary MD.  Discharge Diagnoses:  Principal Problem:  *Cellulitis of left leg Active Problems:  Hypertension  DM (diabetes mellitus)  Metastatic cancer to spine  ETOH abuse   Discharge Condition: Satisfactory.  Diet recommendation: Heart-Health/Carbohydrate-Modified.   Filed Weights   10/06/11 0054  Weight: 83.507 kg (184 lb 1.6 oz)    History of present illness:  66 year old male, with known history of HTN, DM, GERD, metastatic adenocarcinoma of unknown primary. He is s/p hospitalization 09/10/11-09/15/11, for backpaindue to spinal metastatic disease, and uncontrolled diabetes mellitus. At that time, he was discharged with home hospice. He now presents with presents with increasing swelling and erythema of the left leg which had been worsening over the last few days with discharge, as well as increasing low back pain and was admitted for further management.   Hospital Course:  1. Lower extremity cellulitis: Patient presented with swelling, erythema and pain of both LE, left worse than right, as well as multiple infected ulcers of both feet, and was managed with Vancomycin/Levaquin and Flagyl, with dramatic clinical improvement. Venous doppler was a limited study, but was negative for DVT and patient declined MRI of the left foot. WOC has provided recommendations for local care of lower extremity wounds, which were the likely portal of entry. As of 10/09/11, he has been transitioned to oral Doxycycline and Flagyl, to complete another 7 days of treatment.  2. Low back pain/LLE monoparesis: Patient has a known diagnosis of  metastatic adenocarcinoma, with involvement of spine. MRI L-spine of 08/25/11, showed diffuse metastatic disease, most prominent at L4-L5. Although the central canal appears grossly patent, there is bilateral foraminal stenosis, left greater than right, associated with extraosseous extension of tumor. L1 and L4 pathologic compression fractures appears similar to prior. A repeat study was not deemed indicated at this time. Patient has an appointment scheduled to see Dr Cyndie Chime, on 10/15/11, and assures me that he will be able to keep this appointment. Meanwhile we have continued Decadron in pre-admission doses.  3. Diabetes mellitus type 2: Patient has insulin-requiring type 2 diabetes mellitus, and was satisfactorily managed with diet, SSI and Lantus, during his hospitalization. He had a hypoglycemic episode in AM of 10/07/11, addressed successfully glucose supplements, but has since had no recurrence.  4. Hypertension: This was sub-optimally controlled on pre-admission medications, so was managed with an increase in Norvasc to 10 mg daily, and addition of ACE-i.  5. Alcohol abuse: Patient reportedly, does drink a lot of alcohol. He was placed on CIWA protocol, and vitamin supplements during this hospitalization, but no evidence of ETOH withdrawal was documented.    Procedures:  See below.   Consultations:  N/A  Discharge Exam: Filed Vitals:   10/08/11 0630 10/08/11 1525 10/08/11 2058 10/09/11 0515  BP: 151/57 150/69 146/66 147/72  Pulse: 89 94 85 79  Temp: 98.1 F (36.7 C) 98 F (36.7 C) 98.2 F (36.8 C) 98.8 F (37.1 C)  TempSrc: Oral  Oral Oral  Resp: 18 18 18 18   Height:      Weight:      SpO2: 96% 100% 98% 100%    General: Comfortable, alert, communicative, fully oriented, not short of  breath at rest.  HEENT: Mild clinical pallor, no jaundice, no conjunctival injection or discharge. Hydration status is fair.  NECK: Supple, JVP not seen, no carotid bruits, no palpable  lymphadenopathy, no palpable goiter.  CHEST: Clinically clear to auscultation, no wheezes, no crackles.  HEART: Sounds 1 and 2 heard, normal, regular, no murmurs.  ABDOMEN: Obese, soft, non-tender, no palpable organomegaly, no palpable masses, normal bowel sounds.  GENITALIA: Not examined.  LOWER EXTREMITIES: Minimal pitting edema, palpable peripheral pulses. 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 On the remainder of the foot, ulcers are now no longer purulent-appearing, and dressings are dry. Left LE reveals practically resolved erythema of foot, sores have dried up.  MUSCULOSKELETAL SYSTEM: Generalized osteoarthritic changes, otherwise, normal.  CENTRAL NERVOUS SYSTEM: Has pronounced LLE weakness.   Discharge Instructions      Discharge Orders    Future Appointments: Provider: Department: Dept Phone: Center:   10/15/2011 11:00 AM Oneita Hurt, MD [facility] [phone] None     Future Orders Please Complete By Expires   Diet - low sodium heart healthy      Diet Carb Modified      Increase activity slowly          Medication List     As of 10/09/2011 10:58 AM    STOP taking these medications         furosemide 80 MG tablet   Commonly known as: LASIX      traMADol 50 MG tablet   Commonly known as: ULTRAM      TAKE these medications         amLODipine 10 MG tablet   Commonly known as: NORVASC   Take 1 tablet (10 mg total) by mouth daily.      atorvastatin 20 MG tablet   Commonly known as: LIPITOR   Take 1 tablet (20 mg total) by mouth daily.      collagenase ointment   Commonly known as: SANTYL   Apply topically daily. Apply topically daily. Apply to right ischial tuberosity pressure ulcer (unstageable) daily.  Top with NS gauze, then dry gauze, secure with tape.      dexamethasone 4 MG tablet   Commonly known as: DECADRON   Take 4 mg by mouth 2 (two) times daily with a meal.      docusate sodium 100 MG capsule   Commonly known as: COLACE     Take 100 mg by mouth daily.      doxycycline 100 MG tablet   Commonly known as: VIBRA-TABS   Take 1 tablet (100 mg total) by mouth every 12 (twelve) hours.      folic acid 1 MG tablet   Commonly known as: FOLVITE   Take 1 tablet (1 mg total) by mouth daily.      HYDROmorphone 4 MG tablet   Commonly known as: DILAUDID   Take 4 mg by mouth every 6 (six) hours as needed. PAIN      insulin glargine 100 UNIT/ML injection   Commonly known as: LANTUS   Inject 50 Units into the skin 2 (two) times daily.      Ipratropium-Albuterol 20-100 MCG/ACT Aers respimat   Commonly known as: COMBIVENT   Inhale 1 puff into the lungs every 6 (six) hours as needed for wheezing or shortness of breath.      lisinopril 20 MG tablet   Commonly known as: PRINIVIL,ZESTRIL   Take 1 tablet (20 mg total) by mouth daily.  metroNIDAZOLE 500 MG tablet   Commonly known as: FLAGYL   Take 1 tablet (500 mg total) by mouth every 8 (eight) hours.      multivitamin with minerals Tabs   Take 1 tablet by mouth daily.      omeprazole 20 MG capsule   Commonly known as: PRILOSEC   Take 1 capsule (20 mg total) by mouth daily.      oxyCODONE 20 MG 12 hr tablet   Commonly known as: OXYCONTIN   Take 1 tablet (20 mg total) by mouth every 12 (twelve) hours.      thiamine 100 MG tablet   Take 1 tablet (100 mg total) by mouth daily.      traZODone 50 MG tablet   Commonly known as: DESYREL   Take 50 mg by mouth at bedtime.      vitamin B-12 1000 MCG tablet   Commonly known as: CYANOCOBALAMIN   Take 1,000 mcg by mouth daily.         Follow-up Information    Follow up with Oneita Hurt, MD. On 10/15/2011. (1100 AM)    Contact information:   [address] [place] [address] [phone]       Follow up with Florentina Jenny, MD. (As needed)    Contact information:   [address] [place] [address] [phone]           The results of significant diagnostics from this  hospitalization (including imaging, microbiology, ancillary and laboratory) are listed below for reference.    Significant Diagnostic Studies: Ct Biopsy  09/14/2011  *RADIOLOGY REPORT*  Clinical history:Multiple bone lesions without a primary diagnosis.  PROCEDURE(S): CT GUIDED BIOPSY OF THE LEFT ILIAC BONE LESION.  Physician: Rachelle Hora. Henn, MD  Medications:Versed 2 mg, Fentanyl 200 mcg. A radiology nurse monitored the patient for moderate sedation.  Moderate sedation time:30 minutes  Procedure:The procedure was explained to the patient.  The risks and benefits of the procedure were discussed and the patient's questions were addressed.  Informed consent was obtained from the patient.  The patient was placed in a right lateral decubitus position.  Images of the pelvis were obtained.  A lucent lesion involving the posterior left iliac bone was targeted.  The back was prepped and draped in a sterile fashion.  Skin and posterior bone cortex were anesthetized with lidocaine.  11 gauge bone needle was directed in the left iliac bone with CT guidance.  Three aspirates were obtained.  Two core biopsies were performed with the 11 gauge needle.  Findings:The bone are very heterogeneous and concerning for diffuse metastatic disease.   Lesion in posterior left iliac bone was targeted.  Complications: None  Impression:CT guided aspirations and core biopsies of the posterior left iliac bone lesion.   Original Report Authenticated By: Richarda Overlie, M.D. ( 09/14/2011 11:28:14 )     Microbiology: No results found for this or any previous visit (from the past 240 hour(s)).   Labs: Basic Metabolic Panel:  Lab 10/09/11 6962 10/08/11 0342 10/07/11 0330 10/06/11 0337 10/05/11 2050  NA 134* 132* 131* 132* 132*  K 3.8 3.8 3.7 3.3* 3.1*  CL 97 95* 94* 91* 89*  CO2 29 31 32 34* 37*  GLUCOSE 162* 385* 145* 267* 213*  BUN 15 16 18 19 17   CREATININE 0.53 0.58 0.70 0.67 0.64  CALCIUM 8.8 8.7 8.5 8.3* 9.6  MG -- -- -- -- --    PHOS -- -- -- -- --  Liver Function Tests:  Lab 10/08/11 0342 10/06/11 0337 10/05/11 2050  AST 17 11 15   ALT 25 23 28   ALKPHOS 484* 527* 642*  BILITOT 0.2* 0.2* 0.4  PROT 5.2* 4.9* 6.3  ALBUMIN 1.9* 2.0* 2.6*   No results found for this basename: LIPASE:5,AMYLASE:5 in the last 168 hours No results found for this basename: AMMONIA:5 in the last 168 hours CBC:  Lab 10/09/11 0415 10/08/11 0342 10/06/11 0337 10/05/11 2050  WBC 7.6 7.4 6.5 7.4  NEUTROABS -- -- 5.2 6.2  HGB 12.6* 11.6* 11.6* 13.7  HCT 37.6* 35.1* 34.7* 40.6  MCV 84.7 85.0 85.3 85.3  PLT 119* 101* 103* 124*   Cardiac Enzymes: No results found for this basename: CKTOTAL:5,CKMB:5,CKMBINDEX:5,TROPONINI:5 in the last 168 hours BNP: BNP (last 3 results) No results found for this basename: PROBNP:3 in the last 8760 hours CBG:  Lab 10/09/11 0808 10/08/11 2056 10/08/11 1700 10/08/11 1158 10/08/11 0731  GLUCAP 200* 201* 294* 489* 390*    Time coordinating discharge: 45 minutes  Signed:  Birch Farino,CHRISTOPHER  [facility] 10/09/2011, 10:58 AM

## 2011-10-09 NOTE — Progress Notes (Signed)
Mr Bradley [name] discharged to home via ambulance, [facility] contacted to continue wound dressings at home. IV d/c'ed prior to leaving.  Beryl Meager

## 2011-10-11 ENCOUNTER — Inpatient Hospital Stay (HOSPITAL_COMMUNITY)
Admission: EM | Admit: 2011-10-11 | Discharge: 2011-10-15 | DRG: 682 | Disposition: A | Discharge status: Skilled Nursing Facility | Payer: Medicare Other | Attending: Internal Medicine | Admitting: Internal Medicine

## 2011-10-11 ENCOUNTER — Encounter (HOSPITAL_COMMUNITY): Payer: Self-pay | Admitting: *Deleted

## 2011-10-11 ENCOUNTER — Emergency Department (HOSPITAL_COMMUNITY): Payer: Medicare Other

## 2011-10-11 DIAGNOSIS — C801 Malignant (primary) neoplasm, unspecified: Secondary | ICD-10-CM | POA: Diagnosis present

## 2011-10-11 DIAGNOSIS — D72829 Elevated white blood cell count, unspecified: Secondary | ICD-10-CM

## 2011-10-11 DIAGNOSIS — Z794 Long term (current) use of insulin: Secondary | ICD-10-CM

## 2011-10-11 DIAGNOSIS — A419 Sepsis, unspecified organism: Secondary | ICD-10-CM | POA: Diagnosis present

## 2011-10-11 DIAGNOSIS — E11621 Type 2 diabetes mellitus with foot ulcer: Secondary | ICD-10-CM | POA: Diagnosis present

## 2011-10-11 DIAGNOSIS — G9340 Encephalopathy, unspecified: Secondary | ICD-10-CM | POA: Diagnosis present

## 2011-10-11 DIAGNOSIS — S0993XA Unspecified injury of face, initial encounter: Secondary | ICD-10-CM | POA: Diagnosis present

## 2011-10-11 DIAGNOSIS — R748 Abnormal levels of other serum enzymes: Secondary | ICD-10-CM

## 2011-10-11 DIAGNOSIS — C7951 Secondary malignant neoplasm of bone: Secondary | ICD-10-CM | POA: Diagnosis present

## 2011-10-11 DIAGNOSIS — R627 Adult failure to thrive: Secondary | ICD-10-CM | POA: Diagnosis present

## 2011-10-11 DIAGNOSIS — Z66 Do not resuscitate: Secondary | ICD-10-CM | POA: Diagnosis present

## 2011-10-11 DIAGNOSIS — F17200 Nicotine dependence, unspecified, uncomplicated: Secondary | ICD-10-CM | POA: Diagnosis present

## 2011-10-11 DIAGNOSIS — L97509 Non-pressure chronic ulcer of other part of unspecified foot with unspecified severity: Secondary | ICD-10-CM | POA: Diagnosis present

## 2011-10-11 DIAGNOSIS — L03116 Cellulitis of left lower limb: Secondary | ICD-10-CM | POA: Diagnosis present

## 2011-10-11 DIAGNOSIS — E1169 Type 2 diabetes mellitus with other specified complication: Secondary | ICD-10-CM | POA: Diagnosis present

## 2011-10-11 DIAGNOSIS — D649 Anemia, unspecified: Secondary | ICD-10-CM | POA: Diagnosis present

## 2011-10-11 DIAGNOSIS — J9600 Acute respiratory failure, unspecified whether with hypoxia or hypercapnia: Secondary | ICD-10-CM | POA: Diagnosis present

## 2011-10-11 DIAGNOSIS — H04123 Dry eye syndrome of bilateral lacrimal glands: Secondary | ICD-10-CM

## 2011-10-11 DIAGNOSIS — Z79899 Other long term (current) drug therapy: Secondary | ICD-10-CM

## 2011-10-11 DIAGNOSIS — I951 Orthostatic hypotension: Secondary | ICD-10-CM

## 2011-10-11 DIAGNOSIS — M6282 Rhabdomyolysis: Secondary | ICD-10-CM | POA: Diagnosis present

## 2011-10-11 DIAGNOSIS — E860 Dehydration: Secondary | ICD-10-CM | POA: Diagnosis present

## 2011-10-11 DIAGNOSIS — E162 Hypoglycemia, unspecified: Secondary | ICD-10-CM | POA: Diagnosis present

## 2011-10-11 DIAGNOSIS — W19XXXA Unspecified fall, initial encounter: Secondary | ICD-10-CM

## 2011-10-11 DIAGNOSIS — K219 Gastro-esophageal reflux disease without esophagitis: Secondary | ICD-10-CM | POA: Diagnosis present

## 2011-10-11 DIAGNOSIS — R29898 Other symptoms and signs involving the musculoskeletal system: Secondary | ICD-10-CM

## 2011-10-11 DIAGNOSIS — C7952 Secondary malignant neoplasm of bone marrow: Secondary | ICD-10-CM | POA: Diagnosis present

## 2011-10-11 DIAGNOSIS — M545 Low back pain: Secondary | ICD-10-CM

## 2011-10-11 DIAGNOSIS — F1010 Alcohol abuse, uncomplicated: Secondary | ICD-10-CM | POA: Diagnosis present

## 2011-10-11 DIAGNOSIS — L0390 Cellulitis, unspecified: Secondary | ICD-10-CM

## 2011-10-11 DIAGNOSIS — I10 Essential (primary) hypertension: Secondary | ICD-10-CM | POA: Diagnosis present

## 2011-10-11 DIAGNOSIS — S199XXA Unspecified injury of neck, initial encounter: Secondary | ICD-10-CM

## 2011-10-11 DIAGNOSIS — D630 Anemia in neoplastic disease: Secondary | ICD-10-CM | POA: Diagnosis present

## 2011-10-11 DIAGNOSIS — N179 Acute kidney failure, unspecified: Secondary | ICD-10-CM | POA: Diagnosis present

## 2011-10-11 DIAGNOSIS — E785 Hyperlipidemia, unspecified: Secondary | ICD-10-CM

## 2011-10-11 DIAGNOSIS — M549 Dorsalgia, unspecified: Secondary | ICD-10-CM

## 2011-10-11 DIAGNOSIS — E119 Type 2 diabetes mellitus without complications: Secondary | ICD-10-CM | POA: Diagnosis present

## 2011-10-11 DIAGNOSIS — Z720 Tobacco use: Secondary | ICD-10-CM

## 2011-10-11 LAB — CBC WITH DIFFERENTIAL/PLATELET
Basophils Relative: 0 % (ref 0–1)
Eosinophils Absolute: 0 10*3/uL (ref 0.0–0.7)
HCT: 37.5 % — ABNORMAL LOW (ref 39.0–52.0)
Hemoglobin: 12.7 g/dL — ABNORMAL LOW (ref 13.0–17.0)
MCH: 29.1 pg (ref 26.0–34.0)
MCHC: 33.9 g/dL (ref 30.0–36.0)
Monocytes Absolute: 0.6 10*3/uL (ref 0.1–1.0)
Monocytes Relative: 6 % (ref 3–12)
Neutro Abs: 8.1 10*3/uL — ABNORMAL HIGH (ref 1.7–7.7)

## 2011-10-11 LAB — GLUCOSE, CAPILLARY: Glucose-Capillary: 47 mg/dL — ABNORMAL LOW (ref 70–99)

## 2011-10-11 LAB — CBC
HCT: 37 % — ABNORMAL LOW (ref 39.0–52.0)
Hemoglobin: 12.5 g/dL — ABNORMAL LOW (ref 13.0–17.0)
MCH: 28.5 pg (ref 26.0–34.0)
MCHC: 33.8 g/dL (ref 30.0–36.0)
RBC: 4.39 MIL/uL (ref 4.22–5.81)

## 2011-10-11 LAB — PROTIME-INR
INR: 1.2 (ref 0.00–1.49)
Prothrombin Time: 15.5 seconds — ABNORMAL HIGH (ref 11.6–15.2)

## 2011-10-11 LAB — POCT I-STAT TROPONIN I

## 2011-10-11 LAB — CREATININE, SERUM
Creatinine, Ser: 2.31 mg/dL — ABNORMAL HIGH (ref 0.50–1.35)
GFR calc non Af Amer: 28 mL/min — ABNORMAL LOW (ref >90)

## 2011-10-11 LAB — PROCALCITONIN: Procalcitonin: 1.58 ng/mL

## 2011-10-11 LAB — COMPREHENSIVE METABOLIC PANEL
Albumin: 1.7 g/dL — ABNORMAL LOW (ref 3.5–5.2)
BUN: 70 mg/dL — ABNORMAL HIGH (ref 6–23)
Chloride: 98 mEq/L (ref 96–112)
Creatinine, Ser: 2.88 mg/dL — ABNORMAL HIGH (ref 0.50–1.35)
GFR calc Af Amer: 25 mL/min — ABNORMAL LOW (ref >90)
Glucose, Bld: 233 mg/dL — ABNORMAL HIGH (ref 70–99)
Total Bilirubin: 0.2 mg/dL — ABNORMAL LOW (ref 0.3–1.2)

## 2011-10-11 LAB — TROPONIN I: Troponin I: <0.3 ng/mL (ref <0.30)

## 2011-10-11 MED ORDER — PANTOPRAZOLE SODIUM 40 MG PO TBEC
40.0000 mg | DELAYED_RELEASE_TABLET | Freq: Every day | ORAL | Status: DC
  Administered 2011-10-12 – 2011-10-14 (×3): 40 mg via ORAL
  Filled 2011-10-11 (×3): qty 1

## 2011-10-11 MED ORDER — ENOXAPARIN SODIUM 30 MG/0.3ML ~~LOC~~ SOLN
30.0000 mg | SUBCUTANEOUS | Status: DC
  Administered 2011-10-11: 30 mg via SUBCUTANEOUS
  Filled 2011-10-11 (×2): qty 0.3

## 2011-10-11 MED ORDER — DEXTROSE 50 % IV SOLN
25.0000 g | Freq: Once | INTRAVENOUS | Status: AC
  Administered 2011-10-11: 25 g via INTRAVENOUS

## 2011-10-11 MED ORDER — VITAMIN B-1 100 MG PO TABS
100.0000 mg | ORAL_TABLET | Freq: Every day | ORAL | Status: DC
  Administered 2011-10-11 – 2011-10-15 (×5): 100 mg via ORAL
  Filled 2011-10-11 (×5): qty 1

## 2011-10-11 MED ORDER — PIPERACILLIN-TAZOBACTAM 3.375 G IVPB
3.3750 g | Freq: Three times a day (TID) | INTRAVENOUS | Status: DC
  Administered 2011-10-11 – 2011-10-12 (×4): 3.375 g via INTRAVENOUS
  Filled 2011-10-11 (×5): qty 50

## 2011-10-11 MED ORDER — OXYCODONE HCL 10 MG PO TB12
20.0000 mg | ORAL_TABLET | Freq: Two times a day (BID) | ORAL | Status: DC
  Administered 2011-10-11 – 2011-10-15 (×8): 20 mg via ORAL
  Filled 2011-10-11 (×3): qty 2
  Filled 2011-10-11 (×2): qty 1
  Filled 2011-10-11 (×4): qty 2

## 2011-10-11 MED ORDER — ONDANSETRON HCL 4 MG/2ML IJ SOLN: 4.0000 mg | Freq: Four times a day (QID) | INTRAMUSCULAR | Status: DC | PRN

## 2011-10-11 MED ORDER — ADULT MULTIVITAMIN W/MINERALS CH
1.0000 | ORAL_TABLET | Freq: Every day | ORAL | Status: DC
  Administered 2011-10-11 – 2011-10-15 (×5): 1 via ORAL
  Filled 2011-10-11 (×5): qty 1

## 2011-10-11 MED ORDER — ONDANSETRON HCL 4 MG PO TABS: 4.0000 mg | ORAL_TABLET | Freq: Four times a day (QID) | ORAL | Status: DC | PRN

## 2011-10-11 MED ORDER — SODIUM CHLORIDE 0.9 % IV SOLN: INTRAVENOUS | Status: DC

## 2011-10-11 MED ORDER — DOCUSATE SODIUM 100 MG PO CAPS
100.0000 mg | ORAL_CAPSULE | Freq: Every day | ORAL | Status: DC
  Administered 2011-10-11 – 2011-10-15 (×5): 100 mg via ORAL
  Filled 2011-10-11 (×5): qty 1

## 2011-10-11 MED ORDER — DEXTROSE-NACL 5-0.9 % IV SOLN
INTRAVENOUS | Status: DC
  Administered 2011-10-11 – 2011-10-12 (×2): via INTRAVENOUS

## 2011-10-11 MED ORDER — VANCOMYCIN HCL IN DEXTROSE 1-5 GM/200ML-% IV SOLN
1000.0000 mg | Freq: Once | INTRAVENOUS | Status: AC
  Administered 2011-10-11: 1000 mg via INTRAVENOUS
  Filled 2011-10-11: qty 200

## 2011-10-11 MED ORDER — DEXTROSE 50 % IV SOLN
INTRAVENOUS | Status: AC
  Filled 2011-10-11: qty 50

## 2011-10-11 MED ORDER — SENNOSIDES-DOCUSATE SODIUM 8.6-50 MG PO TABS
1.0000 | ORAL_TABLET | Freq: Every evening | ORAL | Status: DC | PRN
  Filled 2011-10-11 (×2): qty 1

## 2011-10-11 MED ORDER — SODIUM CHLORIDE 0.9 % IV BOLUS (SEPSIS)
1000.0000 mL | Freq: Once | INTRAVENOUS | Status: AC
  Administered 2011-10-11: 1000 mL via INTRAVENOUS

## 2011-10-11 MED ORDER — IPRATROPIUM-ALBUTEROL 20-100 MCG/ACT IN AERS: 1.0000 | INHALATION_SPRAY | Freq: Four times a day (QID) | RESPIRATORY_TRACT | Status: DC | PRN

## 2011-10-11 MED ORDER — HYDROMORPHONE HCL 4 MG PO TABS: 4.0000 mg | ORAL_TABLET | Freq: Four times a day (QID) | ORAL | Status: DC | PRN

## 2011-10-11 MED ORDER — VANCOMYCIN HCL IN DEXTROSE 1-5 GM/200ML-% IV SOLN
1000.0000 mg | INTRAVENOUS | Status: DC
  Administered 2011-10-11: 1000 mg via INTRAVENOUS
  Filled 2011-10-11 (×2): qty 200

## 2011-10-11 MED ORDER — VITAMIN B-12 1000 MCG PO TABS
1000.0000 ug | ORAL_TABLET | Freq: Every day | ORAL | Status: DC
  Administered 2011-10-11 – 2011-10-15 (×5): 1000 ug via ORAL
  Filled 2011-10-11 (×5): qty 1

## 2011-10-11 MED ORDER — DEXTROSE 50 % IV SOLN
INTRAVENOUS | Status: AC
  Administered 2011-10-11: 50 mL
  Filled 2011-10-11: qty 50

## 2011-10-11 MED ORDER — DEXAMETHASONE 4 MG PO TABS
4.0000 mg | ORAL_TABLET | Freq: Two times a day (BID) | ORAL | Status: DC
  Administered 2011-10-11 – 2011-10-15 (×9): 4 mg via ORAL
  Filled 2011-10-11 (×10): qty 1

## 2011-10-11 MED ORDER — FOLIC ACID 1 MG PO TABS
1.0000 mg | ORAL_TABLET | Freq: Every day | ORAL | Status: DC
  Administered 2011-10-11 – 2011-10-15 (×5): 1 mg via ORAL
  Filled 2011-10-11 (×5): qty 1

## 2011-10-11 MED ORDER — ATORVASTATIN CALCIUM 20 MG PO TABS
20.0000 mg | ORAL_TABLET | Freq: Every day | ORAL | Status: DC
  Administered 2011-10-11 – 2011-10-14 (×4): 20 mg via ORAL
  Filled 2011-10-11 (×5): qty 1

## 2011-10-11 MED ORDER — DEXTROSE 50 % IV SOLN: 25.0000 g | Freq: Once | INTRAVENOUS | Status: DC

## 2011-10-11 NOTE — ED Notes (Signed)
CBG 66, Update called to 2600

## 2011-10-11 NOTE — H&P (Addendum)
[facility]          History and Physical    PCP:   Florentina Jenny, MD   Chief Complaint:  Syncope, hypoglycemia  HPI: 66 y/o man with PMH significant for metastatic cancer to the bone of unknown primary, DM, ETOH abuse, among other things, recently discharged from [facility] with cellulitis, returns today after suffering a fall and a syncopal event at home. Patient has no recollection of the events. Upon EMS arrival his CBG was in the 75s. He has continued to be hypoglycemic in the hospital. He had a BP in the 70s initially that is now in the 100s with fluid resucitation. Lactic acid is 2.8, he is found to be in ARF with a cr of 2.88, that was 0.5 3 days ago. He also has a CK level of >8000 c/w rhabdomyolysis. We are asked to admit him for further evaluation and management.  Allergies:   Allergies  Allergen Reactions  . Penicillins     Pt reports he is allergic to all antibiotics. States that he "cannot take antibiotics because they make him go to the bathroom a lot and I won't take them."       Past Medical History  Diagnosis Date  . Hypertension   . Diabetes mellitus   . Back pain   . Cancer   . GERD (gastroesophageal reflux disease)     Past Surgical History  Procedure Date  . Total hip arthroplasty   . Toe resections     Prior to Admission medications   Medication Sig Start Date End Date Taking? Authorizing Provider  amLODipine (NORVASC) 10 MG tablet Take 1 tablet (10 mg total) by mouth daily. 10/09/11 10/08/12 Yes Laveda Norman, MD  atorvastatin (LIPITOR) 20 MG tablet Take 1 tablet (20 mg total) by mouth daily. 10/09/11 10/08/12 Yes Laveda Norman, MD  collagenase (SANTYL) ointment Apply topically daily. Apply topically daily. Apply to right ischial tuberosity pressure ulcer (unstageable) daily.  Top with NS gauze, then dry gauze, secure with tape. 10/09/11 10/19/11 Yes Laveda Norman, MD  dexamethasone (DECADRON) 4 MG tablet Take 4 mg by mouth 2 (two) times daily  with a meal.   Yes Historical Provider, MD  docusate sodium (COLACE) 100 MG capsule Take 100 mg by mouth daily.   Yes Historical Provider, MD  doxycycline (VIBRA-TABS) 100 MG tablet Take 1 tablet (100 mg total) by mouth every 12 (twelve) hours. 10/09/11 10/19/11 Yes Laveda Norman, MD  folic acid (FOLVITE) 1 MG tablet Take 1 tablet (1 mg total) by mouth daily. 10/09/11 10/08/12 Yes Laveda Norman, MD  HYDROmorphone (DILAUDID) 4 MG tablet Take 4 mg by mouth every 6 (six) hours as needed. PAIN   Yes Historical Provider, MD  insulin glargine (LANTUS SOLOSTAR) 100 UNIT/ML injection Inject 50 Units into the skin 2 (two) times daily. 09/15/11 09/14/12 Yes Vassie Loll, MD  Ipratropium-Albuterol (COMBIVENT) 20-100 MCG/ACT AERS respimat Inhale 1 puff into the lungs every 6 (six) hours as needed for wheezing or shortness of breath. 09/15/11  Yes Vassie Loll, MD  lisinopril (PRINIVIL,ZESTRIL) 20 MG tablet Take 1 tablet (20 mg total) by mouth daily. 10/09/11 10/08/12 Yes Laveda Norman, MD  metroNIDAZOLE (FLAGYL) 500 MG tablet Take 1 tablet (500 mg total) by mouth every 8 (eight) hours. 10/09/11 10/19/11 Yes Laveda Norman, MD  Multiple Vitamin (MULTIVITAMIN WITH MINERALS) TABS Take 1 tablet by mouth daily. 10/09/11  Yes Laveda Norman, MD  omeprazole (PRILOSEC) 20 MG  capsule Take 1 capsule (20 mg total) by mouth daily. 08/27/11 08/26/12 Yes Nishant Dhungel, MD  oxyCODONE (OXYCONTIN) 20 MG 12 hr tablet Take 1 tablet (20 mg total) by mouth every 12 (twelve) hours. 09/15/11  Yes Vassie Loll, MD  thiamine 100 MG tablet Take 1 tablet (100 mg total) by mouth daily. 10/09/11 10/08/12 Yes Laveda Norman, MD  traZODone (DESYREL) 50 MG tablet Take 50 mg by mouth at bedtime.   Yes Historical Provider, MD  vitamin B-12 (CYANOCOBALAMIN) 1000 MCG tablet Take 1,000 mcg by mouth daily.   Yes Historical Provider, MD    Social History:  reports that he has been smoking Cigarettes.  He has a 24.5 pack-year smoking history. He has never used smokeless  tobacco. He reports that he drinks alcohol. He reports that he does not use illicit drugs.  History reviewed. No pertinent family history.  Review of Systems:  Constitutional: Denies fever, chills, diaphoresis.  HEENT: Denies photophobia, eye pain, redness, hearing loss, ear pain, congestion, sore throat, rhinorrhea, sneezing, mouth sores, trouble swallowing, neck pain, neck stiffness and tinnitus.   Respiratory: Denies SOB, DOE, cough, chest tightness,  and wheezing.   Cardiovascular: Denies chest pain, palpitations. Gastrointestinal: Denies nausea, vomiting, abdominal pain, diarrhea, constipation, blood in stool and abdominal distention.  Genitourinary: Denies dysuria, urgency, frequency, hematuria, flank pain and difficulty urinating.  Musculoskeletal: Denies joint swelling, arthralgias. Skin: Denies pallor. Neurological: Denies dizziness, seizures, syncope, weakness, light-headedness, numbness and headaches.  Hematological: Denies adenopathy. Easy bruising, personal or family bleeding history  Psychiatric/Behavioral: Denies suicidal ideation, mood changes, confusion, nervousness, sleep disturbance and agitation   Physical Exam: Blood pressure 135/71, pulse 104, resp. rate 18, SpO2 98.00%. Gen: AAOx3, unkempt, disheveled HEENT: [place], trauma and swelling to the left forehead and eye, dry mucous membranes Neck: supple, no JVD, no LAD, no bruits, no goiter. CV: tachy, regular, no M/R/G Lungs: CTA B Abd: S/NT/ND/+BS/no masses or organomegaly noted. Ext: right forefoot amputation, left large full-thickness ulcer to dorsum of foot with mild surrounding erythema. Neuro: moves all four, mentating appropriately.  Labs on Admission:  Results for orders placed during the hospital encounter of 10/11/11 (from the past 48 hour(s))  GLUCOSE, CAPILLARY     Status: Abnormal   Collection Time   10/11/11  1:42 PM      Component Value Range Comment   Glucose-Capillary 53 (*) 70 - 99 mg/dL   CBC WITH  DIFFERENTIAL     Status: Abnormal   Collection Time   10/11/11  1:47 PM      Component Value Range Comment   WBC 9.2  4.0 - 10.5 K/uL    RBC 4.37  4.22 - 5.81 MIL/uL    Hemoglobin 12.7 (*) 13.0 - 17.0 g/dL    HCT 21.3 (*) 08.6 - 52.0 %    MCV 85.8  78.0 - 100.0 fL    MCH 29.1  26.0 - 34.0 pg    MCHC 33.9  30.0 - 36.0 g/dL    RDW 57.8  46.9 - 62.9 %    Platelets 139 (*) 150 - 400 K/uL    Neutrophils Relative 88 (*) 43 - 77 %    Neutro Abs 8.1 (*) 1.7 - 7.7 K/uL    Lymphocytes Relative 6 (*) 12 - 46 %    Lymphs Abs 0.5 (*) 0.7 - 4.0 K/uL    Monocytes Relative 6  3 - 12 %    Monocytes Absolute 0.6  0.1 - 1.0 K/uL    Eosinophils  Relative 0  0 - 5 %    Eosinophils Absolute 0.0  0.0 - 0.7 K/uL    Basophils Relative 0  0 - 1 %    Basophils Absolute 0.0  0.0 - 0.1 K/uL   COMPREHENSIVE METABOLIC PANEL     Status: Abnormal   Collection Time   10/11/11  1:47 PM      Component Value Range Comment   Sodium 137  135 - 145 mEq/L    Potassium 4.4  3.5 - 5.1 mEq/L    Chloride 98  96 - 112 mEq/L    CO2 25  19 - 32 mEq/L    Glucose, Bld 233 (*) 70 - 99 mg/dL    BUN 70 (*) 6 - 23 mg/dL    Creatinine, Ser 6.44 (*) 0.50 - 1.35 mg/dL    Calcium 7.2 (*) 8.4 - 10.5 mg/dL    Total Protein 5.2 (*) 6.0 - 8.3 g/dL    Albumin 1.7 (*) 3.5 - 5.2 g/dL    AST 034 (*) 0 - 37 U/L    ALT 87 (*) 0 - 53 U/L    Alkaline Phosphatase 476 (*) 39 - 117 U/L    Total Bilirubin 0.2 (*) 0.3 - 1.2 mg/dL    GFR calc non Af Amer 21 (*) >90 mL/min    GFR calc Af Amer 25 (*) >90 mL/min   PROTIME-INR     Status: Abnormal   Collection Time   10/11/11  1:47 PM      Component Value Range Comment   Prothrombin Time 15.5 (*) 11.6 - 15.2 seconds    INR 1.20  0.00 - 1.49   ETHANOL     Status: Normal   Collection Time   10/11/11  1:47 PM      Component Value Range Comment   Alcohol, Ethyl (B) <11  0 - 11 mg/dL   CK     Status: Abnormal   Collection Time   10/11/11  1:47 PM      Component Value Range Comment   Total CK 8506  (*) 7 - 232 U/L   LACTIC ACID, PLASMA     Status: Abnormal   Collection Time   10/11/11  2:05 PM      Component Value Range Comment   Lactic Acid, Venous 2.8 (*) 0.5 - 2.2 mmol/L   POCT I-STAT TROPONIN I     Status: Abnormal   Collection Time   10/11/11  2:19 PM      Component Value Range Comment   Troponin i, poc 0.09 (*) 0.00 - 0.08 ng/mL    Comment NOTIFIED PHYSICIAN      Comment 3            GLUCOSE, CAPILLARY     Status: Abnormal   Collection Time   10/11/11  3:59 PM      Component Value Range Comment   Glucose-Capillary 47 (*) 70 - 99 mg/dL     Radiological Exams on Admission: Ct Head Wo Contrast  10/11/2011  *RADIOLOGY REPORT*  Clinical Data: Altered mental status.  Found down with hypoglycemia.  CT HEAD WITHOUT CONTRAST  Technique:  Contiguous axial images were obtained from the base of the skull through the vertex without contrast.  Comparison: Head CT 11/29/2008.  Findings: There is stable chronic encephalomalacia inferiorly in the right frontal lobe.  No acute intracranial hemorrhage, mass lesion, brain edema or extra-axial fluid collection is seen. Atrophy appears mildly progressive.  There is no evidence of  acute stroke.  There are diffuse intracranial vascular calcifications.  The visualized paranasal sinuses, mastoids and middle ears are clear. The patient has developed multiple ill-defined calvarial lucencies, especially within the right frontal and the left occipital bones. No fractures are identified.  IMPRESSION:  1.  No acute intracranial findings identified. 2.  Stable right frontal lobe encephalomalacia.  Mildly progressive atrophy. 3.  Interval development of numerous calvarial lucencies concerning for possible multiple myeloma or metastatic disease.    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   Original Report Authenticated By: Gerrianne Scale, M.D.    Dg Pelvis Portable  10/11/2011   *RADIOLOGY REPORT*  Clinical Data: Pain  PORTABLE PELVIS  Comparison: 04/24/2007  Findings: Left hip hemiarthroplasty is in place.  No breakage or loosening of the hardware.  Heterotopic ossification has developed about the left greater trochanter. Moderate osteopenia.  No obvious fracture or dislocation.  Vascular calcifications are noted. There is a lytic lesion involving the right superior pubic ramus with loss of the cortex superiorly. There is a focal sclerosis involving the right ischium worrisome for sclerotic lesion.  IMPRESSION: Postoperative and chronic changes.  No evidence of acute fracture.  Destructive bone lesion involving the right superior pubic ramus. There is also likely a sclerotic bone lesion in the right ischium.   Original Report Authenticated By: Donavan Burnet, M.D.    Dg Chest Port 1 View  10/11/2011  *RADIOLOGY REPORT*  Clinical Data: Chest pain and weakness after falling at home. History of hypertension and diabetes.  Metastatic bone cancer.  PORTABLE CHEST - 1 VIEW  Comparison: Radiographs 08/24/2011.  CT 08/25/2011.  Findings: 1408 hours.  The heart size and mediastinal contours are stable.  There is stable pleural parenchymal scarring at the left costophrenic angle.  No airspace disease or significant pleural effusion is identified.  A lucent lesion and associated pathologic fracture of the left fifth rib appear unchanged.  No new fractures are identified.  IMPRESSION: Stable pleural parenchymal scarring in the left hemithorax.  No acute process identified.   Original Report Authenticated By: Gerrianne Scale, M.D.     Assessment/Plan Active Problems:  DM (diabetes mellitus)  Metastatic cancer to bone  ETOH abuse  ARF (acute renal failure)  Sepsis  Rhabdomyolysis  Hypoglycemia  Diabetic foot ulcers  Encephalopathy acute  Facial trauma   Sepsis -Lactic acid 2.8 -Hypotension improved with IVF but SBP was in the 70s on admission. -Suspect source is lower extremity  ulcers. -Vanc/aztreonam (has a PCN allergy) -Blood cx x 2. -Admit to SDU. -Check procalcitonin -Continue fluid resuscitation.  ARF -Had normal renal function upon DC 2 days ago. -Cr now 2.88. -2/2 sepsis and decreased intravascular volume as well as rhabdo. -Give IVF. -Follow renal function in am.  Rhabdomyolysis -Likely from lying on the floor for a prolonged period of time. -CPK >8000. Elevated troponin, likely due to rhabdo, will cycle. -IVF.  Hypoglycemia -Likely secondary to decreased renal clearance of insulin given ARF plus possibly decreased PO intake. -Patient states he has continued to take his insulin. -Discontinue all insulin. -Check CBG q 2 hours until stable. -Place on a D5 drip.  Encephalopathy -Improving. -Mainly 2/2 hypoglycemia, but sepsis could be playing a role as well.  Diabetic foot ulcers -Appear to be full thickness. -Will ask wound care to follow for dressing changes. -Need to address definitive plan.  DVT Prophylaxis -Lovenox.  Facial Trauma -Secondary to fall  with hypoglycemia. -CT scan head is negative for injury.  ETOH abuse -Thiamine/folate. ETOH level neg on admission.  Disposition -Need to readdress with Palliative Care in the am his goals of care: do we keep readmitting this gentleman for repeated courses of antibiotics, should we amputate, should he be a full-blown hospice patient? -Note he refused an MRI his prior hospitalization.  Time Spent on Admission: 70 minutes  HERNANDEZ ACOSTA,ESTELA [facility] Pager: [phone] 10/11/2011, 4:44 PM

## 2011-10-11 NOTE — ED Provider Notes (Signed)
History     CSN: [id number]  Arrival date & time 10/11/11  1252   First MD Initiated Contact with Patient 10/11/11 1316      Chief Complaint  Patient presents with  . Hypoglycemia  . Fall  . Facial Swelling    Patient is a 66 y.o. male presenting with fall. The history is provided by the patient and the EMS personnel.  Fall Incident onset: earlier today. Point of impact: unknown. He was not ambulatory at the scene. Associated symptoms include loss of consciousness. Pertinent negatives include no fever, no abdominal pain and no vomiting. The symptoms are aggravated by activity. He has tried rest for the symptoms. The treatment provided mild relief.  pt presents after fall or possible syncopal event Pt was found under his chair by neighbor - estimated time down was 2 hours On arrival EMS reports his glucose was low.  He was given glucose with improvement in his symptoms Pt is now more awake He is unsure what happened, he is not sure why he fell. He denies any complaints at this time.  He reports chronic wounds to his lower extremities  He denies headache.  He denies any visual changes.    Past Medical History  Diagnosis Date  . Hypertension   . Diabetes mellitus   . Back pain   . Cancer   . GERD (gastroesophageal reflux disease)     Past Surgical History  Procedure Date  . Total hip arthroplasty   . Toe resections     History reviewed. No pertinent family history.  History  Substance Use Topics  . Smoking status: Current Some Day Smoker -- 0.5 packs/day for 49 years    Types: Cigarettes  . Smokeless tobacco: Never Used  . Alcohol Use: Yes     occassionally      Review of Systems  Constitutional: Negative for fever.  Gastrointestinal: Negative for vomiting and abdominal pain.  Neurological: Positive for loss of consciousness.  All other systems reviewed and are negative.    Allergies  Penicillins  Home Medications   Current Outpatient Rx  Name Route  Sig Dispense Refill  . AMLODIPINE BESYLATE 10 MG PO TABS Oral Take 1 tablet (10 mg total) by mouth daily. 30 tablet 1  . ATORVASTATIN CALCIUM 20 MG PO TABS Oral Take 1 tablet (20 mg total) by mouth daily. 30 tablet 1  . COLLAGENASE 250 UNIT/GM EX OINT Topical Apply topically daily. Apply topically daily. Apply to right ischial tuberosity pressure ulcer (unstageable) daily.  Top with NS gauze, then dry gauze, secure with tape. 15 g 1  . DEXAMETHASONE 4 MG PO TABS Oral Take 4 mg by mouth 2 (two) times daily with a meal.    . DOCUSATE SODIUM 100 MG PO CAPS Oral Take 100 mg by mouth daily.    Marland Kitchen DOXYCYCLINE HYCLATE 100 MG PO TABS Oral Take 1 tablet (100 mg total) by mouth every 12 (twelve) hours. 14 tablet 0  . FOLIC ACID 1 MG PO TABS Oral Take 1 tablet (1 mg total) by mouth daily. 30 tablet 0  . HYDROMORPHONE HCL 4 MG PO TABS Oral Take 4 mg by mouth every 6 (six) hours as needed. PAIN    . INSULIN GLARGINE 100 UNIT/ML [place] SOLN Subcutaneous Inject 50 Units into the skin 2 (two) times daily. 10 mL 0  . IPRATROPIUM-ALBUTEROL 20-100 MCG/ACT IN AERS Inhalation Inhale 1 puff into the lungs every 6 (six) hours as needed for wheezing or shortness of  breath. 1 Inhaler 1  . LISINOPRIL 20 MG PO TABS Oral Take 1 tablet (20 mg total) by mouth daily. 30 tablet 1  . METRONIDAZOLE 500 MG PO TABS Oral Take 1 tablet (500 mg total) by mouth every 8 (eight) hours. 21 tablet 0  . ADULT MULTIVITAMIN W/MINERALS CH Oral Take 1 tablet by mouth daily. 30 tablet 0  . OMEPRAZOLE 20 MG PO CPDR Oral Take 1 capsule (20 mg total) by mouth daily. 30 capsule 0  . OXYCODONE HCL ER 20 MG PO TB12 Oral Take 1 tablet (20 mg total) by mouth every 12 (twelve) hours. 60 tablet 0  . THIAMINE HCL 100 MG PO TABS Oral Take 1 tablet (100 mg total) by mouth daily. 30 tablet 0  . TRAZODONE HCL 50 MG PO TABS Oral Take 50 mg by mouth at bedtime.    Marland Kitchen VITAMIN B-12 1000 MCG PO TABS Oral Take 1,000 mcg by mouth daily.      BP 79/49  Pulse 103  SpO2  99%  Physical Exam CONSTITUTIONAL: disheveled, but in no distress HEAD AND FACE: Normocephalic/atraumatic.  Edema noted around left eye, but no bruising.  No crepitance.   EYES: EOMI.  Pupils are pinpoint.  Pt denies photophobia or eye pain ENMT: Mucous membranes dry NECK: supple no meningeal signs SPINE:entire spine nontender CV: S1/S2 noted, no murmurs/rubs/gallops noted LUNGS: coarse BS noted bilaterally, no distress noted ABDOMEN: soft, nontender, no rebound or guarding GU:no cva tenderness NEURO: Pt is awake/alert, he is in no distress. GCS 15 EXTREMITIES: right foot partial amputation noted.  Wound noted to dorsal aspect of left foot with localized erythema.  His foot is warm.   SKIN: warm, color normal PSYCH: no abnormalities of mood noted  ED Course  Procedures  CRITICAL CARE Performed by: Joya Gaskins   Total critical care time: 35  Critical care time was exclusive of separately billable procedures and treating other patients.  Critical care was necessary to treat or prevent imminent or life-threatening deterioration.  Critical care was time spent personally by me on the following activities: development of treatment plan with patient and/or surrogate as well as nursing, discussions with consultants, evaluation of patient's response to treatment, examination of patient, obtaining history from patient or surrogate, ordering and performing treatments and interventions, ordering and review of laboratory studies, ordering and review of radiographic studies, pulse oximetry and re-evaluation of patient's condition.   Labs Reviewed  GLUCOSE, CAPILLARY - Abnormal; Notable for the following:    Glucose-Capillary 53 (*)     All other components within normal limits  CBC WITH DIFFERENTIAL  COMPREHENSIVE METABOLIC PANEL  URINALYSIS, ROUTINE W REFLEX MICROSCOPIC  URINE CULTURE  PROTIME-INR  ETHANOL  URINE RAPID DRUG SCREEN (HOSP PERFORMED)  LACTIC ACID, PLASMA  CULTURE,  BLOOD (ROUTINE X 2)  CULTURE, BLOOD (ROUTINE X 2)  CK   2:59 PM Pt with reported fall, found on ground after 2 hours, found to be hyperglycemic.  He is now awake/alert, no new complaints, reports wounds on feet are at baseline.  Initially hypotensive but this resolved with IV fluids.  He denies active CP/SOB.  No new headache.  No abd pain reported.    3:50 PM Pt improved SBP improved Hypoglycemia, renal failure and rhabdo noted Will admit IV fluids started Will restart on abx for cellulitis to left foot D/w dr Ardyth Harps [facility] to admit    MDM  Nursing notes including past medical history and social history reviewed and considered in documentation xrays reviewed  and considered Labs/vital reviewed and considered Previous records reviewed and considered - recent admission for cellulitis        Date: 10/11/2011  Rate: 102  Rhythm: normal sinus rhythm  QRS Axis: left  Intervals: normal  ST/T Wave abnormalities: nonspecific ST changes  Conduction Disutrbances:none  Narrative Interpretation: artifact noted     Joya Gaskins, MD 10/11/11 1551

## 2011-10-11 NOTE — ED Notes (Signed)
Pt. Was found under his chair by neighbor.  When neighbor went to check on pt. Later he was still lyin gon the floor. EMS was called and pt. Had a BS of 29.  Pt. Was treated and became combative.  [employer] helped transport the pt.

## 2011-10-11 NOTE — Progress Notes (Signed)
Hypoglycemic Event  CBG: 56 Treatment: meal  Symptoms:asymptomatic  Follow-up CBG: Time: 2140 CBG Result:80  Possible Reasons for Event:   Comments/MD notified: Lenny Pastel    Bradley [name], Bradley [name]  Remember to initiate Hypoglycemia Order Set & complete

## 2011-10-11 NOTE — Progress Notes (Addendum)
ANTIBIOTIC CONSULT NOTE - INITIAL  Pharmacy Consult for Vancomycin and Zosyn Indication: sepsis, LE ulcers  Allergies  Allergen Reactions  . Penicillins     Pt reports he is allergic to all antibiotics. States that he "cannot take antibiotics because they make him go to the bathroom a lot and I won't take them."     Patient Measurements: Height: 5\' 8"  (172.7 cm) Weight: 184 lb 1.6 oz (83.507 kg) IBW/kg (Calculated) : 68.4   Vital Signs: BP: 135/71 mmHg (09/15 1601) Pulse Rate: 104  (09/15 1601) Intake/Output from previous day:   Intake/Output from this shift:    Labs:  Basename 10/11/11 1347 10/09/11 0415  WBC 9.2 7.6  HGB 12.7* 12.6*  PLT 139* 119*  LABCREA -- --  CREATININE 2.88* 0.53   Estimated Creatinine Clearance: 26.9 ml/min (by C-G formula based on Cr of 2.88).  Basename 10/08/11 2223  VANCOTROUGH 28.7*  VANCOPEAK --  VANCORANDOM --  GENTTROUGH --  GENTPEAK --  GENTRANDOM --  TOBRATROUGH --  TOBRAPEAK --  TOBRARND --  AMIKACINPEAK --  AMIKACINTROU --  AMIKACIN --     Microbiology: No results found for this or any previous visit (from the past 720 hour(s)).  Medical History: Past Medical History  Diagnosis Date  . Hypertension   . Diabetes mellitus   . Back pain   . Cancer   . GERD (gastroesophageal reflux disease)     Medications:  See PTA medication list  Assessment: 66 yo male with metastatic cancer and recent discharge from [facility] after treatment of cellulitis. He presents to the ED today after fall and syncopal event. Patient noted to have hypoglycemia, rhabdo, and ARF. Pharmacy consulted to begin vancomycin and Zosyn empirically. Spoke with Dr. Ardyth Harps about the presumed PCN allergy but the reaction is an intolerance not an allergy.  Patient's last dose of vancomycin was 9/12 at [facility] and vancomycin trough was 28.7. Will check a STAT level to ensure patient has cleared previous vancomycin with ARF.  Goal of Therapy:  Vancomycin trough  level 15-20 mcg/ml  Plan:  -Vancomycin level STAT -Zosyn 3.375 g IV q8h (infused over 4 hours) -Follow-up renal function and [employer], [address].D., BCPS Clinical Pharmacist Pager: [phone] 10/11/2011 5:13 PM   Addendum: Sherron Monday with phlebotomy and STAT level still not drawn yet. Will not delay therapy any longer. Likely vancomycin previously given was cleared prior to ARF and this event.  Vancomycin 1000 mg IV q24h Check steady state level   [facility], [address].D., BCPS Clinical Pharmacist Pager: [phone] 10/11/2011 6:38 PM

## 2011-10-12 DIAGNOSIS — R627 Adult failure to thrive: Secondary | ICD-10-CM | POA: Diagnosis present

## 2011-10-12 DIAGNOSIS — I951 Orthostatic hypotension: Secondary | ICD-10-CM | POA: Diagnosis present

## 2011-10-12 DIAGNOSIS — R7401 Elevation of levels of liver transaminase levels: Secondary | ICD-10-CM | POA: Diagnosis present

## 2011-10-12 DIAGNOSIS — E860 Dehydration: Secondary | ICD-10-CM | POA: Diagnosis present

## 2011-10-12 DIAGNOSIS — D649 Anemia, unspecified: Secondary | ICD-10-CM | POA: Diagnosis present

## 2011-10-12 LAB — BASIC METABOLIC PANEL
BUN: 58 mg/dL — ABNORMAL HIGH (ref 6–23)
Chloride: 103 mEq/L (ref 96–112)
GFR calc Af Amer: 54 mL/min — ABNORMAL LOW (ref >90)
GFR calc non Af Amer: 46 mL/min — ABNORMAL LOW (ref >90)
Glucose, Bld: 203 mg/dL — ABNORMAL HIGH (ref 70–99)
Potassium: 4.2 mEq/L (ref 3.5–5.1)
Sodium: 137 mEq/L (ref 135–145)

## 2011-10-12 LAB — GLUCOSE, CAPILLARY
Glucose-Capillary: 35 mg/dL — CL (ref 70–99)
Glucose-Capillary: 123 mg/dL — ABNORMAL HIGH (ref 70–99)
Glucose-Capillary: 254 mg/dL — ABNORMAL HIGH (ref 70–99)
Glucose-Capillary: 223 mg/dL — ABNORMAL HIGH (ref 70–99)
Glucose-Capillary: 186 mg/dL — ABNORMAL HIGH (ref 70–99)
Glucose-Capillary: 345 mg/dL — ABNORMAL HIGH (ref 70–99)

## 2011-10-12 LAB — TROPONIN I
Troponin I: <0.3 ng/mL (ref <0.30)
Troponin I: <0.3 ng/mL (ref <0.30)

## 2011-10-12 LAB — CBC
HCT: 32.6 % — ABNORMAL LOW (ref 39.0–52.0)
Hemoglobin: 10.9 g/dL — ABNORMAL LOW (ref 13.0–17.0)
RBC: 3.83 MIL/uL — ABNORMAL LOW (ref 4.22–5.81)

## 2011-10-12 LAB — CK: Total CK: 4737 U/L — ABNORMAL HIGH (ref 7–232)

## 2011-10-12 LAB — HEMOGLOBIN A1C: Hgb A1c MFr Bld: 10.1 % — ABNORMAL HIGH (ref <5.7)

## 2011-10-12 MED ORDER — ENOXAPARIN SODIUM 40 MG/0.4ML ~~LOC~~ SOLN: 40.0000 mg | SUBCUTANEOUS | Status: DC

## 2011-10-12 MED ORDER — INSULIN ASPART 100 UNIT/ML ~~LOC~~ SOLN
0.0000 [IU] | SUBCUTANEOUS | Status: DC
  Administered 2011-10-12: 13:00:00 via SUBCUTANEOUS

## 2011-10-12 MED ORDER — INSULIN ASPART 100 UNIT/ML ~~LOC~~ SOLN
22.0000 [IU] | Freq: Once | SUBCUTANEOUS | Status: AC
  Administered 2011-10-12: 22 [IU] via SUBCUTANEOUS

## 2011-10-12 MED ORDER — INSULIN ASPART 100 UNIT/ML ~~LOC~~ SOLN
6.0000 [IU] | Freq: Three times a day (TID) | SUBCUTANEOUS | Status: DC
  Administered 2011-10-13 – 2011-10-15 (×7): 6 [IU] via SUBCUTANEOUS

## 2011-10-12 MED ORDER — INSULIN GLARGINE 100 UNIT/ML ~~LOC~~ SOLN
20.0000 [IU] | Freq: Every day | SUBCUTANEOUS | Status: DC
Start: 2011-10-12 — End: 2011-10-13
  Administered 2011-10-12: 20 [IU] via SUBCUTANEOUS

## 2011-10-12 MED ORDER — PRO-STAT SUGAR FREE PO LIQD
30.0000 mL | Freq: Every day | ORAL | Status: DC
  Administered 2011-10-13 – 2011-10-14 (×2): 30 mL via ORAL
  Filled 2011-10-12 (×3): qty 30

## 2011-10-12 MED ORDER — INSULIN ASPART 100 UNIT/ML ~~LOC~~ SOLN
0.0000 [IU] | Freq: Every day | SUBCUTANEOUS | Status: DC
  Administered 2011-10-12 – 2011-10-13 (×2): 4 [IU] via SUBCUTANEOUS
  Administered 2011-10-14: 5 [IU] via SUBCUTANEOUS

## 2011-10-12 MED ORDER — OXYCODONE HCL 5 MG PO TABS
5.0000 mg | ORAL_TABLET | ORAL | Status: DC | PRN
  Administered 2011-10-14: 5 mg via ORAL
  Administered 2011-10-15 (×2): 10 mg via ORAL
  Filled 2011-10-12 (×2): qty 2
  Filled 2011-10-12: qty 1

## 2011-10-12 MED ORDER — SODIUM CHLORIDE 0.9 % IV SOLN
INTRAVENOUS | Status: DC
  Administered 2011-10-12 – 2011-10-15 (×2): via INTRAVENOUS

## 2011-10-12 MED ORDER — SILVER SULFADIAZINE 1 % EX CREA
1.0000 "application " | TOPICAL_CREAM | Freq: Every day | CUTANEOUS | Status: DC
  Administered 2011-10-12 – 2011-10-15 (×3): 1 via TOPICAL
  Filled 2011-10-12 (×2): qty 85

## 2011-10-12 MED ORDER — INSULIN ASPART 100 UNIT/ML ~~LOC~~ SOLN
0.0000 [IU] | Freq: Three times a day (TID) | SUBCUTANEOUS | Status: DC
  Administered 2011-10-13: 15 [IU] via SUBCUTANEOUS
  Administered 2011-10-13: 7 [IU] via SUBCUTANEOUS
  Administered 2011-10-13: 11 [IU] via SUBCUTANEOUS
  Administered 2011-10-14: 7 [IU] via SUBCUTANEOUS
  Administered 2011-10-14 (×2): 4 [IU] via SUBCUTANEOUS
  Administered 2011-10-15: 15 [IU] via SUBCUTANEOUS
  Administered 2011-10-15: 11 [IU] via SUBCUTANEOUS
  Administered 2011-10-15: 7 [IU] via SUBCUTANEOUS

## 2011-10-12 MED ORDER — ACETAMINOPHEN 325 MG PO TABS
650.0000 mg | ORAL_TABLET | Freq: Four times a day (QID) | ORAL | Status: DC | PRN
  Administered 2011-10-13: 650 mg via ORAL
  Filled 2011-10-12: qty 2

## 2011-10-12 MED ORDER — ENOXAPARIN SODIUM 40 MG/0.4ML ~~LOC~~ SOLN
40.0000 mg | SUBCUTANEOUS | Status: DC
  Administered 2011-10-12 – 2011-10-14 (×3): 40 mg via SUBCUTANEOUS
  Filled 2011-10-12 (×4): qty 0.4

## 2011-10-12 NOTE — Progress Notes (Addendum)
[facility] Progress Note [place] TEAM 1 - Stepdown/ICU TEAM   BANE HAGY WGN:[id number] DOB: Mar 23, 1945 DOA: 10/11/2011 PCP: Florentina Jenny, MD  Brief narrative: 66 year old male patient who lives alone. 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 In addition he has diabetes and ongoing alcohol abuse. He had recently been hospitalized at [facility] long with lower extremity cellulitis. He was sent back to the ER today after experiencing a syncopal event at home and suffering a fall. EMS evaluation revealed CBG in the 20s and patient remained persistently hypoglycemic even after arrival to the emergency department. His initial blood pressure was low in the 70s but responded to fluid resuscitation. Initial lactic acid was elevated at 2.8 and he was also experiencing acute renal failure with a creatinine of 2.88. This is compared to baseline 3 days ago of 0.5. He also has evidence of rhabdomyolysis with a CPK greater than 8000. He was admitted to the step down unit for further evaluation and treatment.  Assessment/Plan:  ARF (acute renal failure)/Rhabdomyolysis *Appears to be mediated primarily by volume depletion and poor oral intake prior to admission *There may be a degree of ATN given the fact patient on an ACE inhibitor prior to admission *Creatinine trending downward with administration of IV fluids with marked improvement in GFR *Continue to hold ACE inhibitor for now *CPK also trending downward now 4700  DM (diabetes mellitus)-controlled/ Hypoglycemia *Suspect related to poor oral dietary intake prior to admission *Also was on long-acting insulin contributed to hypoglycemia especially with poor intake *Check Hemoglobin A1c clarify glycemic control *Continue to monitor CBGs which have been more stable so we'll decrease to Q4 hours, sliding scale insulin is available if hyperglycemia should recur  ETOH abuse *Alcohol level less than 11 at presentation *Currently  no signs of alcohol withdrawal but as a precaution we'll institute CIWA protocol  Dehydration/ Orthostasis/ FTT (failure to thrive) in adult *Feel this is the primary etiology to patient's initial presentation with syncope and hypotension *Blood pressure has rebounded nicely with rehydration and holding antihypertensive agents *PT and OT evaluation *Discuss with patient and he agrees he is unable to care for himself properly at home and is agreeable to evaluation for placement after discharge  Mild hypoxemic respiratory failure/tobacco abuse *Chest x-ray only shows parenchymal scarring in no acute process such as CHF or pneumonia *Continue supportive care with Combivent meter dose inhaler *Likely has underlying COPD  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 In addition there was noted to be widespread metastatic disease to the cervical thoracic spine but no pathological fracture cord deformity or foraminal compromise in the thoracic spine. Cervical assessment was limited because the patient was unable to complete the examination. *Continue Decadron *CT head this admission notes interval development of numerous calvarial lucencies concerning for metastatic disease *Continue OxyContin for pain management  Wounds of LE  *Appears more consistent with vascular insuff *will discontinue broad-spectrum antibiotic and follow *Wound ostomy care RN evaluation for appropriate wound care - will f/u on their recs - may require surgical debridement   Sepsis *At this point patient does not meet criteria for sepsis and it is suspicioned that his symptoms are  primarily related to dehydration and orthostasis *  Procalcitonin is normal *Lactic acid was elevated because of low perfusion and dehydration  Encephalopathy acute *Directly related to hypotension but more so related to hypoglycemia *Resolved   Hypertension *Blood pressure has rebounded with adequate hydration *Consider resuming antihypertensives in the next 24 hours  Facial trauma *Onset after a fall and no evidence of bony injury  Transaminitis *Suspect elevated alkaline phosphatase directly related to bone cancer *Mild elevation and other LFTs related to low perfusion from dehydration rhabdomyolysis *Follow labs  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 Case management and social work consultation for likely skilled nursing facility placement at time of discharge  Consultants: None  Procedures: None  Antibiotics: Zosyn 9/15 >>>9/16 Vancomycin 9/15 >>>9/16  HPI/Subjective: Patient alert and primarily complaining of malaise. It is noted he has eaten 100% of his breakfast tray. He confirms he lives alone and has been unable to adequately manage his care. No complaints of chest pain or shortness of breath verbalized.   Objective: Blood pressure 142/58, pulse 96, temperature 98.3 F (36.8 C), temperature source Axillary, resp. rate 12, height 5\' 8"  (1.727 m), weight 83.507 kg (184 lb 1.6 oz), SpO2 92.00%.  Intake/Output Summary (Last 24 hours) at 10/12/11 1059 Last data filed at 10/12/11 1000  Gross per 24 hour  Intake   3666 ml  Output    475 ml  Net   3191 ml     Exam: General: No acute respiratory distress Lungs:  Clear to auscultation bilaterally without wheezes or crackles, decreased in bases, 1 L nasal cannula oxygen Cardiovascular: Regular rate and rhythm without murmur gallop or rub normal S1 and S2, sinus tachycardia, chronic spongy lower extremity edema Abdomen: Nontender, nondistended, soft, bowel sounds positive, no rebound, no ascites, no appreciable mass Musculoskeletal: No significant cyanosis, clubbing of extremities Skin: Patient has dressings over the distal lower extremities and has evidence of prior amputation right foot; has mild erythematous changes to lower extremities and multiple scattered healing lesions with eschars Neurological: Patient is alert and appears to be oriented x3, moves all extremities x4  Data Reviewed: Basic Metabolic Panel:  Lab 10/12/11 1610 10/11/11 1922 10/11/11 1347 10/09/11 0415 10/08/11 0342 10/07/11 0330  NA 137 -- 137 134* 132* 131*  K 4.2 -- 4.4 3.8 3.8 3.7  CL 103 -- 98 97 95* 94*  CO2 24 -- 25 29 31  32  GLUCOSE 203* -- 233* 162* 385* 145*  BUN 58* -- 70* 15 16 18   CREATININE 1.52* 2.31* 2.88* 0.53 0.58 --  CALCIUM 8.2* -- 7.2* 8.8 8.7 8.5  MG -- -- -- -- -- --  PHOS -- -- -- -- -- --   Liver Function Tests:  Lab 10/11/11 1347 10/08/11 0342 10/06/11 0337 10/05/11 2050  AST 182* 17 11 15   ALT 87* 25 23 28   ALKPHOS 476* 484* 527* 642*  BILITOT 0.2* 0.2* 0.2* 0.4  PROT 5.2* 5.2* 4.9* 6.3  ALBUMIN 1.7* 1.9* 2.0* 2.6*   CBC:  Lab 10/12/11 0625 10/11/11 1922 10/11/11 1347 10/09/11 0415 10/08/11 0342 10/06/11 0337 10/05/11 2050  WBC 7.9 8.9 9.2 7.6 7.4 -- --  NEUTROABS -- -- 8.1* -- -- 5.2 6.2  HGB 10.9* 12.5* 12.7* 12.6* 11.6* -- --  HCT  32.6* 37.0* 37.5* 37.6* 35.1* -- --  MCV 85.1 84.3 85.8 84.7 85.0 -- --  PLT 121* 136* 139* 119* 101* -- --   Cardiac Enzymes:  Lab 10/12/11 0625 10/11/11 2328 10/11/11 1917 10/11/11 1347  CKTOTAL 4737* -- -- 8506*  CKMB -- -- -- --  CKMBINDEX -- -- -- --  TROPONINI <0.30 <0.30 <0.30 --   CBG:  Lab  10/12/11 0850 10/11/11 1842 10/11/11 1821 10/11/11 1703 10/11/11 1559  GLUCAP 186* 123* 35* 66* 47*    Recent Results (from the past 240 hour(s))  CULTURE, BLOOD (ROUTINE X 2)     Status: Normal (Preliminary result)   Collection Time   10/11/11  2:00 PM      Component Value Range Status Comment   Specimen Description BLOOD LEFT HAND   Final    Special Requests BOTTLES DRAWN AEROBIC ONLY 5CC   Final    Culture  Setup Time 10/11/2011 23:06   Final    Culture     Final    Value:        BLOOD CULTURE RECEIVED NO GROWTH TO DATE CULTURE WILL BE HELD FOR 5 DAYS BEFORE ISSUING A FINAL NEGATIVE REPORT   Report Status PENDING   Incomplete   CULTURE, BLOOD (ROUTINE X 2)     Status: Normal (Preliminary result)   Collection Time   10/11/11  2:07 PM      Component Value Range Status Comment   Specimen Description BLOOD RIGHT HAND   Final    Special Requests BOTTLES DRAWN AEROBIC ONLY 5CC   Final    Culture  Setup Time 10/11/2011 23:06   Final    Culture     Final    Value:        BLOOD CULTURE RECEIVED NO GROWTH TO DATE CULTURE WILL BE HELD FOR 5 DAYS BEFORE ISSUING A FINAL NEGATIVE REPORT   Report Status PENDING   Incomplete   MRSA PCR SCREENING     Status: Normal   Collection Time   10/11/11  6:35 PM      Component Value Range Status Comment   MRSA by PCR NEGATIVE  NEGATIVE Final      Studies:  Recent x-ray studies have been reviewed in detail by the Attending Physician  Scheduled Meds:  Reviewed in detail by the Attending Physician   Junious Silk, ANP [facility] Office  [phone] Pager [phone]  On-Call/Text Page:      [email or web address]      password [email or web address]  If 7PM-7AM, please contact night-coverage [email or web address] Password [email or web address] 10/12/2011, 10:59 AM   LOS: 1 day   I have personally examined this patient and reviewed the entire database. I have reviewed the above note, made any necessary editorial changes, and agree with its content.  Lonia Blood, MD [facility]

## 2011-10-12 NOTE — Progress Notes (Signed)
INITIAL ADULT NUTRITION ASSESSMENT Date: 10/12/2011   Time: 2:45 PM   INTERVENTION:  Prostat liquid protein 30 ml PO daily (100 kcals, 15 gm protein per dose) RD to follow for nutrition care plan  Reason for Assessment: Malnutrition Screening Tool Report  ASSESSMENT: Male 66 y.o.  Dx: ARF (acute renal failure)  Hx:  Past Medical History  Diagnosis Date  . Hypertension   . Diabetes mellitus   . Back pain   . Cancer   . GERD (gastroesophageal reflux disease)     Past Surgical History  Procedure Date  . Total hip arthroplasty   . Toe resections     Related Meds:     . atorvastatin  20 mg Oral q1800  . dexamethasone  4 mg Oral BID WC  . dextrose      . docusate sodium  100 mg Oral Daily  . enoxaparin (LOVENOX) injection  40 mg Subcutaneous Q24H  . folic acid  1 mg Oral Daily  . insulin aspart  0-15 Units Subcutaneous Q4H  . multivitamin with minerals  1 tablet Oral Daily  . oxyCODONE  20 mg Oral Q12H  . pantoprazole  40 mg Oral Q1200  . piperacillin-tazobactam (ZOSYN)  IV  3.375 g Intravenous Q8H  . silver sulfADIAZINE  1 application Topical Daily  . sodium chloride  1,000 mL Intravenous Once  . sodium chloride  1,000 mL Intravenous Once  . thiamine  100 mg Oral Daily  . vancomycin  1,000 mg Intravenous Once  . vancomycin  1,000 mg Intravenous Q24H  . vitamin B-12  1,000 mcg Oral Daily  . DISCONTD: sodium chloride   Intravenous STAT  . DISCONTD: dextrose  25 g Intravenous Once  . DISCONTD: enoxaparin (LOVENOX) injection  30 mg Subcutaneous Q24H  . DISCONTD: enoxaparin (LOVENOX) injection  40 mg Subcutaneous Q24H    Ht: 5\' 8"  (172.7 cm)  Wt: 184 lb 1.6 oz (83.507 kg)  Ideal Wt: 70 kg % Ideal Wt: 118%  Usual Wt: 84 kg -- August 2013 % Usual Wt: 98%  Wt Readings from Last 3 Encounters:  10/11/11 184 lb 1.6 oz (83.507 kg)  10/06/11 184 lb 1.6 oz (83.507 kg)  09/11/11 185 lb 6.4 oz (84.097 kg)    Body mass index is 27.99 kg/(m^2).  Food/Nutrition  Related Hx: decreased appetite per admission nutrition screen  Labs:  CMP     Component Value Date/Time   NA 137 10/12/2011 0625   K 4.2 10/12/2011 0625   CL 103 10/12/2011 0625   CO2 24 10/12/2011 0625   GLUCOSE 203* 10/12/2011 0625   BUN 58* 10/12/2011 0625   CREATININE 1.52* 10/12/2011 0625   CALCIUM 8.2* 10/12/2011 0625   PROT 5.2* 10/11/2011 1347   ALBUMIN 1.7* 10/11/2011 1347   AST 182* 10/11/2011 1347   ALT 87* 10/11/2011 1347   ALKPHOS 476* 10/11/2011 1347   BILITOT 0.2* 10/11/2011 1347   GFRNONAA 46* 10/12/2011 0625   GFRAA 54* 10/12/2011 0625    Phosphorus  Date Value Range Status  09/10/2011 3.5  2.3 - 4.6 mg/dL Final     Intake/Output Summary (Last 24 hours) at 10/12/11 1446 Last data filed at 10/12/11 1400  Gross per 24 hour  Intake 4064.33 ml  Output    775 ml  Net 3289.33 ml    CBG (last 3)   Basename 10/12/11 1203 10/12/11 0850 10/12/11 0654  GLUCAP 345* 186* 208*    Diet Order: General  Supplements/Tube Feeding: N/A  IVF:  dextrose 5 % and 0.9% NaCl Last Rate: 75 mL/hr at 10/12/11 1158    Estimated Nutritional Needs:   Kcal: 2100-2300 Protein: 110-120 gm Fluid: 2.1-2.3 L  Patient with known metastatic cancer to the bone; recently been hospitalized at [facility] with lower extremity cellulitis; sent back to ER after experiencing a syncopal event at home and suffering a fall; EMS evaluation revealed CBG in the 20s and patient remained persistently hypoglycemic; admitted to the step down unit for further evaluation and treatment.  Patient reports a good appetite; PO intake 100% per flowsheet records; weight has been stable per records since August 2013; CWOCN note reviewed 9/16 -- left full thickness wound, R stump full thickness wound, left flank partial thickness abrasion and left ischium unstageable wound -- would benefit from additional protein -- RD to order.  NUTRITION DIAGNOSIS: -Increased nutrient needs (NI-5.1).  Status: Ongoing  RELATED TO: wound  healing  AS EVIDENCE BY: estimated nutrition needs  MONITORING/EVALUATION(Goals): Goal: Oral intake with meals & supplements to meet >/= 90% of estimated nutrition needs Monitor: PO & supplemental intake, weight, labs, I/O's  EDUCATION NEEDS: -No education needs identified at this time  Dietitian #: [phone]  DOCUMENTATION CODES Per approved criteria  -Not Applicable    Bradley [name] 10/12/2011, 2:45 PM

## 2011-10-12 NOTE — Clinical Social Work Psychosocial (Signed)
     Clinical Social Work Department BRIEF PSYCHOSOCIAL ASSESSMENT 10/12/2011  Patient:  Bradley [name], Bradley [name]     Account Number:  [id number]     Admit date:  10/11/2011  Clinical Social Worker:  Margaree Mackintosh  Date/Time:  10/12/2011 03:12 PM  Referred by:  Physician  Date Referred:  10/12/2011 Referred for  SNF Placement   Other Referral:   Interview type:  Other - See comment Other interview type:   Sister: [facility] Worker: Diane.    PSYCHOSOCIAL DATA Living Status:  ALONE Admitted from facility:   Level of care:   Primary support name:  [facility] Primary support relationship to patient:   Degree of support available:   Adequate.    CURRENT CONCERNS Current Concerns  Post-Acute Placement   Other Concerns:    SOCIAL WORK ASSESSMENT / PLAN Clinical Social Worker recieved referral indicating pt may benefit from SNF at dc.  CSW reviewed chart. Pt currently unable to parcipate in assessment due to medical condition. Per chart review, pt was dc'd from [facility] on 10/09/11. Pt was dc'd with [facility].  Per [facility], pt is non compliant with medical recommendations-does not want assistance with medications, showering, or wound care.  Hospice described living environment as "primitive".  Hospice cautioned CSW from discussing SNF option with pt "one on one".    CSW phoned pt's sister-Patricia who reported, "yea, he'll need some sort of rehab".  Sister was familiar with pt's CA diagnosis but, per sister's report, pt informed sister "they just need to get rid of it".  Sister reports pt previously was at a SNF for rehab but can not remeber what the name of the facility is.      CSW to continue to follow and assist as needed.   Assessment/plan status:  Information/Referral to [facility] Other assessment/ plan:   Information/referral to community resources:   Hospice    PATIENTS/FAMILYS RESPONSE TO PLAN OF  CARE: Pt currently not able to participate in assessment due to medical condition.  Sister thanked CSW for intervention.

## 2011-10-12 NOTE — Progress Notes (Signed)
Clinical Social Worker me twith pt and sister-Patricia at bedside.  Pt asked about "going to a home to get stronger".  CSW reviewed process of locating rehab facilities.  Pt agreeable.  Pt stated his sister could assist with process of locating SNF.  CSW to continue to follow and assist as needed.   Angelia Mould, MSW, [place] [phone]

## 2011-10-12 NOTE — Progress Notes (Signed)
Pt to be transferred to 5 North bed 7.  Report called to [facility]. Pt with elevated CBG of 507.  Result sent to Dr Sharon Seller. Pt treated with Novolog 22 units.  Spoke with Dr Sharon Seller, and per Md pt able to continue transfer to 5 [place].

## 2011-10-12 NOTE — Care Management Note (Signed)
  Page 1 of 1   10/12/2011     5:10:19 PM   CARE MANAGEMENT NOTE 10/12/2011  Patient:  Bradley [name], Bradley [name]   Account Number:  [id number]  Date Initiated:  10/12/2011  Documentation initiated by:  Alvira Philips Assessment:   66 yr-old male adm with dx of sepsis; lives alone, active with [facility]     In-house referral  Clinical Social Worker      DC Associate Professor  CM consult      Per UR Regulation:  Reviewed for med. necessity/level of care/duration of stay  Comments:  PCP:  Dr. Floreen Comber:  Elease Hashimoto, sister [phone] (w), [phone] (c)  10/12/11 1650 Bradley Feltus RN MSN CCM Met with pt and sister @ bedside.  Pt states he will need rehab before he returns home, sister indicates she will assist with choosing SNF.  CSW following.

## 2011-10-12 NOTE — Consult Note (Signed)
WOC consult Note Pt has 2 additional wounds that were not noted in previous consult.  Left flank partial thickness abrasion, 2X3X.1cm, pale white-yellow wound bed. Small yellow drainage, no odor.  Left ischium unstageable wound, present on admission 4X4cm 100% eschar.  No odor, small tan drainage.    Plan:  Foam dressing to flank to protect and promote healing.  Silvadene to left ischium to assist with removal of non-viable tissue.  Air mattress to decrease pressure.  Optimize nutrition.   Cammie Mcgee, RN, MSN, [employer]  [phone]

## 2011-10-12 NOTE — Consult Note (Signed)
WOC consult Note Reason for Consult: Consult requested for BLE.  Pt has fallen multiple times at home and has numerous partial thickness abrasions to left and right legs.  Most with erythremia, dry red scabs, no odor or drainage.  Wound type: Left foot with full thickness wound, 9X9cm to anterior foot./  Fluctuant, small hole draining large amt pink-tan drainage, foul odor, wound 95% yellow slough, 5% black eschar. Right stump with full thickness wound 4.5X2cm 95% eschar, 5% red.  Mod tan drainage, some odor, erythremia surrounding wound.  Pressure Ulcer POA: These are not consistent with pressure ulcers, appearance consistent with vascular insufficiency and infection.. Dressing procedure/placement/frequency: IF AGGRESSIVE PLAN OF CARE DESIRED, RECOMMEND ORTHO CONSULT R/T EXTENSIVE AMT NON-VIABLE TISSUE ON LEFT FOOT AND POCKET OF FLUID UNDER WOUND BED.  Silvadene to left and right foot to assist with removal of non-viable tissue until further plan of care is decided.  Foam dressing to multiple abrasions on BLE, which are too numerous to describe and measure.   Cammie Mcgee, RN, MSN, [employer]  [phone]

## 2011-10-12 NOTE — Clinical Social Work Placement (Addendum)
    Clinical Social Work Department CLINICAL SOCIAL WORK PLACEMENT NOTE 10/12/2011  Patient:  Bradley [name], Bradley [name]  Account Number:  [id number] Admit date:  10/11/2011  Clinical Social Worker:  Margaree Mackintosh  Date/time:  10/12/2011 04:15 PM  Clinical Social Work is seeking post-discharge placement for this patient at the following level of care:   SKILLED NURSING   (*CSW will update this form in Epic as items are completed)     Patient/family provided with [facility] Department of Clinical Social Work's list of facilities offering this level of care within the geographic area requested by the patient (or if unable, by the patient's family).  10/12/2011  Patient/family informed of their freedom to choose among providers that offer the needed level of care, that participate in Medicare, Medicaid or managed care program needed by the patient, have an available bed and are willing to accept the patient.    Patient/family informed of [facility]' ownership interest in [facility], as well as of the fact that they are under no obligation to receive care at this facility.  PASARR submitted to EDS on 10/12/2011 PASARR number received from EDS on  10/13/2011  FL2 transmitted to all facilities in geographic area requested by pt/family on  10/12/2011 FL2 transmitted to all facilities within larger geographic area on   Patient informed that his/her managed care company has contracts with or will negotiate with  certain facilities, including the following:     Patient/family informed of bed offers received:  10/15/2011 Patient chooses bed at [facility] Physician recommends and patient chooses bed at    Patient to be transferred to [facility].on   10/15/2011 Patient to be transferred to facility by ambulance  [facility])  The following physician request were entered in Epic:   Additional Comments: Patient is pleased with bed offers and  chose [facility]. [name] is in agreement; she will go to facility to sign admission papers.  Notified SNF and pt's nurse- [place] of d/c.  Lorri Frederick. [place]  [phone]

## 2011-10-13 DIAGNOSIS — C801 Malignant (primary) neoplasm, unspecified: Secondary | ICD-10-CM

## 2011-10-13 DIAGNOSIS — C7951 Secondary malignant neoplasm of bone: Secondary | ICD-10-CM

## 2011-10-13 DIAGNOSIS — M6282 Rhabdomyolysis: Secondary | ICD-10-CM

## 2011-10-13 DIAGNOSIS — E119 Type 2 diabetes mellitus without complications: Secondary | ICD-10-CM

## 2011-10-13 DIAGNOSIS — N179 Acute kidney failure, unspecified: Principal | ICD-10-CM

## 2011-10-13 LAB — CBC
HCT: 32.5 % — ABNORMAL LOW (ref 39.0–52.0)
MCV: 86.2 fL (ref 78.0–100.0)
RDW: 15.3 % (ref 11.5–15.5)
WBC: 7.8 10*3/uL (ref 4.0–10.5)

## 2011-10-13 LAB — BASIC METABOLIC PANEL
BUN: 33 mg/dL — ABNORMAL HIGH (ref 6–23)
CO2: 30 mEq/L (ref 19–32)
Chloride: 104 mEq/L (ref 96–112)
Creatinine, Ser: 0.8 mg/dL (ref 0.50–1.35)

## 2011-10-13 LAB — GLUCOSE, CAPILLARY

## 2011-10-13 LAB — CK: Total CK: 1550 U/L — ABNORMAL HIGH (ref 7–232)

## 2011-10-13 MED ORDER — INSULIN GLARGINE 100 UNIT/ML ~~LOC~~ SOLN
24.0000 [IU] | Freq: Every day | SUBCUTANEOUS | Status: DC
  Administered 2011-10-13: 24 [IU] via SUBCUTANEOUS

## 2011-10-13 MED ORDER — ALBUTEROL SULFATE (5 MG/ML) 0.5% IN NEBU
2.5000 mg | INHALATION_SOLUTION | Freq: Once | RESPIRATORY_TRACT | Status: AC
  Administered 2011-10-13: 2.5 mg via RESPIRATORY_TRACT
  Filled 2011-10-13: qty 0.5

## 2011-10-13 MED ORDER — ALBUTEROL SULFATE (5 MG/ML) 0.5% IN NEBU: 2.5000 mg | INHALATION_SOLUTION | Freq: Four times a day (QID) | RESPIRATORY_TRACT | Status: DC | PRN

## 2011-10-13 NOTE — Progress Notes (Signed)
[facility] Progress Note    Bradley [name] ZOX:[id number] DOB: [birth year] DOA: 10/11/2011 PCP: Florentina Jenny, MD  Brief narrative: 66 year old male patient who lives alone. 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 In addition he has diabetes and ongoing alcohol abuse. He had recently been hospitalized at [facility] with lower extremity cellulitis. He was sent back to the ER after experiencing a syncopal event at home and suffering a fall. EMS evaluation revealed CBG in the 20s and patient remained persistently hypoglycemic even after arrival to the emergency department. His initial blood pressure was low in the 70s but responded to fluid resuscitation. Initial lactic acid was elevated at 2.8 and he was also experiencing acute renal failure with a creatinine of 2.88. This was compared to baseline 3 days prior to admission of 0.5. He also has evidence of rhabdomyolysis with a CPK greater than 8000. He was admitted to the step down unit for further evaluation and treatment. He was then transferred to the floor.  Assessment/Plan:  ARF (acute renal failure)/Rhabdomyolysis *Appears to be mediated primarily by volume depletion and poor oral intake prior to admission *There may be a degree of ATN given the fact patient on an ACE inhibitor prior to admission *Creatinine trending downward with administration of IV fluids with marked improvement in GFR *Continue to hold ACE inhibitor for now *CPK also trending downward now 1550 * Will decrease IVF rate.  DM (diabetes mellitus)-controlled/ Hypoglycemia *Suspect related to poor oral dietary intake prior to admission *Also was on long-acting insulin contributed to hypoglycemia especially with poor intake *Hemoglobin A1c is 10.2 implying poor glycemic control. *Continue to monitor CBGs which have been more stable. Sliding scale insulin . Lantus was resumed as well. Adjust as needed.  ETOH abuse *Alcohol level less than 11 at  presentation *Currently no signs of alcohol withdrawal.    Dehydration/ Orthostasis/ FTT (failure to thrive) in adult *Feel this is the primary etiology to patient's initial presentation with syncope and hypotension *Blood pressure has rebounded nicely with rehydration and holding antihypertensive agents *PT and OT evaluation *Patient agreeable to SNF. Bed search is ongoing.  Mild hypoxemic respiratory failure/tobacco abuse *Chest x-ray only shows parenchymal scarring in no acute process such as CHF or pneumonia *Continue supportive care with Combivent meter dose inhaler *Likely has underlying COPD. Since he is wheezing will give neb treatment.  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 In addition there was noted to be widespread metastatic disease to the cervical thoracic spine but no pathological fracture cord deformity or foraminal compromise in the thoracic spine. Cervical assessment was limited because the patient was unable to complete the examination. *Continue Decadron *CT head this admission notes interval development of numerous calvarial lucencies concerning for metastatic disease *Continue OxyContin for pain management *He will need to follow up with his Oncologist (Dr. Cyndie Chime) for further management. He was under hospice care at home but has been discharged due to non-compliance.  Wounds of LE  *Appears more consistent with vascular insuff *Broad-spectrum antibiotics were discontinued 9/16. Afebrile so far. WBC normal. *Wound ostomy care RN evaluation for appropriate wound care   Sepsis *At this point patient does not meet  criteria for sepsis and it  is suspicioned that his symptoms are primarily related to dehydration and orthostasis *Procalcitonin is normal *Lactic acid was elevated because of low perfusion and dehydration  Encephalopathy acute *Directly related to hypotension but more so related to hypoglycemia *Resolved   Hypertension *Blood pressure has rebounded with adequate hydration *Consider resuming antihypertensives if BP remains elevated.  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 Anticipate discharge in 1-2 days. Involve PT/OT Consultants: None  Procedures: None  Antibiotics: Zosyn 9/15 >>>9/16 Vancomycin 9/15 >>>9/16  HPI/Subjective: Patient denies any complaints. No shortness of breath. Tolerating diet. Unable to ambulate due to weakness.    Objective: Blood pressure 155/72, pulse 91, temperature 98 F (36.7 C), temperature source Oral, resp. rate 18, height 5\' 8"  (1.727 m), weight 83.507 kg (184 lb 1.6 oz), SpO2 98.00%.  Intake/Output Summary (Last 24 hours) at 10/13/11 0828 Last data filed at 10/13/11 0524  Gross per 24 hour  Intake 1867.08 ml  Output   2250 ml  Net -382.92 ml     Exam: General: No acute respiratory distress Lungs: Few end exp wheezing hear bilaterally. No crackles.  Cardiovascular: Regular rate and rhythm without murmur gallop or rub normal S1 and S2, sinus tachycardia,  chronic spongy lower extremity edema Abdomen: Nontender, nondistended, soft, bowel sounds positive, no rebound, no ascites, no appreciable mass Musculoskeletal: No significant cyanosis, clubbing of extremities Skin: Patient has dressings over the distal lower extremities and has evidence of prior amputation right foot; has mild erythematous changes to lower extremities and multiple scattered healing lesions with eschars Neurological: Patient is alert and appears to be oriented x3, Right LE more weak compared to left.  Data Reviewed: Basic Metabolic Panel:  Lab 10/13/11 7829 10/12/11 0625 10/11/11 1922 10/11/11 1347 10/09/11 0415 10/08/11 0342  NA 138 137 -- 137 134* 132*  K 4.3 4.2 -- 4.4 3.8 3.8  CL 104 103 -- 98 97 95*  CO2 30 24 -- 25 29 31   GLUCOSE 226* 203* -- 233* 162* 385*  BUN 33* 58* -- 70* 15 16  CREATININE 0.80 1.52* 2.31* 2.88* 0.53 --  CALCIUM 8.9 8.2* -- 7.2* 8.8 8.7  MG -- -- -- -- -- --  PHOS -- -- -- -- -- --   Liver Function Tests:  Lab 10/11/11 1347 10/08/11 0342  AST 182* 17  ALT 87* 25  ALKPHOS 476* 484*  BILITOT 0.2* 0.2*  PROT 5.2* 5.2*  ALBUMIN 1.7* 1.9*   CBC:  Lab 10/13/11 0621 10/12/11 0625 10/11/11 1922 10/11/11 1347 10/09/11 0415  WBC 7.8 7.9 8.9 9.2 7.6  NEUTROABS -- -- -- 8.1* --  HGB 10.6* 10.9* 12.5* 12.7* 12.6*  HCT 32.5* 32.6* 37.0* 37.5* 37.6*  MCV 86.2 85.1 84.3 85.8 84.7  PLT 127* 121* 136* 139* 119*   Cardiac Enzymes:  Lab 10/13/11 0621 10/12/11 0625 10/11/11 2328 10/11/11 1917 10/11/11 1347  CKTOTAL 1550* 4737* -- -- 8506*  CKMB -- -- -- -- --  CKMBINDEX -- -- -- -- --  TROPONINI -- <0.30 <0.30 <  0.30 --   CBG:  Lab 10/13/11 0714 10/12/11 2237 10/12/11 1637 10/12/11 1203 10/12/11 0850  GLUCAP 234* 307* 507* 345* 186*    Recent Results (from the past 240 hour(s))  CULTURE, BLOOD (ROUTINE X 2)     Status: Normal (Preliminary result)   Collection Time   10/11/11  2:00 PM      Component Value Range Status Comment   Specimen  Description BLOOD LEFT HAND   Final    Special Requests BOTTLES DRAWN AEROBIC ONLY 5CC   Final    Culture  Setup Time 10/11/2011 23:06   Final    Culture     Final    Value:        BLOOD CULTURE RECEIVED NO GROWTH TO DATE CULTURE WILL BE HELD FOR 5 DAYS BEFORE ISSUING A FINAL NEGATIVE REPORT   Report Status PENDING   Incomplete   CULTURE, BLOOD (ROUTINE X 2)     Status: Normal (Preliminary result)   Collection Time   10/11/11  2:07 PM      Component Value Range Status Comment   Specimen Description BLOOD RIGHT HAND   Final    Special Requests BOTTLES DRAWN AEROBIC ONLY 5CC   Final    Culture  Setup Time 10/11/2011 23:06   Final    Culture     Final    Value:        BLOOD CULTURE RECEIVED NO GROWTH TO DATE CULTURE WILL BE HELD FOR 5 DAYS BEFORE ISSUING A FINAL NEGATIVE REPORT   Report Status PENDING   Incomplete   MRSA PCR SCREENING     Status: Normal   Collection Time   10/11/11  6:35 PM      Component Value Range Status Comment   MRSA by PCR NEGATIVE  NEGATIVE Final      Bradley [name] 10/13/2011 8:38 AM  Pager: [phone]   If 7PM-7AM, please contact night-coverage [email or web address] Password [email or web address] 10/13/2011, 8:28 AM   LOS: 2 days

## 2011-10-13 NOTE — Evaluation (Signed)
Physical Therapy Evaluation Patient Details Name: Bradley [name] MRN: [id number] DOB: 1945/07/26 Today's Date: 10/13/2011 Time: 4098-1191 PT Time Calculation (min): 14 min  PT Assessment / Plan / Recommendation Clinical Impression  Pt adm with syncope, acute renal failure, rhabdomyolosis and bone mets.  Pt also with history of ETOH abuse.  Pt limited today by pain in lt leg.  Agree pt needs SNF.  Pt does not want to go to [place] where he has been before.    PT Assessment  Patient needs continued PT services    Follow Up Recommendations  Skilled nursing facility    Barriers to Discharge        Equipment Recommendations  None recommended by PT    Recommendations for Other Services     Frequency Min 2X/week    Precautions / Restrictions Precautions Precautions: Fall   Pertinent Vitals/Pain Pain in lt leg with activity 10/10.  Repositioned.      Mobility  Bed Mobility Bed Mobility: Supine to Sit;Sit to Supine Supine to Sit: 1: +1 Total assist Sit to Supine: 1: +1 Total assist Details for Bed Mobility Assistance: Pt unable to tolerate lt leg in dependent postion off EOB so pt immediately returned to supine.    Exercises     PT Diagnosis: Generalized weakness;Acute pain  PT Problem List: Decreased strength;Decreased activity tolerance;Decreased balance;Decreased mobility;Pain PT Treatment Interventions: DME instruction;Functional mobility training;Patient/family education;Therapeutic activities;Therapeutic exercise   PT Goals Acute Rehab PT Goals PT Goal Formulation: With patient Time For Goal Achievement: 10/27/11 Potential to Achieve Goals: Fair Pt will go Supine/Side to Sit: with min assist PT Goal: Supine/Side to Sit - Progress: Goal set today Pt will go Sit to Supine/Side: with min assist PT Goal: Sit to Supine/Side - Progress: Goal set today Pt will Transfer Bed to Chair/Chair to Bed: with min assist PT Transfer Goal: Bed to Chair/Chair to Bed - Progress:  Goal set today  Visit Information  Last PT Received On: 10/13/11 Assistance Needed: +2 (for OOB)    Subjective Data  Subjective: "I hope it isn't [place]," pt stated about going to a SNF. Patient Stated Goal: Pt wants to be able to get back to prior level function in his power w/c.   Prior Functioning  Home Living Lives With: Alone Available Help at Discharge: Skilled Nursing Facility Type of Home: Skilled Nursing Facility Home Adaptive Equipment: Wheelchair - powered Prior Function Able to Take Stairs?: No Driving: No Comments: Pt uses power w/c.  Doesn't appear he amb at home but difficult to assess due to pt rambling with answers to questions.    Cognition  Overall Cognitive Status: Impaired Arousal/Alertness: Awake/alert Behavior During Session: WFL for tasks performed    Extremity/Trunk Assessment Right Lower Extremity Assessment RLE ROM/Strength/Tone: Deficits RLE ROM/Strength/Tone Deficits: grossly 3-/5 Left Lower Extremity Assessment LLE ROM/Strength/Tone: Deficits;Due to pain LLE ROM/Strength/Tone Deficits: less than 3/5   Balance    End of Session PT - End of Session Activity Tolerance: Patient limited by pain Patient left: in bed;with call bell/phone within reach  GP     [facility] 10/13/2011, 3:00 PM  [facility] [phone]

## 2011-10-14 DIAGNOSIS — R748 Abnormal levels of other serum enzymes: Secondary | ICD-10-CM

## 2011-10-14 LAB — GLUCOSE, CAPILLARY
Glucose-Capillary: 331 mg/dL — ABNORMAL HIGH (ref 70–99)
Glucose-Capillary: 206 mg/dL — ABNORMAL HIGH (ref 70–99)
Glucose-Capillary: 239 mg/dL — ABNORMAL HIGH (ref 70–99)
Glucose-Capillary: 200 mg/dL — ABNORMAL HIGH (ref 70–99)

## 2011-10-14 LAB — CBC
MCV: 84.9 fL (ref 78.0–100.0)
Platelets: 151 10*3/uL (ref 150–400)
RBC: 4.17 MIL/uL — ABNORMAL LOW (ref 4.22–5.81)
WBC: 7.7 10*3/uL (ref 4.0–10.5)

## 2011-10-14 LAB — BASIC METABOLIC PANEL
CO2: 29 mEq/L (ref 19–32)
Chloride: 95 mEq/L — ABNORMAL LOW (ref 96–112)
Creatinine, Ser: 0.58 mg/dL (ref 0.50–1.35)
Potassium: 4 mEq/L (ref 3.5–5.1)
Sodium: 132 mEq/L — ABNORMAL LOW (ref 135–145)

## 2011-10-14 MED ORDER — SENNA 8.6 MG PO TABS
1.0000 | ORAL_TABLET | Freq: Every day | ORAL | Status: DC
  Filled 2011-10-14 (×3): qty 1

## 2011-10-14 MED ORDER — INSULIN ASPART 100 UNIT/ML ~~LOC~~ SOLN
5.0000 [IU] | Freq: Once | SUBCUTANEOUS | Status: AC
  Administered 2011-10-14: 5 [IU] via SUBCUTANEOUS

## 2011-10-14 MED ORDER — INSULIN ASPART 100 UNIT/ML ~~LOC~~ SOLN: 5.0000 [IU] | Freq: Once | SUBCUTANEOUS | Status: DC

## 2011-10-14 MED ORDER — INSULIN GLARGINE 100 UNIT/ML ~~LOC~~ SOLN
42.0000 [IU] | Freq: Every day | SUBCUTANEOUS | Status: DC
  Administered 2011-10-14: 42 [IU] via SUBCUTANEOUS

## 2011-10-14 NOTE — Progress Notes (Signed)
[facility] PROGRESS NOTE  Bradley [name]:[id number] DOB: March 03, 1945 DOA: 10/11/2011 PCP: Florentina Jenny, MD  Assessment/Plan: ARF (acute renal failure)/Rhabdomyolysis  *Appears to be mediated primarily by volume depletion and poor oral intake prior to admission  *There may be a degree of ATN given the fact patient on an ACE inhibitor prior to admission  *Creatinine trending downward with administration of IV fluids with marked improvement in GFR  *Continue to hold ACE inhibitor for now  *CPK also trending downward now 1550  DM (diabetes mellitus)-controlled/ Hypoglycemia  *Suspect related to poor oral dietary intake prior to admission  *Also was on long-acting insulin contributed to hypoglycemia especially with poor intake  *Hemoglobin A1c is 10.2 implying poor glycemic control.  *Continue to monitor CBGs which have been more stable. Sliding scale insulin . -Increase Lantus to 42 units at bedtime  ETOH abuse  *Alcohol level less than 11 at presentation  *Currently no signs of alcohol withdrawal.  Dehydration/ Orthostasis/ FTT (failure to thrive) in adult  *Feel this is the primary etiology to patient's initial presentation with syncope and hypotension  *Blood pressure has rebounded nicely with rehydration and holding antihypertensive agents  *PT and OT evaluation  *Patient agreeable to SNF. Bed search is ongoing.  Mild hypoxemic respiratory failure/tobacco abuse  *Chest x-ray only shows parenchymal scarring in no acute process such as CHF or pneumonia  *Continue supportive care with Combivent meter dose inhaler  *Likely has underlying COPD. Since he is wheezing will give neb treatment.  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 In addition there was noted to be widespread metastatic disease to the cervical thoracic spine but no pathological fracture cord deformity or foraminal compromise in the thoracic spine. Cervical assessment was limited because the patient was unable to complete the examination.  *Continue Decadron  *CT head this admission notes interval development of numerous calvarial lucencies concerning for metastatic disease  *Continue OxyContin for pain management  *He will need to follow up with his Oncologist (Dr. Cyndie Chime) for further management. He was under hospice care at home but has been discharged due to non-compliance.  Wounds of LE  *Appears more consistent with vascular insuff  *Broad-spectrum antibiotics were discontinued 9/16. Afebrile so far. WBC normal.  *Wound ostomy care RN evaluation for appropriate wound care  Sepsis  *At this point patient does not meet criteria for sepsis and it is suspicioned that his symptoms are primarily related to dehydration and orthostasis  *Procalcitonin is normal  *Lactic acid was elevated because of low perfusion and dehydration  Encephalopathy acute  *Directly related to hypotension but more so related to hypoglycemia  *Resolved  Hypertension  *Blood pressure has rebounded with adequate hydration  *Consider resuming antihypertensives if BP remains elevated.  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  Found down with hypoglycemia.  CT HEAD WITHOUT CONTRAST  Technique:  Contiguous axial images were obtained from the base of the skull through the vertex without contrast.  Comparison: Head CT 11/29/2008.  Findings: There is stable chronic encephalomalacia inferiorly in the right frontal lobe.  No acute intracranial hemorrhage, mass lesion, brain edema or extra-axial fluid collection is seen. Atrophy appears mildly progressive.  There is no evidence of acute stroke.  There are diffuse intracranial vascular calcifications.  The visualized paranasal sinuses, mastoids and middle ears are clear. The patient has developed multiple ill-defined calvarial lucencies, especially within the right frontal and the left occipital bones. No fractures are identified.  IMPRESSION:  1.  No acute intracranial findings identified. 2.  Stable right frontal lobe encephalomalacia.  Mildly progressive atrophy. 3.  Interval development of numerous calvarial lucencies concerning for possible multiple myeloma or metastatic disease.    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   Original Report Authenticated By: Gerrianne Scale, M.D.    Dg Pelvis Portable  10/11/2011  *RADIOLOGY REPORT*  Clinical Data: Pain  PORTABLE PELVIS  Comparison: 04/24/2007  Findings: Left hip hemiarthroplasty is in place.  No breakage or loosening of the hardware.  Heterotopic ossification has developed about the left greater trochanter. Moderate osteopenia.  No obvious fracture or dislocation.  Vascular calcifications are noted. There is a lytic lesion involving the right superior pubic ramus with loss of the cortex superiorly. There is a focal sclerosis involving the right ischium worrisome for sclerotic lesion.  IMPRESSION: Postoperative and chronic  changes.  No evidence of acute fracture.  Destructive bone lesion involving the right superior pubic ramus. There is also likely a sclerotic bone lesion in the right ischium.   Original Report Authenticated By: Donavan Burnet, M.D.    Dg Chest Port 1 View  10/11/2011  *RADIOLOGY REPORT*  Clinical Data: Chest pain and weakness after falling at home. History of hypertension and diabetes.  Metastatic bone cancer.  PORTABLE CHEST - 1 VIEW  Comparison: Radiographs 08/24/2011.  CT 08/25/2011.  Findings: 1408 hours.  The heart size and mediastinal contours are stable.  There is stable pleural parenchymal scarring at the left costophrenic angle.  No airspace disease or significant pleural effusion is identified.  A lucent lesion and associated pathologic fracture of the left fifth rib appear unchanged.  No new fractures are identified.  IMPRESSION: Stable pleural parenchymal scarring in the left hemithorax.  No acute process identified.   Original Report Authenticated By: Gerrianne Scale, M.D.       Code Status: Full Family Communication: Sister at bedside Disposition Plan: Home when medically stable  Subjective: Patient is feeling well. He is eating better. He is participating in physical therapy. He denies any chest pain, shortness of breath, nausea, vomiting, abdominal pain, dysuria, fevers, chills.  Objective: Filed Vitals:   10/13/11 1400 10/13/11 2145 10/14/11 0538 10/14/11 1300  BP: 180/76 155/66 153/81 140/73  Pulse: 92 85 80 94  Temp: 98.5 F (36.9 C) 99.3 F (37.4 C) 98.2 F (36.8 C) 98.4 F (36.9 C)  TempSrc:  Oral    Resp: 20 18 18 18   Height:      Weight:      SpO2: 99% 95% 98% 97%    Intake/Output Summary (Last 24 hours) at  10/14/11 1547 Last data filed at 10/14/11 1300  Gross per 24 hour  Intake   1000 ml  Output    700 ml  Net    300 ml   Weight change:  Exam:   General:  Pt is alert, follows commands appropriately, not in acute distress  HEENT: No icterus, No  thrush, [place]/AT  Cardiovascular: Regular rate and rhythm, S1/S2, no rubs, no gallops  Respiratory: Clear to auscultation bilaterally, no wheezing, no crackles, no rhonchi  Abdomen: Soft, non tender, non distended, bowel sounds present, no guarding  Extremities:  No lymphangitis, No petechiae, No rashes, no synovitis  Data Reviewed: Basic Metabolic Panel:  Lab 10/14/11 1610 10/13/11 0621 10/12/11 0625 10/11/11 1922 10/11/11 1347 10/09/11 0415  NA 132* 138 137 -- 137 134*  K 4.0 4.3 4.2 -- 4.4 3.8  CL 95* 104 103 -- 98 97  CO2 29 30 24  -- 25 29  GLUCOSE 199* 226* 203* -- 233* 162*  BUN 22 33* 58* -- 70* 15  CREATININE 0.58 0.80 1.52* 2.31* 2.88* --  CALCIUM 8.9 8.9 8.2* -- 7.2* 8.8  MG -- -- -- -- -- --  PHOS -- -- -- -- -- --   Liver Function Tests:  Lab 10/11/11 1347 10/08/11 0342  AST 182* 17  ALT 87* 25  ALKPHOS 476* 484*  BILITOT 0.2* 0.2*  PROT 5.2* 5.2*  ALBUMIN 1.7* 1.9*   No results found for this basename: LIPASE:5,AMYLASE:5 in the last 168 hours No results found for this basename: AMMONIA:5 in the last 168 hours CBC:  Lab 10/14/11 0604 10/13/11 0621 10/12/11 0625 10/11/11 1922 10/11/11 1347  WBC 7.7 7.8 7.9 8.9 9.2  NEUTROABS -- -- -- -- 8.1*  HGB 11.7* 10.6* 10.9* 12.5* 12.7*  HCT 35.4* 32.5* 32.6* 37.0* 37.5*  MCV 84.9 86.2 85.1 84.3 85.8  PLT 151 127* 121* 136* 139*   Cardiac Enzymes:  Lab 10/13/11 0621 10/12/11 0625 10/11/11 2328 10/11/11 1917 10/11/11 1347  CKTOTAL 1550* 4737* -- -- 8506*  CKMB -- -- -- -- --  CKMBINDEX -- -- -- -- --  TROPONINI -- <0.30 <0.30 <0.30 --   BNP: No components found with this basename: POCBNP:5 CBG:  Lab 10/14/11 1126 10/14/11 0707 10/13/11 2156 10/13/11 1635 10/13/11 1116  GLUCAP 239* 206* 331* 343* 281*    Recent Results (from the past 240 hour(s))  CULTURE, BLOOD (ROUTINE X 2)     Status: Normal (Preliminary result)   Collection Time   10/11/11  2:00 PM      Component Value Range Status Comment   Specimen  Description BLOOD LEFT HAND   Final    Special Requests BOTTLES DRAWN AEROBIC ONLY 5CC   Final    Culture  Setup Time 10/11/2011 23:06   Final    Culture     Final    Value:        BLOOD CULTURE RECEIVED NO GROWTH TO DATE CULTURE WILL BE HELD FOR 5 DAYS BEFORE ISSUING A FINAL NEGATIVE REPORT   Report Status PENDING   Incomplete   CULTURE, BLOOD (ROUTINE X 2)     Status: Normal (Preliminary result)   Collection Time   10/11/11  2:07 PM      Component Value Range Status Comment   Specimen Description BLOOD RIGHT HAND   Final    Special Requests BOTTLES DRAWN AEROBIC ONLY 5CC   Final    Culture  Setup Time 10/11/2011 23:06   Final    Culture  Final    Value:        BLOOD CULTURE RECEIVED NO GROWTH TO DATE CULTURE WILL BE HELD FOR 5 DAYS BEFORE ISSUING A FINAL NEGATIVE REPORT   Report Status PENDING   Incomplete   MRSA PCR SCREENING     Status: Normal   Collection Time   10/11/11  6:35 PM      Component Value Range Status Comment   MRSA by PCR NEGATIVE  NEGATIVE Final      Scheduled Meds:   . atorvastatin  20 mg Oral q1800  . dexamethasone  4 mg Oral BID WC  . docusate sodium  100 mg Oral Daily  . enoxaparin (LOVENOX) injection  40 mg Subcutaneous Q24H  . feeding supplement  30 mL Oral Q1200  . folic acid  1 mg Oral Daily  . insulin aspart  0-20 Units Subcutaneous TID WC  . insulin aspart  0-5 Units Subcutaneous QHS  . insulin aspart  6 Units Subcutaneous TID WC  . insulin glargine  24 Units Subcutaneous QHS  . multivitamin with minerals  1 tablet Oral Daily  . oxyCODONE  20 mg Oral Q12H  . pantoprazole  40 mg Oral Q1200  . silver sulfADIAZINE  1 application Topical Daily  . thiamine  100 mg Oral Daily  . vitamin B-12  1,000 mcg Oral Daily   Continuous Infusions:   . sodium chloride 50 mL/hr at 10/13/11 1900     Jamani Bearce, DO  [facility] Pager [phone]  If 7PM-7AM, please contact night-coverage [email or web address] Password [email or web address] 10/14/2011, 3:47 PM   LOS: 3 days

## 2011-10-14 NOTE — Progress Notes (Signed)
Occupational Therapy Evaluation Patient Details Name: Bradley [name] MRN: [id number] DOB: Jan 24, 1946 Today's Date: 10/14/2011 Time: 8295-6213 OT Time Calculation (min): 20 min  OT Assessment / Plan / Recommendation Clinical Impression  Pt admitted with syncope, acute renal failure, rhabdomyolosis and bone mets. Will benefit from acute OT services to address below problem list in prep for d/c to SNF.  Pt is from [place] but reported multiple times throughout session that he does not want to return there.    OT Assessment  Patient needs continued OT Services    Follow Up Recommendations  Supervision/Assistance - 24 hour;Skilled nursing facility (pt does not want to go to [facility])    Barriers to Discharge      Equipment Recommendations  Defer to next venue    Recommendations for Other Services    Frequency  Min 2X/week    Precautions / Restrictions Precautions Precautions: Fall Restrictions Weight Bearing Restrictions: Yes RLE Weight Bearing: Non weight bearing   Pertinent Vitals/Pain Pt c/o dizziness sitting EOB but unable to obtain BP reading. Pt returned to supine position in bed as he was unable to tolerate sitting EOB due to pain and dizziness.    ADL  Eating/Feeding: Performed;Independent Where Assessed - Eating/Feeding: Bed level Upper Body Bathing: Simulated;Set up Where Assessed - Upper Body Bathing: Unsupported sitting Lower Body Bathing: Simulated;Maximal assistance Where Assessed - Lower Body Bathing: Unsupported sitting Upper Body Dressing: Simulated;Set up Where Assessed - Upper Body Dressing: Unsupported sitting Lower Body Dressing: Simulated;Maximal assistance Where Assessed - Lower Body Dressing: Unsupported sitting Transfers/Ambulation Related to ADLs: OOB transfer not attempted due to pain and dizziness ADL Comments: ADL assessment limited due to pain and dizziness upon sitting EOB.    OT Diagnosis: Generalized weakness;Acute pain  OT Problem List:  Decreased strength;Decreased activity tolerance;Pain;Decreased cognition OT Treatment Interventions: Self-care/ADL training;Therapeutic activities;Patient/family education   OT Goals Acute Rehab OT Goals OT Goal Formulation: With patient Time For Goal Achievement: 10/21/11 Potential to Achieve Goals: Good ADL Goals Pt Will Perform Grooming: with modified independence;Unsupported;Sitting, edge of bed ADL Goal: Grooming - Progress: Goal set today Pt Will Transfer to Toilet: Stand pivot transfer;with DME;with min assist;3-in-1 ADL Goal: Toilet Transfer - Progress: Goal set today Miscellaneous OT Goals Miscellaneous OT Goal #1: Pt will perform bed mobility with mod I in prep for EOB ADLs. OT Goal: Miscellaneous Goal #1 - Progress: Goal set today Miscellaneous OT Goal #2: Pt will sit EOB with supervision >5 min in prep for EOB ADLs. OT Goal: Miscellaneous Goal #2 - Progress: Goal set today  Visit Information  Last OT Received On: 10/14/11 Assistance Needed: +2 (for OOB)    Subjective Data      Prior Functioning  Vision/Perception  Home Living Lives With: Alone Available Help at Discharge: Skilled Nursing Facility Type of Home: Skilled Nursing Facility Home Adaptive Equipment: Wheelchair - powered;Walker - rolling Prior Function Comments: Difficult to determine PLOF as pt is inconsistent with information.  States that he was able to do ADLs independently but then reported he needed someone to assist with his showers.  Also reported he ambulated to the dining room every day but at end of session reports he hasn't walked for quite some time. Communication Communication: No difficulties Dominant Hand: Right      Cognition  Overall Cognitive Status: Impaired Arousal/Alertness: Awake/alert Orientation Level: Appears intact for tasks assessed Behavior During Session: [facility] for tasks performed Cognition - Other Comments: Pt providing inconsistent PLOF information.  Questionable  reliability as historian.  Extremity/Trunk Assessment Right Upper Extremity Assessment RUE ROM/Strength/Tone: WFL for tasks assessed (required increased time with movement when reaching overhead) Left Upper Extremity Assessment LUE ROM/Strength/Tone: WFL for tasks assessed (required increased time with movement when reaching overhead)   Mobility  Shoulder Instructions  Bed Mobility Bed Mobility: Supine to Sit;Sitting - Scoot to Edge of Bed;Sit to Supine Supine to Sit: 3: Mod assist;HOB elevated;With rails Sitting - Scoot to Edge of Bed: 3: Mod assist Sit to Supine: 3: Mod assist;HOB elevated Details for Bed Mobility Assistance: VC for sequencing and technique. Assist to support trunk in/out of bed Transfers Transfers: Not assessed       Exercise     Balance Static Sitting Balance Static Sitting - Balance Support: Feet supported Static Sitting - Level of Assistance: 5: Stand by assistance Static Sitting - Comment/# of Minutes: Pt sat EOB ~2 min. Unable to tolerate extended period of time due to dizziness and pain.   End of Session OT - End of Session Activity Tolerance: Patient limited by pain Patient left: in bed;with bed alarm set;with call bell/phone within reach Nurse Communication: Mobility status  GO   10/14/2011 Cipriano Mile OTR/L Pager [phone] Office [phone]   Cipriano Mile 10/14/2011, 5:29 PM

## 2011-10-15 ENCOUNTER — Ambulatory Visit: Payer: Medicare Other | Admitting: Radiation Oncology

## 2011-10-15 DIAGNOSIS — F1010 Alcohol abuse, uncomplicated: Secondary | ICD-10-CM

## 2011-10-15 DIAGNOSIS — D649 Anemia, unspecified: Secondary | ICD-10-CM

## 2011-10-15 LAB — GLUCOSE, CAPILLARY: Glucose-Capillary: 404 mg/dL — ABNORMAL HIGH (ref 70–99)

## 2011-10-15 LAB — CBC
MCHC: 33.5 g/dL (ref 30.0–36.0)
Platelets: 157 10*3/uL (ref 150–400)
RDW: 14.1 % (ref 11.5–15.5)

## 2011-10-15 LAB — BASIC METABOLIC PANEL
Calcium: 9.1 mg/dL (ref 8.4–10.5)
GFR calc Af Amer: >90 mL/min (ref >90)
GFR calc non Af Amer: >90 mL/min (ref >90)
Potassium: 4.3 mEq/L (ref 3.5–5.1)
Sodium: 133 mEq/L — ABNORMAL LOW (ref 135–145)

## 2011-10-15 LAB — LIPID PANEL
Cholesterol: 115 mg/dL (ref 0–200)
VLDL: 25 mg/dL (ref 0–40)

## 2011-10-15 MED ORDER — TIOTROPIUM BROMIDE MONOHYDRATE 18 MCG IN CAPS
18.0000 ug | ORAL_CAPSULE | Freq: Every day | RESPIRATORY_TRACT | Status: DC
  Administered 2011-10-15: 18 ug via RESPIRATORY_TRACT
  Filled 2011-10-15: qty 5

## 2011-10-15 MED ORDER — INSULIN ASPART 100 UNIT/ML ~~LOC~~ SOLN: 10.0000 [IU] | Freq: Three times a day (TID) | SUBCUTANEOUS | Status: AC

## 2011-10-15 MED ORDER — SILVER SULFADIAZINE 1 % EX CREA: 1.0000 "application " | TOPICAL_CREAM | Freq: Every day | CUTANEOUS | Status: AC

## 2011-10-15 MED ORDER — HYDROMORPHONE HCL 4 MG PO TABS: 4.0000 mg | ORAL_TABLET | Freq: Four times a day (QID) | ORAL | Status: AC | PRN

## 2011-10-15 MED ORDER — INSULIN ASPART 100 UNIT/ML ~~LOC~~ SOLN
10.0000 [IU] | Freq: Three times a day (TID) | SUBCUTANEOUS | Status: DC
  Administered 2011-10-15 (×2): 10 [IU] via SUBCUTANEOUS

## 2011-10-15 MED ORDER — INSULIN GLARGINE 100 UNIT/ML ~~LOC~~ SOLN: 50.0000 [IU] | Freq: Every day | SUBCUTANEOUS | Status: AC

## 2011-10-15 MED ORDER — SENNA 8.6 MG PO TABS: 1.0000 | ORAL_TABLET | Freq: Every day | ORAL | Status: AC

## 2011-10-15 MED ORDER — TIOTROPIUM BROMIDE MONOHYDRATE 18 MCG IN CAPS: 18.0000 ug | ORAL_CAPSULE | Freq: Every day | RESPIRATORY_TRACT | Status: AC

## 2011-10-15 MED ORDER — INSULIN GLARGINE 100 UNIT/ML ~~LOC~~ SOLN: 50.0000 [IU] | Freq: Every day | SUBCUTANEOUS | Status: DC

## 2011-10-15 MED ORDER — BUDESONIDE-FORMOTEROL FUMARATE 160-4.5 MCG/ACT IN AERO: 2.0000 | INHALATION_SPRAY | Freq: Two times a day (BID) | RESPIRATORY_TRACT | Status: AC

## 2011-10-15 MED ORDER — OXYCODONE HCL 20 MG PO TB12: 20.0000 mg | ORAL_TABLET | Freq: Two times a day (BID) | ORAL | Status: AC

## 2011-10-15 MED ORDER — LISINOPRIL 20 MG PO TABS
20.0000 mg | ORAL_TABLET | Freq: Every day | ORAL | Status: DC
  Administered 2011-10-15: 20 mg via ORAL
  Filled 2011-10-15: qty 1

## 2011-10-15 MED ORDER — AMLODIPINE BESYLATE 5 MG PO TABS: 5.0000 mg | ORAL_TABLET | Freq: Every day | ORAL | Status: AC

## 2011-10-15 NOTE — Progress Notes (Signed)
Physical Therapy Treatment Patient Details Name: Bradley [name] MRN: [id number] DOB: April 21, 1945 Today's Date: 10/15/2011 Time: 4098-1191 PT Time Calculation (min): 39 min  PT Assessment / Plan / Recommendation Comments on Treatment Session  Pt more alert this session however mild anxiety with transfer.  Spoke with RN tech and highly recommend lift to transfer pt back to bed.  Assisted RN with mobility to perform dressing changes including rolling left/right and performing SLR to apply bilateral LE dressings.      Follow Up Recommendations  Skilled nursing facility    Barriers to Discharge  None      Equipment Recommendations  Defer to next venue    Recommendations for Other Services  None  Frequency Min 2X/week   Plan Discharge plan remains appropriate;Frequency remains appropriate    Precautions / Restrictions Precautions Precautions: Fall Restrictions Weight Bearing Restrictions: Yes RLE Weight Bearing: Non weight bearing   Pertinent Vitals/Pain C/o left LE pain 8/10; premedicated     Mobility  Bed Mobility Bed Mobility: Rolling Right;Rolling Left;Supine to Sit Rolling Right: 3: Mod assist Rolling Left: 3: Mod assist Supine to Sit: 3: Mod assist;HOB elevated;With rails Details for Bed Mobility Assistance: (A) to initiate roll and complete as well as (A) to elevate shoulders OOB with cues for technique Transfers Transfers: Squat Pivot Transfers Squat Pivot Transfers: 1: +2 Total assist;From elevated surface Squat Pivot Transfers: Patient Percentage: 20% Details for Transfer Assistance: +2 (A) to advance hips to recliner with max cues for proper technique    Exercises General Exercises - Lower Extremity Straight Leg Raises: AAROM;Strengthening;Both;5 reps   PT Diagnosis:    PT Problem List:   PT Treatment Interventions:     PT Goals Acute Rehab PT Goals PT Goal Formulation: With patient Time For Goal Achievement: 10/27/11 Potential to Achieve Goals: Fair Pt  will go Supine/Side to Sit: with min assist PT Goal: Supine/Side to Sit - Progress: Progressing toward goal Pt will Transfer Bed to Chair/Chair to Bed: with min assist PT Transfer Goal: Bed to Chair/Chair to Bed - Progress: Progressing toward goal  Visit Information  Last PT Received On: 10/15/11 Assistance Needed: +2    Subjective Data  Subjective: "I'm scared to get over into the chair." Patient Stated Goal: Pt wants to be able to get back to prior level function in his power w/c.   Cognition  Overall Cognitive Status: Appears within functional limits for tasks assessed/performed Arousal/Alertness: Awake/alert Orientation Level: Appears intact for tasks assessed Behavior During Session: [facility] for tasks performed    Balance  Balance Balance Assessed: Yes Static Sitting Balance Static Sitting - Balance Support: Feet supported Static Sitting - Level of Assistance: 4: Min assist Static Sitting - Comment/# of Minutes: (A) to maintain balance and prevent posterior and left lean  End of Session PT - End of Session Equipment Utilized During Treatment: Gait belt Activity Tolerance: Patient limited by pain Patient left: in chair;with call bell/phone within reach Nurse Communication: Need for lift equipment;Precautions   GP     Evanell Redlich 10/15/2011, 9:53 AM Jake Shark, PT DPT [phone]

## 2011-10-15 NOTE — Discharge Summary (Signed)
Physician Discharge Summary  Bradley [name] WUJ:[id number] DOB: 02/11/1945 DOA: 10/11/2011  PCP: Florentina Jenny, MD  Admit date: 10/11/2011 Discharge date: 10/15/2011  Recommendations for Outpatient Follow-up:  1. Pt will need to follow up with PCP in 2-3 weeks post discharge 2. Check BMP, CBC, CPK on 10/19/11 3. Follow up with Dr. Cyndie Chime in 2-3 weeks   ARF (acute renal failure)/Rhabdomyolysis  *Appears to be mediated primarily by volume depletion and poor oral intake prior to admission  *There may be a degree of ATN given the fact patient on an ACE inhibitor prior to admission  *Creatinine trending downward with administration of IV fluids with marked improvement in GFR  *Continue to hold ACE inhibitor for now  *CPK also trending downward now 514  * Will decrease IVF rate.  -hold lipitor for now due to rhabdo -can restart lipitor again in future with close monitoring  DM (diabetes mellitus)-controlled/ Hypoglycemia  *Suspect related to poor oral dietary intake prior to admission  *Also was on long-acting insulin contributed to hypoglycemia especially with poor intake  *Hemoglobin A1c is 10.2 implying poor glycemic control.  -Lantus increased to 50units q hs, which is lower than his previous home dose *Continue to monitor CBGs which have been more stable. Sliding scale insulin. -LDL 57 which is at goal--  ETOH abuse  *Alcohol level less than 11 at presentation  *Currently no signs of alcohol withdrawal.  Dehydration/ Orthostasis/ FTT (failure to thrive) in adult  *Feel this is the primary etiology to patient's initial presentation with syncope and hypotension  *Blood pressure has rebounded nicely with rehydration and holding antihypertensive agents  *PT and OT evaluation  *Patient agreeable to SNF. Bed search is ongoing.  Mild hypoxemic respiratory failure/tobacco abuse  *Chest x-ray only shows parenchymal scarring in no acute process such as CHF or pneumonia  *Start COPD  controlling agents--Spriva and symbicort *Likely has underlying COPD.  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 In addition there was noted to be widespread metastatic disease to the cervical thoracic spine but no pathological fracture cord deformity or foraminal compromise in the thoracic spine. Cervical assessment was limited because the patient was unable to complete the examination.  *Continue Decadron  *CT head this admission notes interval development of numerous calvarial lucencies concerning for metastatic disease  *Continue OxyContin for pain management  *He will need to follow up with his Oncologist (Dr. Cyndie Chime) for further management. He was under hospice care at home but has been discharged due to non-compliance.  Wounds of LE  *Appears more consistent with vascular insuff  *Broad-spectrum antibiotics were discontinued 9/16. Afebrile so far. WBC normal.  *Wound ostomy care RN evaluation for appropriate wound care  Sepsis  *At this point patient does not meet criteria for sepsis and it is suspicioned that his symptoms are primarily related to dehydration and orthostasis  *Procalcitonin is normal  *Lactic acid was elevated because of low perfusion and dehydration  Encephalopathy acute  *Directly related to hypotension but more so related to hypoglycemia  *Resolved  Hypertension  *Blood pressure has rebounded with adequate hydration  *Restart lisinopril 20 mg daily. 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 In addition he has diabetes and ongoing alcohol abuse. He had recently been hospitalized at [facility] long with lower extremity cellulitis. He was sent back to the ER after experiencing a syncopal event at home and suffering a fall. EMS evaluation revealed CBG in the 20s and patient remained persistently hypoglycemic even after arrival to the emergency department. His initial blood pressure was low in the 70s but responded to fluid resuscitation. Initial lactic acid was elevated at 2.8 and he was also experiencing acute renal failure with a creatinine of 2.88. This was compared to baseline 3 days prior to admission of 0.5. He also has evidence of rhabdomyolysis with a CPK greater than 8000. He was admitted to the step down unit for further evaluation and treatment.   Hospital Course:  After the patient was admitted, the patient was fluid resuscitated with improvement of his blood pressure which was initially in the 70s systolic. He was treated empirically with vancomycin and aztreonam for suspected sepsis. As the patient clinically improved and his blood cultures were negative, the antibiotics were discontinued. His lower extremity wounds do not appear to be grossly  infected. Wound care was consulted for recommendations. They continue to recommend Silvadene to the left patient with air mattress to decrease pressure. For his lower extremity wounds, Silvadene was recommended to the left and right foot to assist with removal of nonviable tissue. The patient's acute renal failure was likely due to volume depletion in addition to his ACE inhibitor usage. His creatinine returned to baseline with fluid resuscitation. His CPKs continued to trend down, and it was 514 on the day of discharge. Regarding the patient's hypoglycemia, it was likely due to a combination of his acute renal failure, poor by mouth intake, and prolonged half-life of Lantus insulin due to his acute renal failure. His blood sugars were monitored closely off of insulin. The patient's altered mentation gradually improved with improvement of his hypertension as well as his hypoglycemia. As the patient gradually improved and his oral intake improved, his Lantus was reintroduced. His blood sugars will continue to need close monitoring with adjustment of his Lantus and pre-meal NovoLog in the outpatient setting. Given the patient's history of alcohol abuse, alcohol withdrawal protocol was undertaken; however, the patient did not exhibit any signs of alcohol withdrawal. Will continued improvement, the patient's blood pressure improved to the point of being hypertensive. The patient's lisinopril was restarted. The patient was noted to have some mild hypoxemia. Chest x-ray did not reveal any acute abnormalities. This was felt to be due to his underlying COPD. The patient was started on a Combivent metered-dose inhaler. On the day discharge, the patient had oxygen  saturation of 95-98% on room air. The patient's Decadron was continued for his metastatic adenocarcinoma to the spine and skull. This likely contributed to the patient's hyperglycemia. CT of the head on admission noted numerous calvarial lucencies consistent with  his metastatic disease. The patient was continued on OxyContin for pain management. His pain was well-controlled. The patient will continue to follow with Dr. Cyndie Chime in the outpatient setting. Physical therapy was consulted. It was felt in the best interest of the patient to go to a skilled nursing facility for rehabilitation. Wound Care Nurse Recommendations Silvadene to left and right foot to assist with removal of non-viable tissue until further plan of care is decided. Foam dressing to multiple abrasions on BLE.  Foam dressing to flank to protect and promote healing. Silvadene to left ischium to assist with removal of non-viable tissue. Air mattress to decrease pressure.     Consultants: none  Discharge Exam: Filed Vitals:   10/15/11 0600  BP: 149/67  Pulse: 93  Temp: 98.5 F (36.9 C)  Resp: 18   Filed Vitals:   10/14/11 1300 10/14/11 2201 10/15/11 0600 10/15/11 1308  BP: 140/73 124/55 149/67   Pulse: 94 93 93   Temp: 98.4 F (36.9 C) 98.2 F (36.8 C) 98.5 F (36.9 C)   TempSrc:      Resp: 18 20 18    Height:      Weight:      SpO2: 97% 96% 95% 96%   General: A&O x 3, NAD, pleasant, cooperative Cardiovascular: RRR, no rub, no gallop, no S3 Respiratory: CTAB, no wheeze, no rhonchi Abdomen:soft, nontender, nondistended, positive bowel sounds Extremities: No lymphangitis, No petechiae, No rashes, no synovitis  Discharge Instructions      Discharge Orders    Future Orders Please Complete By Expires   Diet - low sodium heart healthy      Increase activity slowly          Medication List     As of 10/15/2011  2:18 PM    STOP taking these medications         atorvastatin 20 MG tablet   Commonly known as: LIPITOR      doxycycline 100 MG tablet   Commonly known as: VIBRA-TABS      Ipratropium-Albuterol 20-100 MCG/ACT Aers respimat   Commonly known as: COMBIVENT      metroNIDAZOLE 500 MG tablet   Commonly known as: FLAGYL      TAKE these medications          amLODipine 5 MG tablet   Commonly known as: NORVASC   Take 1 tablet (5 mg total) by mouth daily.      budesonide-formoterol 160-4.5 MCG/ACT inhaler   Commonly known as: SYMBICORT   Inhale 2 puffs into the lungs 2 (two) times daily.      collagenase ointment   Commonly known as: SANTYL   Apply topically daily. Apply topically daily. Apply to right ischial tuberosity pressure ulcer (unstageable) daily.  Top with NS gauze, then dry gauze, secure with tape.      dexamethasone 4 MG tablet   Commonly known as: DECADRON   Take 4 mg by mouth 2 (two) times daily with a meal.      docusate sodium 100 MG capsule   Commonly known as: COLACE   Take 100 mg by mouth daily.      folic acid 1 MG tablet   Commonly known as: FOLVITE   Take 1 tablet (1 mg total) by mouth  daily.      HYDROmorphone 4 MG tablet   Commonly known as: DILAUDID   Take 4 mg by mouth every 6 (six) hours as needed. PAIN      insulin aspart 100 UNIT/ML injection   Commonly known as: novoLOG   Inject 10 Units into the skin 3 (three) times daily with meals.      insulin glargine 100 UNIT/ML injection   Commonly known as: LANTUS   Inject 50 Units into the skin at bedtime.      lisinopril 20 MG tablet   Commonly known as: PRINIVIL,ZESTRIL   Take 1 tablet (20 mg total) by mouth daily.      multivitamin with minerals Tabs   Take 1 tablet by mouth daily.      omeprazole 20 MG capsule   Commonly known as: PRILOSEC   Take 1 capsule (20 mg total) by mouth daily.      oxyCODONE 20 MG 12 hr tablet   Commonly known as: OXYCONTIN   Take 1 tablet (20 mg total) by mouth every 12 (twelve) hours.      senna 8.6 MG Tabs   Commonly known as: SENOKOT   Take 1 tablet (8.6 mg total) by mouth at bedtime.      silver sulfADIAZINE 1 % cream   Commonly known as: SILVADENE   Apply 1 application topically daily. Apply to ishial and leg wounds daily      thiamine 100 MG tablet   Take 1 tablet (100 mg total) by mouth daily.        tiotropium 18 MCG inhalation capsule   Commonly known as: SPIRIVA   Place 1 capsule (18 mcg total) into inhaler and inhale daily.      traZODone 50 MG tablet   Commonly known as: DESYREL   Take 50 mg by mouth at bedtime.      vitamin B-12 1000 MCG tablet   Commonly known as: CYANOCOBALAMIN   Take 1,000 mcg by mouth daily.          The results of significant diagnostics from this hospitalization (including imaging, microbiology, ancillary and laboratory) are listed below for reference.    Significant Diagnostic Studies: Ct Head Wo Contrast  10/11/2011  *RADIOLOGY REPORT*  Clinical Data: Altered mental status.  Found down with hypoglycemia.  CT HEAD WITHOUT CONTRAST  Technique:  Contiguous axial images were obtained from the base of the skull through the vertex without contrast.  Comparison: Head CT 11/29/2008.  Findings: There is stable chronic encephalomalacia inferiorly in the right frontal lobe.  No acute intracranial hemorrhage, mass lesion, brain edema or extra-axial fluid collection is seen. Atrophy appears mildly progressive.  There is no evidence of acute stroke.  There are diffuse intracranial vascular calcifications.  The visualized paranasal sinuses, mastoids and middle ears are clear. The patient has developed multiple ill-defined calvarial lucencies, especially within the right frontal and the left occipital bones. No fractures are identified.  IMPRESSION:  1.  No acute intracranial findings identified. 2.  Stable right frontal lobe encephalomalacia.  Mildly progressive atrophy. 3.  Interval development of numerous calvarial lucencies concerning for possible multiple myeloma or metastatic disease.    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   Original Report Authenticated By: Gerrianne Scale, M.D.    Dg Pelvis Portable  10/11/2011  *RADIOLOGY REPORT*  Clinical Data: Pain  PORTABLE PELVIS   Comparison: 04/24/2007  Findings: Left hip  hemiarthroplasty is in place.  No breakage or loosening of the hardware.  Heterotopic ossification has developed about the left greater trochanter. Moderate osteopenia.  No obvious fracture or dislocation.  Vascular calcifications are noted. There is a lytic lesion involving the right superior pubic ramus with loss of the cortex superiorly. There is a focal sclerosis involving the right ischium worrisome for sclerotic lesion.  IMPRESSION: Postoperative and chronic changes.  No evidence of acute fracture.  Destructive bone lesion involving the right superior pubic ramus. There is also likely a sclerotic bone lesion in the right ischium.   Original Report Authenticated By: Donavan Burnet, M.D.    Dg Chest Port 1 View  10/11/2011  *RADIOLOGY REPORT*  Clinical Data: Chest pain and weakness after falling at home. History of hypertension and diabetes.  Metastatic bone cancer.  PORTABLE CHEST - 1 VIEW  Comparison: Radiographs 08/24/2011.  CT 08/25/2011.  Findings: 1408 hours.  The heart size and mediastinal contours are stable.  There is stable pleural parenchymal scarring at the left costophrenic angle.  No airspace disease or significant pleural effusion is identified.  A lucent lesion and associated pathologic fracture of the left fifth rib appear unchanged.  No new fractures are identified.  IMPRESSION: Stable pleural parenchymal scarring in the left hemithorax.  No acute process identified.   Original Report Authenticated By: Gerrianne Scale, M.D.      Microbiology: Recent Results (from the past 240 hour(s))  CULTURE, BLOOD (ROUTINE X 2)     Status: Normal (Preliminary result)   Collection Time   10/11/11  2:00 PM      Component Value Range Status Comment   Specimen Description BLOOD LEFT HAND   Final    Special Requests BOTTLES DRAWN AEROBIC ONLY 5CC   Final    Culture  Setup Time 10/11/2011 23:06   Final    Culture     Final    Value:        BLOOD CULTURE  RECEIVED NO GROWTH TO DATE CULTURE WILL BE HELD FOR 5 DAYS BEFORE ISSUING A FINAL NEGATIVE REPORT   Report Status PENDING   Incomplete   CULTURE, BLOOD (ROUTINE X 2)     Status: Normal (Preliminary result)   Collection Time   10/11/11  2:07 PM      Component Value Range Status Comment   Specimen Description BLOOD RIGHT HAND   Final    Special Requests BOTTLES DRAWN AEROBIC ONLY 5CC   Final    Culture  Setup Time 10/11/2011 23:06   Final    Culture     Final    Value:        BLOOD CULTURE RECEIVED NO GROWTH TO DATE CULTURE WILL BE HELD FOR 5 DAYS BEFORE ISSUING A FINAL NEGATIVE REPORT   Report Status PENDING   Incomplete   MRSA PCR SCREENING     Status: Normal   Collection Time   10/11/11  6:35 PM      Component Value Range Status Comment   MRSA by PCR NEGATIVE  NEGATIVE Final      Labs: Basic Metabolic Panel:  Lab 10/15/11 1610 10/14/11 0604 10/13/11 0621 10/12/11 0625 10/11/11 1922 10/11/11 1347  NA 133* 132* 138 137 -- 137  K 4.3 4.0 -- -- -- --  CL 95* 95* 104 103 -- 98  CO2 31 29 30 24  -- 25  GLUCOSE 304* 199* 226* 203* -- 233*  BUN 24* 22 33* 58* -- 70*  CREATININE 0.64 0.58  0.80 1.52* 2.31* --  CALCIUM 9.1 8.9 8.9 8.2* -- 7.2*  MG -- -- -- -- -- --  PHOS -- -- -- -- -- --   Liver Function Tests:  Lab 10/11/11 1347  AST 182*  ALT 87*  ALKPHOS 476*  BILITOT 0.2*  PROT 5.2*  ALBUMIN 1.7*   No results found for this basename: LIPASE:5,AMYLASE:5 in the last 168 hours No results found for this basename: AMMONIA:5 in the last 168 hours CBC:  Lab 10/15/11 0605 10/14/11 0604 10/13/11 0621 10/12/11 0625 10/11/11 1922 10/11/11 1347  WBC 9.3 7.7 7.8 7.9 8.9 --  NEUTROABS -- -- -- -- -- 8.1*  HGB 11.5* 11.7* 10.6* 10.9* 12.5* --  HCT 34.3* 35.4* 32.5* 32.6* 37.0* --  MCV 84.1 84.9 86.2 85.1 84.3 --  PLT 157 151 127* 121* 136* --   Cardiac Enzymes:  Lab 10/15/11 0605 10/13/11 0621 10/12/11 0625 10/11/11 2328 10/11/11 1917 10/11/11 1347  CKTOTAL 514* 1550* 4737* -- --  8506*  CKMB -- -- -- -- -- --  CKMBINDEX -- -- -- -- -- --  TROPONINI -- -- <0.30 <0.30 <0.30 --   BNP: No components found with this basename: POCBNP:5 CBG:  Lab 10/15/11 1102 10/15/11 0802 10/15/11 0632 10/15/11 0423 10/15/11 0106  GLUCAP 219* 334* 313* 360* 404*    Time coordinating discharge:  Greater than 30 minutes  Signed:  Kili Gracy, DO [facility] Pager: [phone] 10/15/2011, 2:18 PM

## 2011-10-15 NOTE — Progress Notes (Signed)
Patient with CBG of 440 at 2142 this shift.  Spoke with Maren Reamer, NP, new order for one time dose of 5 units Novolog STAT.  CBG rechecked at 0106 to be 404, spoke with Maren Reamer, NP again, asked for staff to "recheck CBG in a few hours and call her back if CBG was higher than current value."  CBG checked at 0423 to be 360.  Will continue to monitor.

## 2011-10-15 NOTE — Progress Notes (Signed)
Nutrition Follow-up  Intervention:   1.  General healthful diet; continue current management.  Pt may continue Prostat as desired.  Encouraged continued good intake.  Assessment:   Pt continues consuming 100% of meals and Prostat once daily for additional 100 kcal, 15g protein.   Pt denies additional nutrition needs or concern for his nutrition status.  He is pleased he is eating well and feels he will continue to eat well as he has a good appetite.    Diet Order:  Regular  Meds: Scheduled Meds:   . atorvastatin  20 mg Oral q1800  . dexamethasone  4 mg Oral BID WC  . docusate sodium  100 mg Oral Daily  . enoxaparin (LOVENOX) injection  40 mg Subcutaneous Q24H  . feeding supplement  30 mL Oral Q1200  . folic acid  1 mg Oral Daily  . insulin aspart  0-20 Units Subcutaneous TID WC  . insulin aspart  0-5 Units Subcutaneous QHS  . insulin aspart  10 Units Subcutaneous TID WC  . insulin aspart  5 Units Subcutaneous Once  . insulin glargine  50 Units Subcutaneous QHS  . lisinopril  20 mg Oral Daily  . multivitamin with minerals  1 tablet Oral Daily  . oxyCODONE  20 mg Oral Q12H  . pantoprazole  40 mg Oral Q1200  . senna  1 tablet Oral QHS  . silver sulfADIAZINE  1 application Topical Daily  . thiamine  100 mg Oral Daily  . tiotropium  18 mcg Inhalation Daily  . vitamin B-12  1,000 mcg Oral Daily  . DISCONTD: insulin aspart  5 Units Subcutaneous Once  . DISCONTD: insulin aspart  6 Units Subcutaneous TID WC  . DISCONTD: insulin glargine  24 Units Subcutaneous QHS  . DISCONTD: insulin glargine  42 Units Subcutaneous QHS   Continuous Infusions:   . sodium chloride 50 mL/hr at 10/15/11 0219   PRN Meds:.acetaminophen, albuterol, ondansetron (ZOFRAN) IV, ondansetron, oxyCODONE, senna-docusate  Labs:  CMP     Component Value Date/Time   NA 133* 10/15/2011 0605   K 4.3 10/15/2011 0605   CL 95* 10/15/2011 0605   CO2 31 10/15/2011 0605   GLUCOSE 304* 10/15/2011 0605   BUN 24* 10/15/2011  0605   CREATININE 0.64 10/15/2011 0605   CALCIUM 9.1 10/15/2011 0605   PROT 5.2* 10/11/2011 1347   ALBUMIN 1.7* 10/11/2011 1347   AST 182* 10/11/2011 1347   ALT 87* 10/11/2011 1347   ALKPHOS 476* 10/11/2011 1347   BILITOT 0.2* 10/11/2011 1347   GFRNONAA >90 10/15/2011 0605   GFRAA >90 10/15/2011 0605     Intake/Output Summary (Last 24 hours) at 10/15/11 1203 Last data filed at 10/15/11 0806  Gross per 24 hour  Intake    120 ml  Output   2300 ml  Net  -2180 ml    Weight Status:  No new wt  Re-estimated needs:  2100-2300 kcal, 110-120g, 2.1L-2.3L  Nutrition Dx:  Increased nutrient needs, ongoing  Monitor:   Goal: Oral intake with meals & supplements to meet >/= 90% of estimated nutrition needs  Monitor: PO & supplemental intake, weight, labs, I/O's   Loyce Dys, MS RD LDN Clinical Inpatient Dietitian Pager: [phone] Weekend/After hours pager: [phone]

## 2011-10-16 ENCOUNTER — Other Ambulatory Visit: Payer: Self-pay | Admitting: Adult Health

## 2011-10-16 DIAGNOSIS — M79673 Pain in unspecified foot: Secondary | ICD-10-CM

## 2011-10-17 LAB — CULTURE, BLOOD (ROUTINE X 2): Culture: NO GROWTH

## 2011-10-20 ENCOUNTER — Ambulatory Visit
Admission: RE | Admit: 2011-10-20 | Discharge: 2011-10-20 | Disposition: A | Discharge status: Home / Self Care | Payer: No Typology Code available for payment source | Source: Ambulatory Visit | Attending: Adult Health | Admitting: Adult Health

## 2011-10-20 DIAGNOSIS — M79673 Pain in unspecified foot: Secondary | ICD-10-CM

## 2011-10-20 MED ORDER — GADOBENATE DIMEGLUMINE 529 MG/ML IV SOLN
10.0000 mL | Freq: Once | INTRAVENOUS | Status: AC | PRN
  Administered 2011-10-20: 10 mL via INTRAVENOUS

## 2011-10-26 ENCOUNTER — Telehealth: Payer: Self-pay | Admitting: *Deleted

## 2011-10-26 NOTE — Telephone Encounter (Signed)
Spoke with [employer].  Let her know that per Dr.Manning's note 09/22/11 that patient is to return to see him (not Dr. Cyndie Chime) in one month.  Instructed her to call Radiation Therapy and make that appt.

## 2011-11-03 ENCOUNTER — Encounter: Payer: Self-pay | Admitting: Radiation Oncology

## 2011-11-03 DIAGNOSIS — C801 Malignant (primary) neoplasm, unspecified: Secondary | ICD-10-CM | POA: Insufficient documentation

## 2011-11-03 DIAGNOSIS — Z923 Personal history of irradiation: Secondary | ICD-10-CM | POA: Insufficient documentation

## 2011-11-03 DIAGNOSIS — C7951 Secondary malignant neoplasm of bone: Secondary | ICD-10-CM | POA: Insufficient documentation

## 2011-11-05 ENCOUNTER — Encounter: Payer: Self-pay | Admitting: Radiation Oncology

## 2011-11-05 ENCOUNTER — Ambulatory Visit
Admission: RE | Admit: 2011-11-05 | Discharge: 2011-11-05 | Disposition: A | Discharge status: Home / Self Care | Source: Ambulatory Visit | Attending: Radiation Oncology | Admitting: Radiation Oncology

## 2011-11-05 VITALS — BP 120/49 | HR 116 | Temp 98.9°F | Resp 20

## 2011-11-05 DIAGNOSIS — K219 Gastro-esophageal reflux disease without esophagitis: Secondary | ICD-10-CM

## 2011-11-05 DIAGNOSIS — M545 Low back pain, unspecified: Secondary | ICD-10-CM

## 2011-11-05 DIAGNOSIS — Z923 Personal history of irradiation: Secondary | ICD-10-CM

## 2011-11-05 DIAGNOSIS — N179 Acute kidney failure, unspecified: Secondary | ICD-10-CM

## 2011-11-05 DIAGNOSIS — E119 Type 2 diabetes mellitus without complications: Secondary | ICD-10-CM

## 2011-11-05 DIAGNOSIS — C801 Malignant (primary) neoplasm, unspecified: Secondary | ICD-10-CM

## 2011-11-05 DIAGNOSIS — Z720 Tobacco use: Secondary | ICD-10-CM

## 2011-11-05 NOTE — Progress Notes (Signed)
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  His back pain is better.                              ALLERGIES:  is allergic to penicillins.  Meds: Current Outpatient Prescriptions  Medication Sig Dispense Refill  . amLODipine (NORVASC) 5 MG tablet Take 1 tablet (5 mg total) by mouth daily.  30 tablet  3  . budesonide-formoterol (SYMBICORT) 160-4.5 MCG/ACT inhaler Inhale 2 puffs into the lungs 2 (two) times daily.  1 Inhaler  3  . collagenase (SANTYL) ointment Apply 1 application topically daily. Cleanse unstage able pu to left ischial tuberosity , left knee, , diabetic ulcer to right dorsal  footw/ ns/wound cleanser,apply santyl to eschar,cover with Tegaderm,plus pad daily & prn/soiled/missing dressing      . dexamethasone (DECADRON) 4 MG tablet Take 4 mg by mouth 2 (two) times daily with a meal.      . docusate sodium (COLACE) 100 MG capsule Take 100 mg by mouth daily.      . folic acid (FOLVITE) 1 MG tablet Take 1 tablet (1 mg total) by mouth daily.  30 tablet  0  . HYDROmorphone (DILAUDID) 4 MG tablet Take 1 tablet (4 mg total) by mouth every 6 (six) hours as needed (severe pain). PAIN  60 tablet  0  . insulin aspart (NOVOLOG) 100 UNIT/ML injection Inject 10 Units into the skin 3 (three) times daily with meals.  1 vial  3  . insulin glargine (LANTUS) 100 UNIT/ML injection Inject 50 Units into the skin at bedtime.  10 mL  3  . lisinopril (PRINIVIL,ZESTRIL) 20 MG tablet Take 1 tablet (20 mg total) by mouth daily.  30 tablet  1  . Multiple Vitamin (MULTIVITAMIN WITH MINERALS) TABS Take 1 tablet by mouth daily.  30 tablet  0  . omeprazole (PRILOSEC) 20 MG capsule Take 1 capsule  (20 mg total) by mouth daily.  30 capsule  0  . oxyCODONE (OXYCONTIN) 20 MG 12 hr tablet Take 1 tablet (20 mg total) by mouth every 12 (twelve) hours.  60 tablet  0  . oxyCODONE (OXYCONTIN) 20 MG 12 hr tablet Take 1 tablet (20 mg total) by mouth every 12 (twelve) hours.  60 tablet  0  . senna (SENOKOT) 8.6 MG TABS Take 1 tablet (8.6 mg total) by mouth at bedtime.  30 tablet  3  . thiamine 100 MG tablet Take 1 tablet (100 mg total) by mouth daily.  30 tablet  0  . tiotropium (SPIRIVA) 18 MCG inhalation capsule Place 1 capsule (18 mcg total) into inhaler and inhale daily.  30 capsule  3  . traZODone (DESYREL) 50 MG tablet Take 50 mg by mouth at bedtime.      . vitamin B-12 (CYANOCOBALAMIN) 1000 MCG tablet Take 1,000 mcg by mouth daily.      . silver sulfADIAZINE (SILVADENE) 1 % cream Apply 1 application topically daily. Apply to ishial and leg wounds daily  50 g  3    Physical Findings: The patient is in no acute distress. Patient is alert and  oriented.  oral temperature is 98.9 F (37.2 C). His blood pressure is 120/49 and his pulse is 116. His respiration is 20 and oxygen saturation is 98%. .  No significant changes.  Lab Findings: Lab Results  Component Value Date   WBC 9.3 10/15/2011   HGB 11.5* 10/15/2011   HCT 34.3* 10/15/2011   MCV 84.1 10/15/2011   PLT 157 10/15/2011    @LASTCHEM @  Radiographic Findings: Ct Head Wo Contrast  10/11/2011  *RADIOLOGY REPORT*  Clinical Data: Altered mental status.  Found down with hypoglycemia.  CT HEAD WITHOUT CONTRAST  Technique:  Contiguous axial images were obtained from the base of the skull through the vertex without contrast.  Comparison: Head CT 11/29/2008.  Findings: There is stable chronic encephalomalacia inferiorly in the right frontal lobe.  No acute intracranial hemorrhage, mass lesion, brain edema or extra-axial fluid collection is seen. Atrophy appears mildly progressive.  There is no evidence of acute stroke.  There are diffuse  intracranial vascular calcifications.  The visualized paranasal sinuses, mastoids and middle ears are clear. The patient has developed multiple ill-defined calvarial lucencies, especially within the right frontal and the left occipital bones. No fractures are identified.  IMPRESSION:  1.  No acute intracranial findings identified. 2.  Stable right frontal lobe encephalomalacia.  Mildly progressive atrophy. 3.  Interval development of numerous calvarial lucencies concerning for possible multiple myeloma or metastatic disease.    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   Original Report Authenticated By: Gerrianne Scale, M.D.    Dg Pelvis Portable  10/11/2011  *RADIOLOGY REPORT*  Clinical Data: Pain  PORTABLE PELVIS  Comparison: 04/24/2007  Findings: Left hip hemiarthroplasty is in place.  No breakage or loosening of the hardware.  Heterotopic ossification has developed about the left greater trochanter. Moderate osteopenia.  No obvious fracture or dislocation.  Vascular calcifications are noted. There is a lytic lesion involving the right superior pubic ramus with loss of the cortex superiorly. There is a focal sclerosis involving the right ischium worrisome for sclerotic lesion.  IMPRESSION: Postoperative and chronic changes.  No evidence of acute fracture.  Destructive bone lesion involving the right superior pubic ramus. There is also likely a sclerotic bone lesion in the right ischium.   Original Report Authenticated By: Donavan Burnet, M.D.    Mr Foot Right W Wo Contrast  10/20/2011  *RADIOLOGY REPORT*  Clinical Data: Foot pain.  MRI OF THE RIGHT FOREFOOT WITHOUT AND WITH CONTRAST  Technique:  Multiplanar, multisequence MR imaging was performed both before and after administration of intravenous contrast.  Contrast:  10 ml Multihance  Comparison: Radiographs dated 07/12 and 07/15/2005  Findings: There is slight edema and  abnormal enhancement of the dorsal aspect of the remnant of the base of the first metatarsal. There is no cortical destruction.  Does the patient have a soft tissue wound at that site?  The other bones of the hind foot demonstrate no acute abnormalities.  Slight arthritic changes of the ankle joint with a tiny ankle joint effusion.  The tendons and ligaments at the ankle are intact.  IMPRESSION:  1.  Mild osteoarthritis of the ankle joint with a small ankle effusion. 2.  Focal edema and enhancement of the dorsal aspect of the remnant of the base of the first metatarsal.  This could be due to hyperemia secondary to adjacent infection.  No cortical destruction to suggest definitive osteomyelitis.  Original Report Authenticated By: Gwynn Burly, M.D.    Mr Foot Left W Wo Contrast  10/20/2011  *RADIOLOGY REPORT*  Clinical Data: Diabetes.  Foot pain.  Abscess.  MRI OF THE LEFT FOREFOOT WITHOUT AND WITH CONTRAST  Technique:  Multiplanar, multisequence MR imaging was performed both before and after administration of intravenous contrast.  Contrast: 10mL MULTIHANCE GADOBENATE DIMEGLUMINE 529 MG/ML IV SOLN  Comparison: None.  Findings: Despite efforts by the patient and technologist, motion artifact is present on some series of today's examination and could not be totally eliminated.  This reduces diagnostic sensitivity and specificity.  Gas is present in the soft tissues along the lateral dorsum of the foot.  No abnormal osseous edema is observed favor osteomyelitis.  No definite drainable abscess is noted.  Low-level edema tracts along the plantar musculature of the foot, and mild myositis is not excluded.  Alignment at the Lisfranc joint is grossly intact and the Lisfranc ligament itself is intact.  IMPRESSION:  1.  Abnormal gas and fluid in the soft tissues along the lateral dorsum of the foot, particularly in the subcutaneous tissues, compatible with ulceration and cellulitis. Physical exam assessment for  necrotic tissue recommended if not recently performed.  No underlying osteomyelitis or discrete drainable abscess observed.   Original Report Authenticated By: Dellia Cloud, M.D.    Dg Chest Port 1 View  10/11/2011  *RADIOLOGY REPORT*  Clinical Data: Chest pain and weakness after falling at home. History of hypertension and diabetes.  Metastatic bone cancer.  PORTABLE CHEST - 1 VIEW  Comparison: Radiographs 08/24/2011.  CT 08/25/2011.  Findings: 1408 hours.  The heart size and mediastinal contours are stable.  There is stable pleural parenchymal scarring at the left costophrenic angle.  No airspace disease or significant pleural effusion is identified.  A lucent lesion and associated pathologic fracture of the left fifth rib appear unchanged.  No new fractures are identified.  IMPRESSION: Stable pleural parenchymal scarring in the left hemithorax.  No acute process identified.   Original Report Authenticated By: Gerrianne Scale, M.D.     Impression:  The patient is recovering from the effects of radiation.  His back pain is doing better.  Plan:  Follow-up as needed.  _____________________________________  Artist Pais Kathrynn Running, M.D.

## 2011-11-05 NOTE — Progress Notes (Addendum)
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 Pt has dressing on both feet, left forearm; he states he has "sore on his arm". Pt residing in [facility]. Pt states he has occasional prod cough w/white phlegm, denies sob, fatigue, loss of appetite. Pt states he has pain in his back, takes pain med prn w/good relief.

## 2011-12-27 DEATH — deceased

## 2013-08-15 IMAGING — CT CT HEAD W/O CM
1 of 2 series · 13 of 30 positions shown, 17 images · non-contrast
Comparison: Head CT 11/29/2008.

CLINICAL DATA: Altered mental status.  Found down with
hypoglycemia.

CT HEAD WITHOUT CONTRAST
TECHNIQUE: Contiguous axial images were obtained from the base of
the skull through the vertex without contrast.

[Series 2: brain · axial · 0.49mm/px · z∈[+139,+278]mm · 13 of 32 slices shown, 17 images]
[im 3/32  brain]
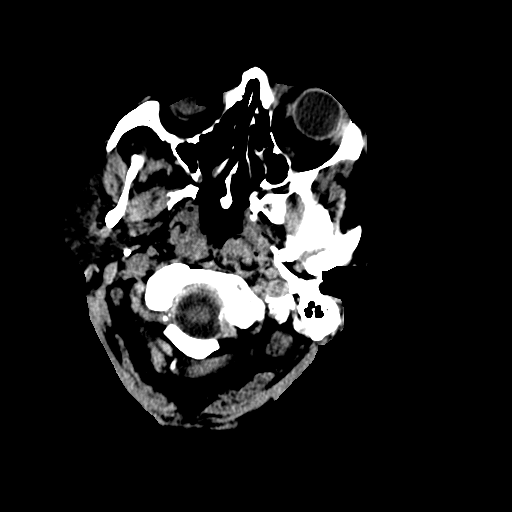
[im 3/32  bone]
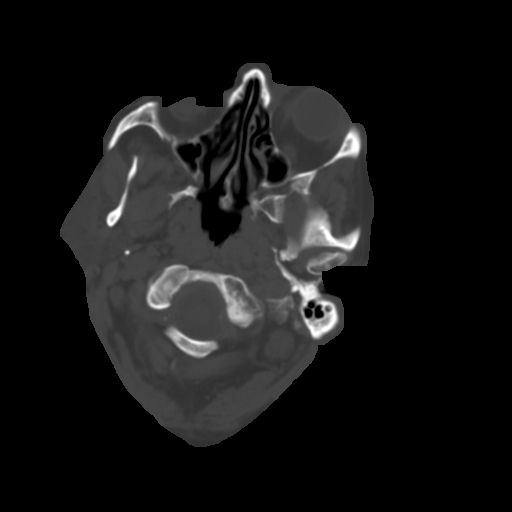
[im 5/32  brain]
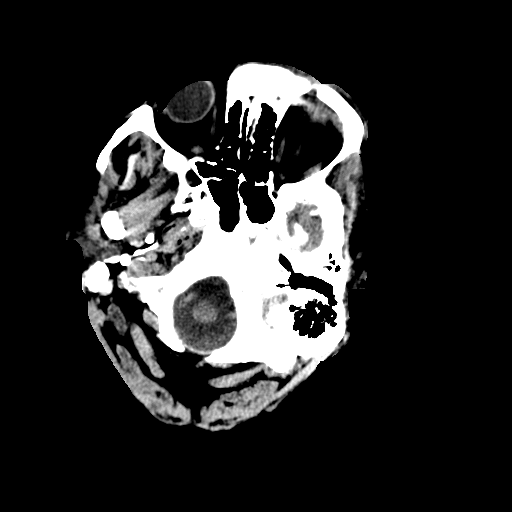
[im 7/32  brain]
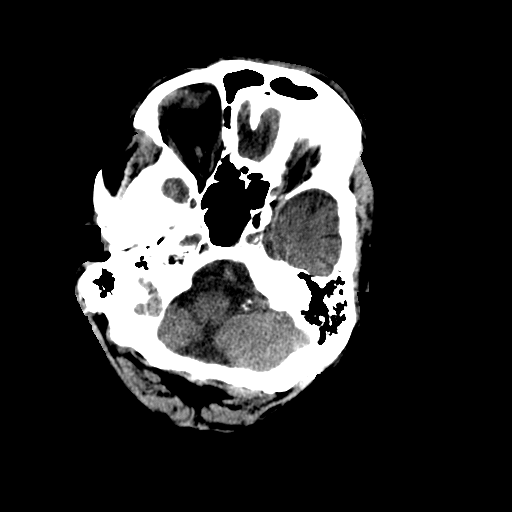
[im 9/32  brain]
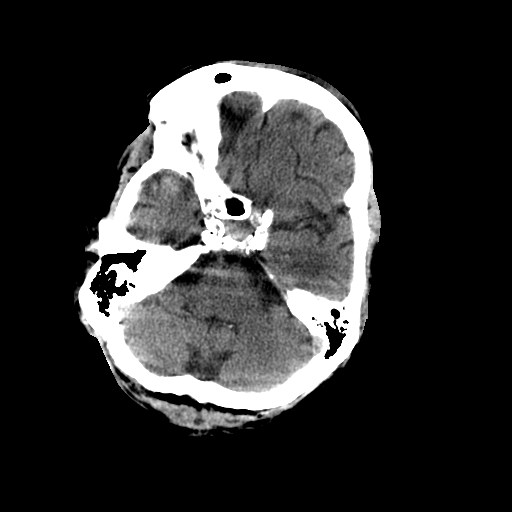
[im 12/32  brain]
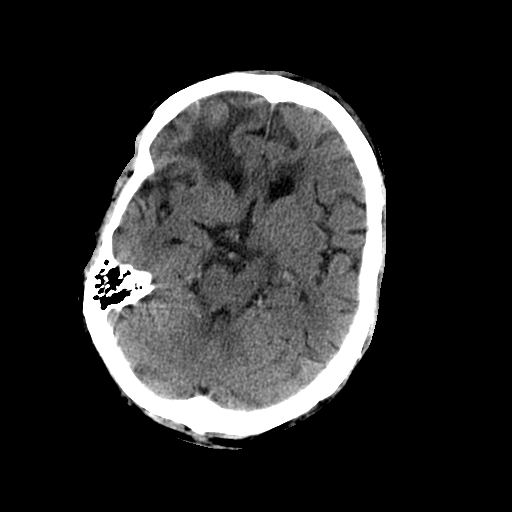
[im 12/32  bone]
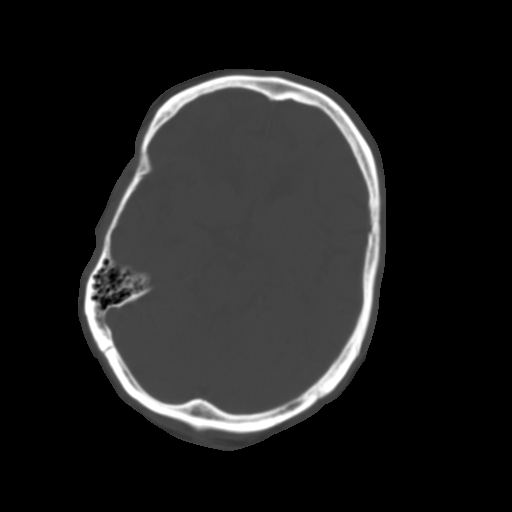
[im 14/32  brain]
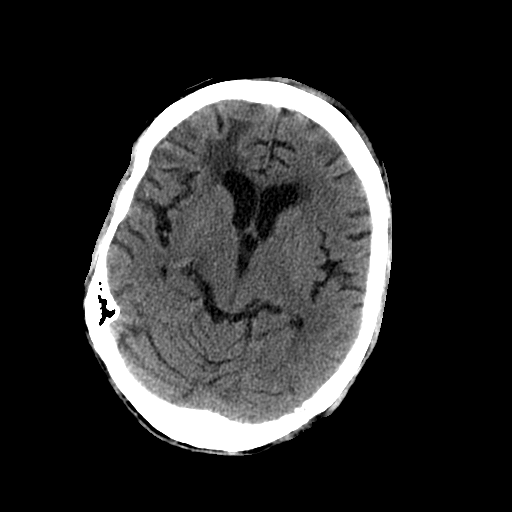
[im 16/32  brain]
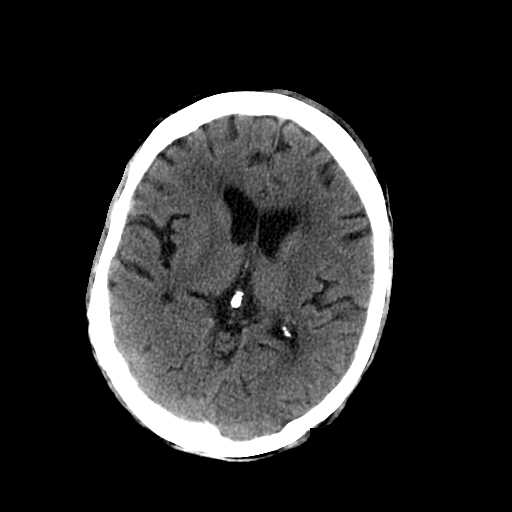
[im 18/32  brain]
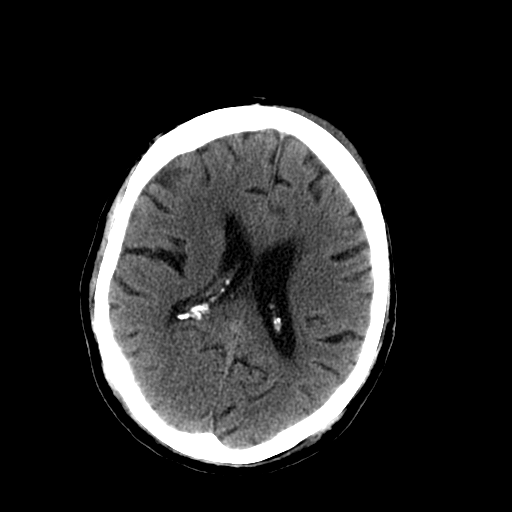
[im 20/32  brain]
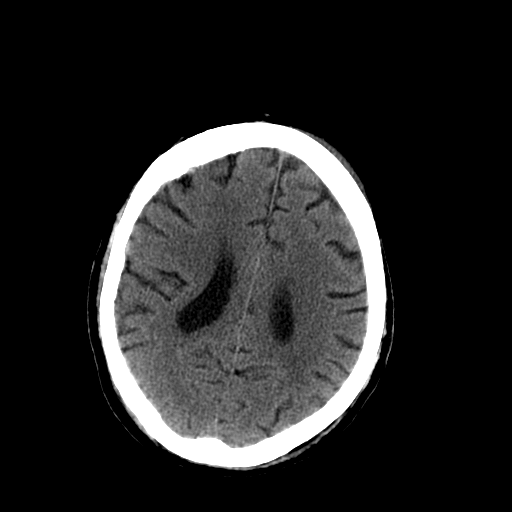
[im 20/32  bone]
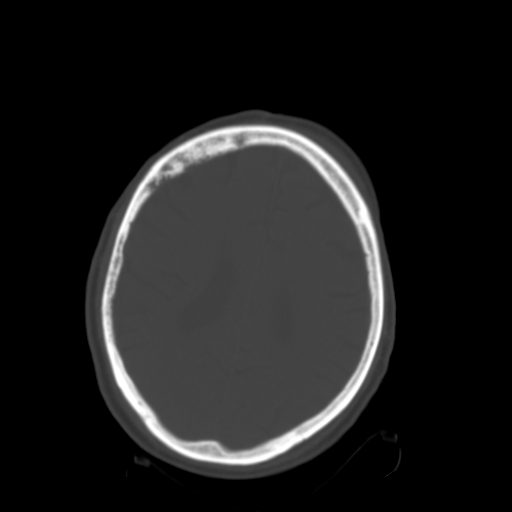
[im 23/32  brain]
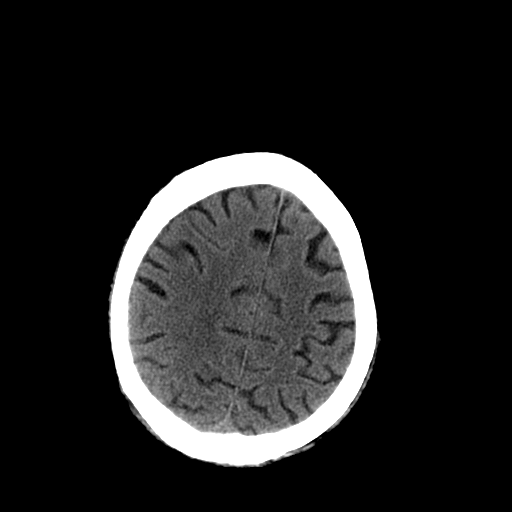
[im 25/32  brain]
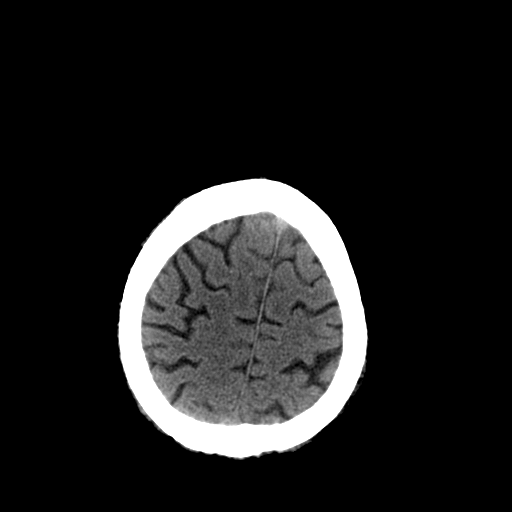
[im 27/32  brain]
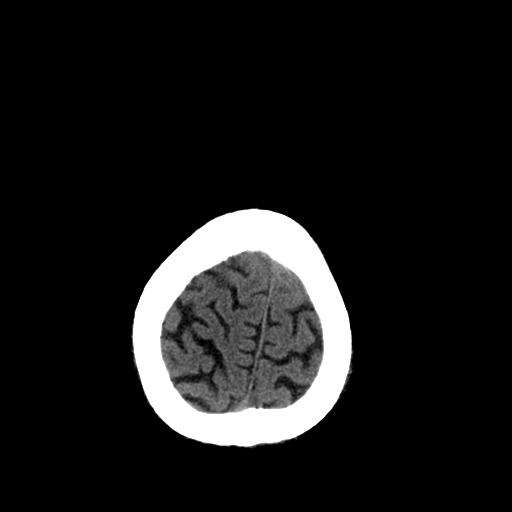
[im 29/32  brain]
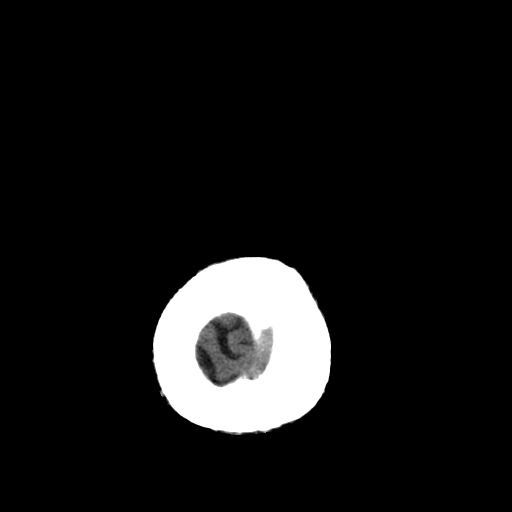
[im 29/32  bone]
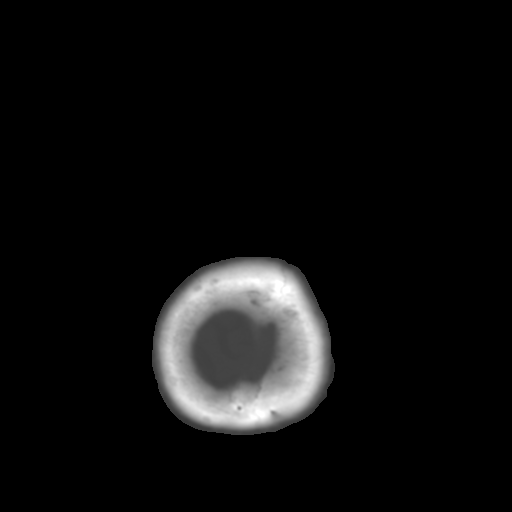

[13 of 30 positions shown; findings below may reference images not displayed]

FINDINGS: There is stable chronic encephalomalacia inferiorly in
the right frontal lobe.  No acute intracranial hemorrhage, mass
lesion, brain edema or extra-axial fluid collection is seen.
Atrophy appears mildly progressive.  There is no evidence of acute
stroke.

There are diffuse intracranial vascular calcifications.  The
visualized paranasal sinuses, mastoids and middle ears are clear.
The patient has developed multiple ill-defined calvarial lucencies,
especially within the right frontal and the left occipital bones.
No fractures are identified.
IMPRESSION: 1.  No acute intracranial findings identified.
2.  Stable right frontal lobe encephalomalacia.  Mildly progressive
atrophy.
3.  Interval development of numerous calvarial lucencies concerning
for possible multiple myeloma or metastatic disease.    In review
of the prior left iliac bone biopsies performed 09/14/2011, the
patient has metastatic adenocarcinoma to the bone.

## 2013-08-15 IMAGING — CR DG CHEST 1V PORT
1 series · 1 of 1 positions shown · non-contrast
Comparison: Radiographs 08/24/2011.  CT 08/25/2011.

CLINICAL DATA: Chest pain and weakness after falling at home.
History of hypertension and diabetes.  Metastatic bone cancer.

PORTABLE CHEST - 1 VIEW

[AP]
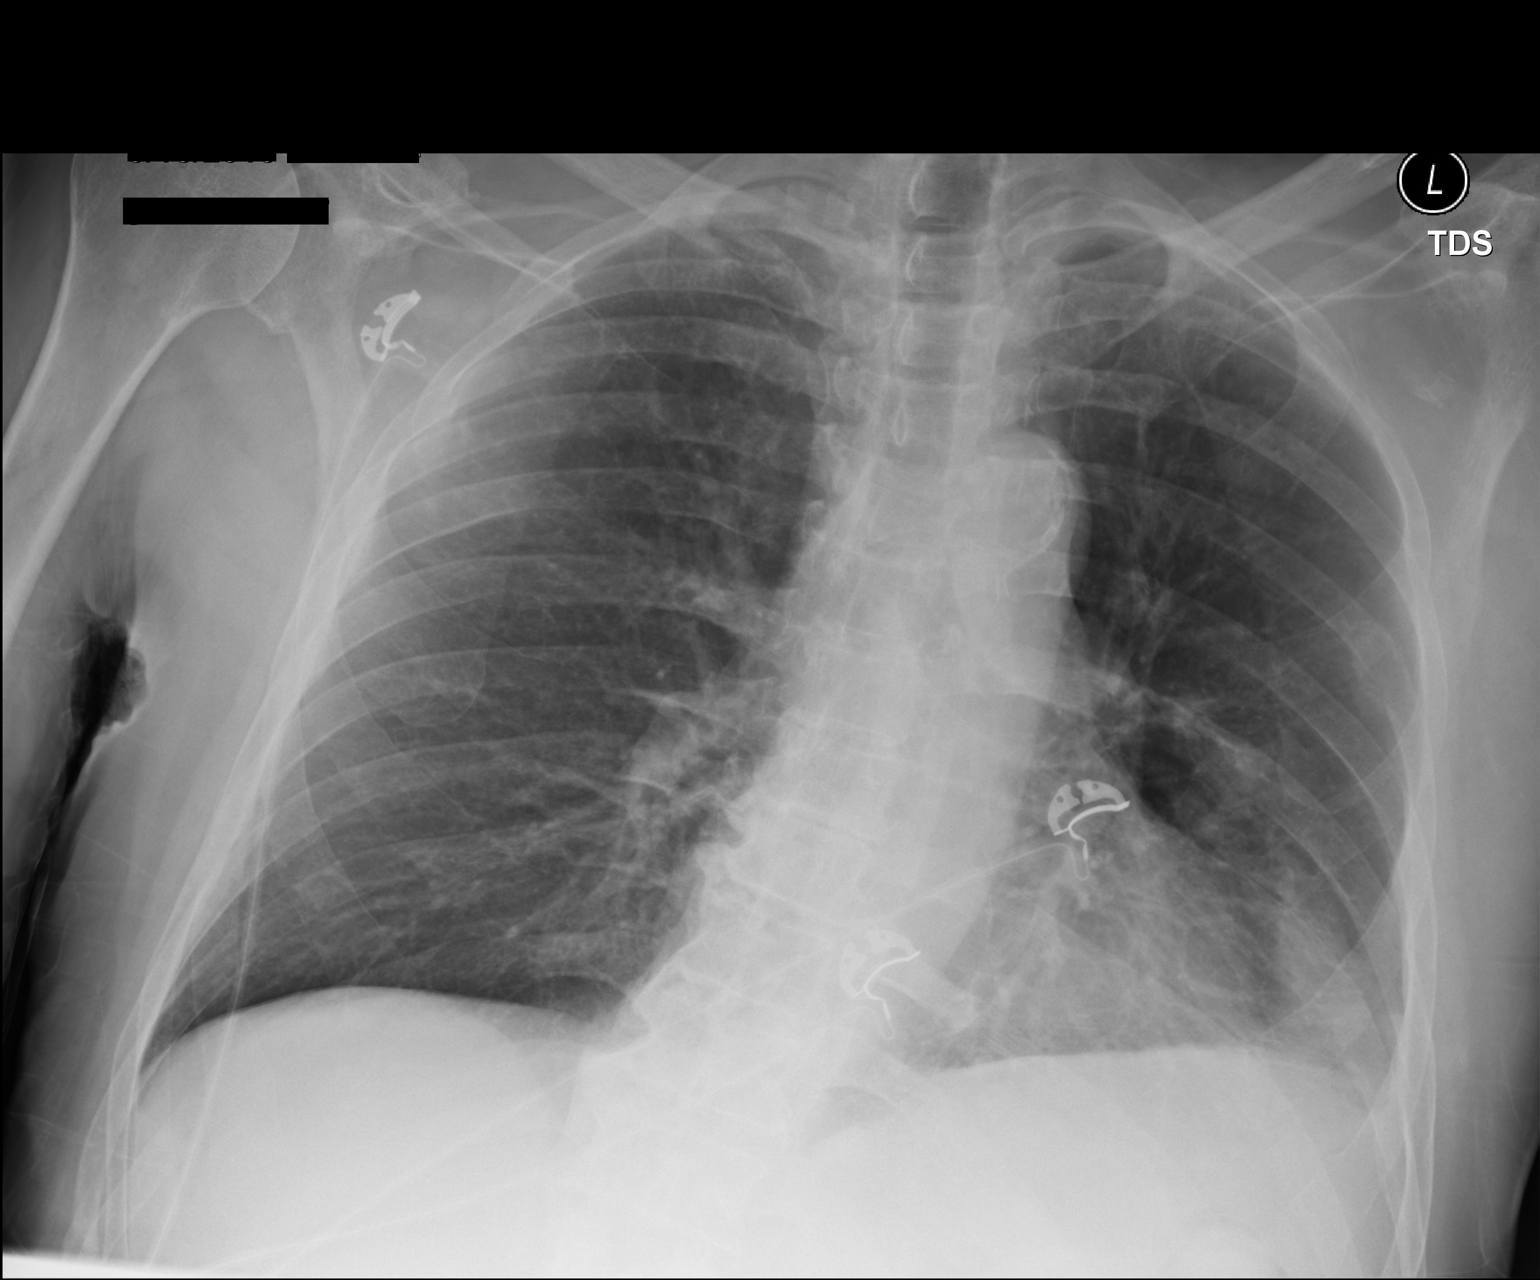

[1 of 1 positions shown; findings below may reference images not displayed]

FINDINGS: 1396 hours.  The heart size and mediastinal contours are
stable.  There is stable pleural parenchymal scarring at the left
costophrenic angle..  No airspace disease or significant pleural
effusion is identified.  A lucent lesion and associated pathologic
fracture of the left fifth rib appear unchanged.  No new fractures
are identified.
IMPRESSION: Stable pleural parenchymal scarring in the left hemithorax.  No
acute process identified.

## 2020-01-02 ENCOUNTER — Telehealth: Payer: Self-pay | Admitting: *Deleted

## 2020-01-02 NOTE — Telephone Encounter (Signed)
Patient called and thought he had an upcoming appointment on [place]. Dec. 9th. Did not see anything in his chart. Please call to schedule.
